# Patient Record
Sex: Female | Born: 1940 | Race: White | Hispanic: No | Smoking: Never smoker
Health system: Southern US, Community
[De-identification: ages and names within clinical notes are randomized; demographics above are authoritative.]

## PROBLEM LIST (undated history)

## (undated) DIAGNOSIS — R269 Unspecified abnormalities of gait and mobility: Secondary | ICD-10-CM

## (undated) DIAGNOSIS — H539 Unspecified visual disturbance: Secondary | ICD-10-CM

## (undated) DIAGNOSIS — M1990 Unspecified osteoarthritis, unspecified site: Secondary | ICD-10-CM

## (undated) DIAGNOSIS — M75100 Unspecified rotator cuff tear or rupture of unspecified shoulder, not specified as traumatic: Secondary | ICD-10-CM

## (undated) DIAGNOSIS — I639 Cerebral infarction, unspecified: Secondary | ICD-10-CM

## (undated) DIAGNOSIS — D649 Anemia, unspecified: Secondary | ICD-10-CM

## (undated) HISTORY — DX: Unspecified visual disturbance: H53.9

## (undated) HISTORY — DX: Cerebral infarction, unspecified: I63.9

## (undated) HISTORY — PX: ORIF CLAVICULAR FRACTURE: SHX5055

---

## 2007-07-02 ENCOUNTER — Encounter: Admission: RE | Admit: 2007-07-02 | Discharge: 2007-07-02 | Payer: Self-pay | Admitting: Obstetrics and Gynecology

## 2008-07-02 ENCOUNTER — Encounter: Admission: RE | Admit: 2008-07-02 | Discharge: 2008-07-02 | Payer: Self-pay | Admitting: Obstetrics and Gynecology

## 2009-07-04 ENCOUNTER — Encounter: Admission: RE | Admit: 2009-07-04 | Discharge: 2009-07-04 | Payer: Self-pay | Admitting: Obstetrics and Gynecology

## 2010-06-02 ENCOUNTER — Other Ambulatory Visit: Payer: Self-pay | Admitting: Obstetrics and Gynecology

## 2010-06-02 DIAGNOSIS — Z1231 Encounter for screening mammogram for malignant neoplasm of breast: Secondary | ICD-10-CM

## 2010-07-06 ENCOUNTER — Ambulatory Visit
Admission: RE | Admit: 2010-07-06 | Discharge: 2010-07-06 | Disposition: A | Discharge status: Home / Self Care | Payer: Medicare Other | Source: Ambulatory Visit | Attending: Obstetrics and Gynecology | Admitting: Obstetrics and Gynecology

## 2010-07-06 DIAGNOSIS — Z1231 Encounter for screening mammogram for malignant neoplasm of breast: Secondary | ICD-10-CM

## 2011-06-12 ENCOUNTER — Other Ambulatory Visit: Payer: Self-pay | Admitting: Obstetrics and Gynecology

## 2011-06-12 DIAGNOSIS — Z1231 Encounter for screening mammogram for malignant neoplasm of breast: Secondary | ICD-10-CM

## 2011-07-10 ENCOUNTER — Ambulatory Visit
Admission: RE | Admit: 2011-07-10 | Discharge: 2011-07-10 | Disposition: A | Discharge status: Home / Self Care | Payer: Medicare Other | Source: Ambulatory Visit | Attending: Obstetrics and Gynecology | Admitting: Obstetrics and Gynecology

## 2011-07-10 DIAGNOSIS — Z1231 Encounter for screening mammogram for malignant neoplasm of breast: Secondary | ICD-10-CM

## 2012-07-30 ENCOUNTER — Other Ambulatory Visit: Payer: Self-pay

## 2012-07-30 DIAGNOSIS — Z1231 Encounter for screening mammogram for malignant neoplasm of breast: Secondary | ICD-10-CM

## 2012-09-03 ENCOUNTER — Ambulatory Visit
Admission: RE | Admit: 2012-09-03 | Discharge: 2012-09-03 | Disposition: A | Discharge status: Home / Self Care | Payer: Medicare Other | Source: Ambulatory Visit

## 2012-09-03 DIAGNOSIS — Z1231 Encounter for screening mammogram for malignant neoplasm of breast: Secondary | ICD-10-CM

## 2013-08-28 ENCOUNTER — Other Ambulatory Visit: Payer: Self-pay

## 2013-08-28 DIAGNOSIS — Z1231 Encounter for screening mammogram for malignant neoplasm of breast: Secondary | ICD-10-CM

## 2013-09-11 ENCOUNTER — Ambulatory Visit: Payer: Medicare Other

## 2013-09-17 ENCOUNTER — Ambulatory Visit
Admission: RE | Admit: 2013-09-17 | Discharge: 2013-09-17 | Disposition: A | Discharge status: Home / Self Care | Payer: Medicare Other | Source: Ambulatory Visit

## 2013-09-17 DIAGNOSIS — Z1231 Encounter for screening mammogram for malignant neoplasm of breast: Secondary | ICD-10-CM

## 2014-08-23 ENCOUNTER — Other Ambulatory Visit: Payer: Self-pay

## 2014-08-23 DIAGNOSIS — Z1231 Encounter for screening mammogram for malignant neoplasm of breast: Secondary | ICD-10-CM

## 2014-09-21 ENCOUNTER — Ambulatory Visit
Admission: RE | Admit: 2014-09-21 | Discharge: 2014-09-21 | Disposition: A | Discharge status: Home / Self Care | Payer: Medicare Other | Source: Ambulatory Visit

## 2014-09-21 DIAGNOSIS — Z1231 Encounter for screening mammogram for malignant neoplasm of breast: Secondary | ICD-10-CM

## 2015-09-27 ENCOUNTER — Other Ambulatory Visit: Payer: Self-pay

## 2015-09-27 ENCOUNTER — Other Ambulatory Visit: Payer: Self-pay | Admitting: Family Medicine

## 2015-09-27 DIAGNOSIS — Z1231 Encounter for screening mammogram for malignant neoplasm of breast: Secondary | ICD-10-CM

## 2015-10-10 ENCOUNTER — Ambulatory Visit
Admission: RE | Admit: 2015-10-10 | Discharge: 2015-10-10 | Disposition: A | Discharge status: Home / Self Care | Payer: Medicare Other | Source: Ambulatory Visit | Attending: Family Medicine | Admitting: Family Medicine

## 2015-10-10 DIAGNOSIS — Z1231 Encounter for screening mammogram for malignant neoplasm of breast: Secondary | ICD-10-CM

## 2016-10-15 ENCOUNTER — Other Ambulatory Visit: Payer: Self-pay | Admitting: Family Medicine

## 2016-10-15 DIAGNOSIS — Z1231 Encounter for screening mammogram for malignant neoplasm of breast: Secondary | ICD-10-CM

## 2016-10-25 ENCOUNTER — Ambulatory Visit: Payer: Medicare Other

## 2016-10-30 ENCOUNTER — Ambulatory Visit
Admission: RE | Admit: 2016-10-30 | Discharge: 2016-10-30 | Disposition: A | Discharge status: Home / Self Care | Payer: Medicare Other | Source: Ambulatory Visit | Attending: Family Medicine | Admitting: Family Medicine

## 2016-10-30 DIAGNOSIS — Z1231 Encounter for screening mammogram for malignant neoplasm of breast: Secondary | ICD-10-CM

## 2016-10-31 ENCOUNTER — Other Ambulatory Visit: Payer: Self-pay | Admitting: Family Medicine

## 2016-10-31 DIAGNOSIS — R928 Other abnormal and inconclusive findings on diagnostic imaging of breast: Secondary | ICD-10-CM

## 2016-11-02 ENCOUNTER — Ambulatory Visit: Payer: Medicare Other

## 2016-11-02 ENCOUNTER — Ambulatory Visit
Admission: RE | Admit: 2016-11-02 | Discharge: 2016-11-02 | Disposition: A | Discharge status: Home / Self Care | Payer: Medicare Other | Source: Ambulatory Visit | Attending: Family Medicine | Admitting: Family Medicine

## 2016-11-02 DIAGNOSIS — R928 Other abnormal and inconclusive findings on diagnostic imaging of breast: Secondary | ICD-10-CM

## 2016-11-05 ENCOUNTER — Other Ambulatory Visit: Payer: Medicare Other

## 2016-11-06 ENCOUNTER — Other Ambulatory Visit: Payer: Medicare Other

## 2017-01-04 ENCOUNTER — Other Ambulatory Visit: Payer: Self-pay | Admitting: Physician Assistant

## 2017-01-04 DIAGNOSIS — M8580 Other specified disorders of bone density and structure, unspecified site: Secondary | ICD-10-CM

## 2017-01-28 ENCOUNTER — Ambulatory Visit
Admission: RE | Admit: 2017-01-28 | Discharge: 2017-01-28 | Disposition: A | Discharge status: Home / Self Care | Payer: Medicare Other | Source: Ambulatory Visit | Attending: Physician Assistant | Admitting: Physician Assistant

## 2017-01-28 DIAGNOSIS — M8580 Other specified disorders of bone density and structure, unspecified site: Secondary | ICD-10-CM

## 2017-10-17 ENCOUNTER — Ambulatory Visit (INDEPENDENT_AMBULATORY_CARE_PROVIDER_SITE_OTHER): Payer: Medicare Other | Admitting: Orthopaedic Surgery

## 2017-10-17 ENCOUNTER — Ambulatory Visit (INDEPENDENT_AMBULATORY_CARE_PROVIDER_SITE_OTHER): Payer: Medicare Other

## 2017-10-17 ENCOUNTER — Encounter (INDEPENDENT_AMBULATORY_CARE_PROVIDER_SITE_OTHER): Payer: Self-pay | Admitting: Orthopaedic Surgery

## 2017-10-17 DIAGNOSIS — M79621 Pain in right upper arm: Secondary | ICD-10-CM | POA: Diagnosis not present

## 2017-10-17 DIAGNOSIS — M898X2 Other specified disorders of bone, upper arm: Secondary | ICD-10-CM

## 2017-10-17 DIAGNOSIS — S42254D Nondisplaced fracture of greater tuberosity of right humerus, subsequent encounter for fracture with routine healing: Secondary | ICD-10-CM | POA: Diagnosis not present

## 2017-10-17 MED ORDER — LIDOCAINE HCL 1 % IJ SOLN
3.0000 mL | INTRAMUSCULAR | Status: AC | PRN
  Administered 2017-10-17: 3 mL

## 2017-10-17 MED ORDER — METHYLPREDNISOLONE ACETATE 40 MG/ML IJ SUSP
40.0000 mg | INTRAMUSCULAR | Status: AC | PRN
  Administered 2017-10-17: 40 mg via INTRA_ARTICULAR

## 2017-10-17 NOTE — Progress Notes (Signed)
Office Visit Note   Patient: Sarah Page           Date of Birth: December 04, 1940           MRN: [id number] Visit Date: 10/17/2017              Requested by: Selinda Flavin, MD [address] [place], [place] [address] PCP: Selinda Flavin, MD   Assessment & Plan: Visit Diagnoses:  1. Pain of right humerus   2. Closed nondisplaced fracture of greater tuberosity of right humerus with routine healing, subsequent encounter     Plan: At this point it has been 4 weeks since her injury and I do feel that it is appropriate for her to be out of the sling at this standpoint.  I want her to work on shoulder rotation but no significant abduction past 90 degrees.  She will work on [employer] well.  At this point I feel that is appropriate that we set her up for outpatient physical therapy to work on increasing her shoulder mobility as well.  I agree with her not needing any type of surgery until at least going through further healing of the greater tuberosity impaction fracture and therapy.  I do feel that she would benefit from a subacromial injection because she is has already gone through the inflammatory stage of healing I think this could help with her pain and should agree with trying something like this as well.  All questions concerns were answered and addressed.  I will see her back in 4 weeks and I would like an AP and axillary view for 2 views of the right shoulder at that visit.  All question concerns were answered and addressed.  Follow-Up Instructions: Return in about 1 month (around 11/14/2017).   Orders:  Orders Placed This Encounter  Procedures  . Large Joint Inj  . XR Humerus Right   No orders of the defined types were placed in this encounter.     Procedures: Large Joint Inj: R subacromial bursa on 10/17/2017 5:08 PM Indications: pain and diagnostic evaluation Details: 22 G 1.5 in needle  Arthrogram: No  Medications: 3 mL lidocaine 1 %; 40 mg methylPREDNISolone acetate  40 MG/ML Outcome: tolerated well, no immediate complications Procedure, treatment alternatives, risks and benefits explained, specific risks discussed. Consent was given by the patient. Immediately prior to procedure a time out was called to verify the correct patient, procedure, equipment, support staff and site/side marked as required. Patient was prepped and draped in the usual sterile fashion.       Clinical Data: No additional findings.   Subjective: Chief Complaint  Patient presents with  . Right Shoulder - Pain  The patient is a very pleasant 77 year old active right-hand dominant female who comes for second opinion as it relates to her right shoulder.  Her son-in-law is a cardiologist who I know and asked if I could see her.  She had an unfortunate mechanical fall when she was outside working in yard about a month ago.  She landed on that right shoulder and apparently sustained an impaction fracture that was nondisplaced to the right shoulder greater tuberosity.  There is likely rotator cuff tear as well.  According to the patient she was told to follow-up around August of this year did not consider surgery on her shoulder because she would need time to heal the greater tuberosity fracture.  I did tell her that that is somewhat appropriate given the fact that  surgery is not warranted on her rotator cuff until there is evidence that the greater tuberosity fracture is completely healed.  She has been in a sling for the last month and has had limited mobility of that shoulder.  She complains of mainly pain in the mid humerus and not so much the shoulder but she is concerned about her limited mobility of that shoulder at this point.  She denies any neck pain denies any numbness and tingling in her hand.  She is very active individual and this is certainly concerning for her.  I do have the x-ray report for my review and she knows that I would like to get a picture of her shoulder today as well.   Her daughter is with her as well.   HPI  Review of Systems She denies any active medical problems.  Objective: Vital Signs: There were no vitals taken for this visit.  Physical Exam She is alert and oriented x3 and in no acute distress Ortho Exam Examination of her right shoulder shows no muscle atrophy.  Her shoulder is well located.  She has significant deficits with shoulder abduction and external rotation with weakness in the shoulder as well. Specialty Comments:  No specialty comments available.  Imaging: Xr Humerus Right  Result Date: 10/17/2017 A single x-ray of the right humerus shows the shoulder is well located.  I do feel that the humeral head is slightly high riding.  He cannot see the greater tuberosity fracture that is seen on MRI.    PMFS History: Patient Active Problem List   Diagnosis Date Noted  . Nondisplaced fracture of greater tuberosity of right humerus with routine healing 10/17/2017   History reviewed. No pertinent past medical history.  Family History  Problem Relation Age of Onset  . Breast cancer Neg Hx     History reviewed. No pertinent surgical history. Social History   Occupational History  . Not on file  Tobacco Use  . Smoking status: Never Smoker  . Smokeless tobacco: Never Used  Substance and Sexual Activity  . Alcohol use: Not on file  . Drug use: Not on file  . Sexual activity: Not on file

## 2017-11-14 ENCOUNTER — Ambulatory Visit (INDEPENDENT_AMBULATORY_CARE_PROVIDER_SITE_OTHER): Payer: Medicare Other | Admitting: Orthopaedic Surgery

## 2017-11-14 ENCOUNTER — Encounter (INDEPENDENT_AMBULATORY_CARE_PROVIDER_SITE_OTHER): Payer: Self-pay | Admitting: Orthopaedic Surgery

## 2017-11-14 ENCOUNTER — Ambulatory Visit (INDEPENDENT_AMBULATORY_CARE_PROVIDER_SITE_OTHER): Payer: Medicare Other

## 2017-11-14 DIAGNOSIS — S42254D Nondisplaced fracture of greater tuberosity of right humerus, subsequent encounter for fracture with routine healing: Secondary | ICD-10-CM

## 2017-11-14 NOTE — Progress Notes (Signed)
The patient is now about 8 weeks status post injury to her right shoulder after mechanical fall.  She sustained a right proximal humerus fracture mainly involving the greater tuberosity of the shoulder.  This is follow-up for her at this point.  With that her out of sling trying to increase the activities of her right shoulder with increasing her mobility of that shoulder.  She is 77 years old.  Her injury was more significant and the fact that she had an anterior shoulder dislocation as well.  Her injury was also more of a rotator cuff tear.  She is had 3 physical therapy sessions and she reports minimal pain.  On examination her shoulder is clinically located.  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  Distally her motor and sensory exam is normal.  2 views of her right shoulder obtained and showed a fracture is consolidating nicely and the shoulder is well located.  At this point therapy can aggressively work on attempts of full motion of her right shoulder and strengthening.  I will see her back in 4 weeks to see how she is doing overall.  I would like a single AP view of her shoulder at that visit of her right shoulder at that visit.

## 2017-12-10 ENCOUNTER — Other Ambulatory Visit: Payer: Self-pay | Admitting: Family Medicine

## 2017-12-10 DIAGNOSIS — Z1231 Encounter for screening mammogram for malignant neoplasm of breast: Secondary | ICD-10-CM

## 2017-12-16 ENCOUNTER — Encounter (INDEPENDENT_AMBULATORY_CARE_PROVIDER_SITE_OTHER): Payer: Self-pay | Admitting: Orthopaedic Surgery

## 2017-12-16 ENCOUNTER — Ambulatory Visit (INDEPENDENT_AMBULATORY_CARE_PROVIDER_SITE_OTHER): Payer: Medicare Other | Admitting: Orthopaedic Surgery

## 2017-12-16 ENCOUNTER — Ambulatory Visit (INDEPENDENT_AMBULATORY_CARE_PROVIDER_SITE_OTHER): Payer: Medicare Other

## 2017-12-16 DIAGNOSIS — S42254D Nondisplaced fracture of greater tuberosity of right humerus, subsequent encounter for fracture with routine healing: Secondary | ICD-10-CM | POA: Diagnosis not present

## 2017-12-16 NOTE — Progress Notes (Signed)
Patient is a very active right-hand-dominant 77 year old who is been recovering from a greater tuberosity fracture that was nondisplaced of her right shoulder.  She also has a deficit of the rotator cuff.  She is been going to physical therapy.  She has no discomfort and minimal pain with her shoulder but her limitations have been in function of the shoulder in general.  This is mainly with overhead activities and activities of daily living.  She would like to be able to easily put on a necklace or blood draw here.  On examination passively her range of motion is full actively there is still significant limitations with right shoulder abduction and external rotation with significant weakness of the rotator cuff.  An AP single view of the right shoulder is obtained and shows the greater tuberosity fracture is healed.  The humeral head is not slightly a superior position in the glenoid showing rotator cuff deficits.  This point we will not try at least one more month of physical therapy.  I cannot guarantee that an arthroscopic intervention could improve her outcome.  However, we may consider arthroscopy after seeing how she looks at her next visit.

## 2018-01-08 ENCOUNTER — Ambulatory Visit
Admission: RE | Admit: 2018-01-08 | Discharge: 2018-01-08 | Disposition: A | Discharge status: Home / Self Care | Payer: Medicare Other | Source: Ambulatory Visit | Attending: Family Medicine | Admitting: Family Medicine

## 2018-01-08 DIAGNOSIS — Z1231 Encounter for screening mammogram for malignant neoplasm of breast: Secondary | ICD-10-CM

## 2018-01-13 ENCOUNTER — Encounter (INDEPENDENT_AMBULATORY_CARE_PROVIDER_SITE_OTHER): Payer: Self-pay | Admitting: Orthopaedic Surgery

## 2018-01-13 ENCOUNTER — Ambulatory Visit (INDEPENDENT_AMBULATORY_CARE_PROVIDER_SITE_OTHER): Payer: Medicare Other | Admitting: Orthopaedic Surgery

## 2018-01-13 DIAGNOSIS — S42254D Nondisplaced fracture of greater tuberosity of right humerus, subsequent encounter for fracture with routine healing: Secondary | ICD-10-CM

## 2018-01-13 NOTE — Progress Notes (Signed)
   Office Visit Note   Patient: Sarah Page           Date of Birth: [birth year]           MRN: [id number] Visit Date: 01/13/2018              Requested by: Selinda Flavin, MD [address] [place], [place] [address] PCP: Selinda Flavin, MD   Assessment & Plan: Visit Diagnoses:  1. Closed nondisplaced fracture of greater tuberosity of right humerus with routine healing, subsequent encounter     Plan: She will continue to work on range of motion strengthening of the right shoulder.  New prescription for physical therapy to continue to work on range of motion strengthening is given.  She does not want any type of surgical intervention at this point time I feel this is appropriate as she is making slow steady progress.  We will see her back in 3 months no radiographs at that time.  Follow-Up Instructions: Return in about 3 months (around 04/14/2018).   Orders:  No orders of the defined types were placed in this encounter.  No orders of the defined types were placed in this encounter.     Procedures: No procedures performed   Clinical Data: No additional findings.   Subjective: Chief Complaint  Patient presents with  . Right Shoulder - Follow-up    HPI Sarah Page returns today follow-up of her right shoulder nondisplaced greater tuberosity fracture.  She states she is making slow progress with physical therapy.  She feels therapy is definitely been helpful.  She has weakness of the right arm and has difficulty lifting anything with the arm..  Again she fractured her right shoulder in early June.  Review of Systems   Objective: Vital Signs: There were no vitals taken for this visit.  Physical Exam  Constitutional: She is oriented to person, place, and time. She appears well-developed and well-nourished. No distress.  Pulmonary/Chest: Effort normal.  Neurological: She is alert and oriented to person, place, and time.  Skin: She is not diaphoretic.    Ortho Exam Forward flexion  actively 100 degrees passively and bring him to approximately 150 to 160 degrees.  She has weakness with external rotation right shoulder against resistance 5 out of 5 strength with internal rotation against resistance.  Negative impingement testing on the right. Specialty Comments:  No specialty comments available.  Imaging: No results found.   PMFS History: Patient Active Problem List   Diagnosis Date Noted  . Nondisplaced fracture of greater tuberosity of right humerus with routine healing 10/17/2017   No past medical history on file.  Family History  Problem Relation Age of Onset  . Breast cancer Neg Hx     No past surgical history on file. Social History   Occupational History  . Not on file  Tobacco Use  . Smoking status: Never Smoker  . Smokeless tobacco: Never Used  Substance and Sexual Activity  . Alcohol use: Not on file  . Drug use: Not on file  . Sexual activity: Not on file

## 2018-03-07 ENCOUNTER — Encounter (HOSPITAL_COMMUNITY): Payer: Self-pay

## 2018-03-07 ENCOUNTER — Emergency Department (HOSPITAL_COMMUNITY): Payer: Medicare Other

## 2018-03-07 ENCOUNTER — Emergency Department (HOSPITAL_COMMUNITY)
Admission: EM | Admit: 2018-03-07 | Discharge: 2018-03-07 | Disposition: A | Discharge status: Home / Self Care | Payer: Medicare Other | Attending: Emergency Medicine | Admitting: Emergency Medicine

## 2018-03-07 ENCOUNTER — Other Ambulatory Visit: Payer: Self-pay

## 2018-03-07 DIAGNOSIS — Z79899 Other long term (current) drug therapy: Secondary | ICD-10-CM | POA: Diagnosis not present

## 2018-03-07 DIAGNOSIS — N390 Urinary tract infection, site not specified: Secondary | ICD-10-CM | POA: Insufficient documentation

## 2018-03-07 DIAGNOSIS — R5383 Other fatigue: Secondary | ICD-10-CM | POA: Diagnosis present

## 2018-03-07 HISTORY — DX: Unspecified osteoarthritis, unspecified site: M19.90

## 2018-03-07 HISTORY — DX: Anemia, unspecified: D64.9

## 2018-03-07 HISTORY — DX: Unspecified rotator cuff tear or rupture of unspecified shoulder, not specified as traumatic: M75.100

## 2018-03-07 LAB — I-STAT TROPONIN, ED: Troponin i, poc: 0.03 ng/mL (ref 0.00–0.08)

## 2018-03-07 LAB — BASIC METABOLIC PANEL
ANION GAP: 8 (ref 5–15)
BUN: 18 mg/dL (ref 8–23)
CALCIUM: 9.2 mg/dL (ref 8.9–10.3)
CO2: 27 mmol/L (ref 22–32)
Chloride: 107 mmol/L (ref 98–111)
Creatinine, Ser: 0.98 mg/dL (ref 0.44–1.00)
GFR, EST NON AFRICAN AMERICAN: 55 mL/min — AB (ref >60)
Glucose, Bld: 132 mg/dL — ABNORMAL HIGH (ref 70–99)
Potassium: 3.8 mmol/L (ref 3.5–5.1)
Sodium: 142 mmol/L (ref 135–145)

## 2018-03-07 LAB — URINALYSIS, ROUTINE W REFLEX MICROSCOPIC
Bilirubin Urine: NEGATIVE
GLUCOSE, UA: NEGATIVE mg/dL
HGB URINE DIPSTICK: NEGATIVE
KETONES UR: NEGATIVE mg/dL
Nitrite: NEGATIVE
PROTEIN: NEGATIVE mg/dL
Specific Gravity, Urine: 1.006 (ref 1.005–1.030)
pH: 7 (ref 5.0–8.0)

## 2018-03-07 LAB — CBC
HCT: 37.4 % (ref 36.0–46.0)
Hemoglobin: 11.2 g/dL — ABNORMAL LOW (ref 12.0–15.0)
MCH: 27.3 pg (ref 26.0–34.0)
MCHC: 29.9 g/dL — ABNORMAL LOW (ref 30.0–36.0)
MCV: 91 fL (ref 80.0–100.0)
NRBC: 0 % (ref 0.0–0.2)
Platelets: 372 10*3/uL (ref 150–400)
RBC: 4.11 MIL/uL (ref 3.87–5.11)
RDW: 15.1 % (ref 11.5–15.5)
WBC: 8.7 10*3/uL (ref 4.0–10.5)

## 2018-03-07 LAB — CBG MONITORING, ED: Glucose-Capillary: 138 mg/dL — ABNORMAL HIGH (ref 70–99)

## 2018-03-07 LAB — TROPONIN I: Troponin I: <0.03 ng/mL (ref <0.03)

## 2018-03-07 MED ORDER — SODIUM CHLORIDE 0.9 % IV BOLUS (SEPSIS)
500.0000 mL | Freq: Once | INTRAVENOUS | Status: AC
  Administered 2018-03-07: 500 mL via INTRAVENOUS

## 2018-03-07 MED ORDER — SODIUM CHLORIDE 0.9 % IV SOLN
1.0000 g | Freq: Once | INTRAVENOUS | Status: AC
  Administered 2018-03-07: 1 g via INTRAVENOUS
  Filled 2018-03-07: qty 10

## 2018-03-07 MED ORDER — SODIUM CHLORIDE 0.9 % IV SOLN
1000.0000 mL | INTRAVENOUS | Status: DC
  Administered 2018-03-07: 1000 mL via INTRAVENOUS

## 2018-03-07 NOTE — ED Provider Notes (Signed)
[facility] EMERGENCY DEPARTMENT Provider Note   CSN: [id number] Arrival date & time: 03/07/18  1643     History   Chief Complaint Chief Complaint  Patient presents with  . Urinary Tract Infection  . Fatigue    HPI Sarah Page is a 77 y.o. female.  HPI Pt presents to the ED for evaluation of fatigue and generalized weakness.  Pt almost  fell when she has been standing up a couple of times today.  She was also in the shower yesterday and had difficulty getting up when she was leaning forward.  Patient feels that both of her legs are weak.  She denies any trouble with extremity pain, vision issues or speech issues.  She denies any trouble with chest pain or shortness of breath.  No abdominal pain.  No blood in her stool.  Patient saw her doctor a couple of days ago and was diagnosed with UTI and was given a prescription for amoxicillin.  She denies any fevers. Past Medical History:  Diagnosis Date  . Anemia   . Arthritis   . Rotator cuff tear    DR [facility]    Patient Active Problem List   Diagnosis Date Noted  . Nondisplaced fracture of greater tuberosity of right humerus with routine healing 10/17/2017    History reviewed. No pertinent surgical history.   OB History   None      Home Medications    Prior to Admission medications   Medication Sig Start Date End Date Taking? Authorizing Provider  amoxicillin (AMOXIL) 500 MG capsule Take 1,000 mg by mouth 2 (two) times daily. 7 day course starting on 03/06/2018 03/06/18  Yes [provider]  Calcium Carbonate (CALCARB 600 PO) Take 1 tablet by mouth every morning.   Yes [provider]  Cholecalciferol (VITAMIN D3 PO) Take 1 capsule by mouth every morning.   Yes [provider]  ferrous sulfate 325 (65 FE) MG tablet Take 325 mg by mouth daily with breakfast.   Yes [provider]  KRILL OIL PO Take 1 capsule by mouth every morning.   Yes [provider]  loratadine  (CLARITIN) 10 MG tablet Take 10 mg by mouth every morning.   Yes [provider]  Multiple Vitamin (MULTIVITAMIN WITH MINERALS) TABS tablet Take 1 tablet by mouth every morning.   Yes [provider]  naproxen sodium (ALEVE) 220 MG tablet Take 440 mg by mouth 2 (two) times daily as needed (FOR PAIN).   Yes [provider]    Family History Family History  Problem Relation Age of Onset  . Breast cancer Neg Hx     Social History Social History   Tobacco Use  . Smoking status: Never Smoker  . Smokeless tobacco: Never Used  Substance Use Topics  . Alcohol use: Yes    Comment: ONE GLASS DAILY  . Drug use: Never     Allergies   Patient has no known allergies.   Review of Systems Review of Systems  All other systems reviewed and are negative.    Physical Exam Updated Vital Signs BP (!) 143/77 (BP Location: Left Arm)   Pulse 75   Temp 98 F (36.7 C)   Resp 18   Ht 1.651 m (5\' 5" )   Wt 70.3 kg   SpO2 96%   BMI 25.79 kg/m   Physical Exam  Constitutional: She is oriented to person, place, and time. She appears well-developed and well-nourished. No distress.  HENT:  Head:  Normocephalic and atraumatic.  Right Ear: External ear normal.  Left Ear: External ear normal.  Mouth/Throat: Oropharynx is clear and moist.  Eyes: Conjunctivae are normal. Right eye exhibits no discharge. Left eye exhibits no discharge. No scleral icterus.  Neck: Neck supple. No tracheal deviation present.  Cardiovascular: Normal rate, regular rhythm and intact distal pulses.  Pulmonary/Chest: Effort normal and breath sounds normal. No stridor. No respiratory distress. She has no wheezes. She has no rales.  Abdominal: Soft. Bowel sounds are normal. She exhibits no distension. There is no tenderness. There is no rebound and no guarding.  Musculoskeletal: She exhibits no edema or tenderness.  Neurological: She is alert and oriented to person, place, and time. She has normal  strength. No cranial nerve deficit (No facial droop, extraocular movements intact, tongue midline ) or sensory deficit. She exhibits normal muscle tone. She displays no seizure activity. Coordination normal.  No pronator drift bilateral upper extrem, able to hold both legs off bed for 5 seconds, sensation intact in all extremities, no visual field cuts, no left or right sided neglect, normal finger-nose exam bilaterally, no nystagmus noted   Skin: Skin is warm and dry. No rash noted.  Psychiatric: She has a normal mood and affect.  Nursing note and vitals reviewed.    ED Treatments / Results  Labs (all labs ordered are listed, but only abnormal results are displayed) Labs Reviewed  BASIC METABOLIC PANEL - Abnormal; Notable for the following components:      Result Value   Glucose, Bld 132 (*)    GFR calc non Af Amer 55 (*)    All other components within normal limits  CBC - Abnormal; Notable for the following components:   Hemoglobin 11.2 (*)    MCHC 29.9 (*)    All other components within normal limits  URINALYSIS, ROUTINE W REFLEX MICROSCOPIC - Abnormal; Notable for the following components:   Color, Urine STRAW (*)    Leukocytes, UA MODERATE (*)    Bacteria, UA RARE (*)    All other components within normal limits  CBG MONITORING, ED - Abnormal; Notable for the following components:   Glucose-Capillary 138 (*)    All other components within normal limits  TROPONIN I  I-STAT TROPONIN, ED    EKG EKG Interpretation  Date/Time:  Friday March 07 2018 17:15:52 EST Ventricular Rate:  87 PR Interval:    QRS Duration: 83 QT Interval:  369 QTC Calculation: 444 R Axis:   50 Text Interpretation:  Sinus rhythm Minimal ST depression, lateral leads Baseline wander in lead(s) V3 No old tracing to compare Confirmed by Linwood Dibbles [id number]) on 03/07/2018 5:22:10 PM   Radiology Dg Chest 2 View  Result Date: 03/07/2018 CLINICAL DATA:  Weakness, fall EXAM: CHEST - 2 VIEW COMPARISON:   None. FINDINGS: Lungs are essentially clear. Mild left basilar opacity, likely atelectasis. No pleural effusion or pneumothorax. The heart is normal in size. Moderate hiatal hernia. Visualized osseous structures are within normal limits. IMPRESSION: No evidence of acute cardiopulmonary disease. Electronically Signed   By: Charline Bills M.D.   On: 03/07/2018 19:38    Procedures Procedures (including critical care time)  Medications Ordered in ED Medications  sodium chloride 0.9 % bolus 500 mL (0 mLs Intravenous Stopped 03/07/18 1818)    Followed by  0.9 %  sodium chloride infusion (1,000 mLs Intravenous New Bag/Given 03/07/18 1747)  cefTRIAXone (ROCEPHIN) 1 g in sodium chloride 0.9 % 100 mL IVPB (1 g Intravenous New Bag/Given 03/07/18  2037)     Initial Impression / Assessment and Plan / ED Course  I have reviewed the triage vital signs and the nursing notes.  Pertinent labs & imaging results that were available during my care of the patient were reviewed by me and considered in my medical decision making (see chart for details).   Patient presented to the emergency room with complaints of generalized weakness.  She was recently diagnosed with urinary tract infection.  In the ED the patient's vital signs were reassuring.  She had a normal neurologic exam without any focal deficits.  Initially she was hypertensive but subsequent blood pressures were more normotensive.  She was not orthostatic.  Lab tests do show urinalysis consistent with urinary tract infection.  No signs of severe dehydration or anemia.  Patient is currently on antibiotics.  She has a urine culture pending that per Dr. said would be available tomorrow.  I covered her with a dose of Rocephin until her sensitivities come back.  Patient was able to walk around the emergency room without difficulty.  She appears stable for discharge.  Final Clinical Impressions(s) / ED Diagnoses   Final diagnoses:  Lower urinary tract  infectious disease    ED Discharge Orders    None       Linwood Dibbles, MD 03/07/18 2055

## 2018-03-07 NOTE — Discharge Instructions (Addendum)
Continue your antibiotics, make sure to stay hydrated, return for fever, worsening symptoms

## 2018-03-07 NOTE — ED Triage Notes (Signed)
Pt went to DR [facility] due to urinary frequency and was given amoxicillin. Pt reports that she was in the shower yesterday and leaned forward and had difficulty getting up. Pt reports that she has fallen twice today and feels very weak.

## 2018-03-07 NOTE — ED Notes (Signed)
Pt wheeled to waiting room. Pt verbalized understanding of discharge instructions.   

## 2018-03-10 ENCOUNTER — Encounter: Payer: Self-pay | Admitting: Neurology

## 2018-03-12 ENCOUNTER — Ambulatory Visit: Payer: Medicare Other | Admitting: Neurology

## 2018-03-12 ENCOUNTER — Encounter: Payer: Self-pay | Admitting: Neurology

## 2018-03-12 ENCOUNTER — Other Ambulatory Visit: Payer: Self-pay

## 2018-03-12 VITALS — BP 150/70 | HR 80 | Resp 16 | Ht 65.0 in | Wt 140.0 lb

## 2018-03-12 DIAGNOSIS — I63031 Cerebral infarction due to thrombosis of right carotid artery: Secondary | ICD-10-CM | POA: Diagnosis not present

## 2018-03-12 DIAGNOSIS — R269 Unspecified abnormalities of gait and mobility: Secondary | ICD-10-CM

## 2018-03-12 DIAGNOSIS — N3000 Acute cystitis without hematuria: Secondary | ICD-10-CM

## 2018-03-12 NOTE — Progress Notes (Signed)
Reason for visit: Stroke  Referring physician: Dr. [name] is a 77 y.o. female  History of present illness:  [name] is a 77 year old right-handed white female with a history of a recent alteration in her functional status.  The patient around 03 March 2018 began to feel poorly, she was very fatigued and had some malaise.  The patient eventually went to the emergency room on 07 March 2018 and was found to have a urinary tract infection.  She was placed on antibiotics, and an eventual urine culture confirmed an E. coli UTI.  The patient however was noted to have some alteration in her walking, she was falling frequently.  For this reason, a MRI of the brain was done showing extensive chronic small vessel ischemic changes but the patient also had a right centrum semiovale stroke event there was subacute.  The patient does not have a prior history of hypertension, dyslipidemia, or diabetes.  The patient does not smoke cigarettes.  She reported some slight difficulty with elevating the left arm, she has had some improvement in her walking but she still is off balance.  She fell yesterday.  She denies any headache, speech changes, difficulty swallowing, and no problems with confusion.  She has continued to have ongoing urinary urgency.  The patient has been placed on Plavix, she was sent to this office for an evaluation.  She is not yet in physical therapy.  Past Medical History:  Diagnosis Date  . Anemia   . Arthritis   . Rotator cuff tear    DR Magnus Ivan  . Stroke (HCC)   . Vision abnormalities     Past Surgical History:  Procedure Laterality Date  . ORIF CLAVICULAR FRACTURE Left     Family History  Problem Relation Age of Onset  . Stroke Mother   . Transient ischemic attack Mother   . Stroke Father   . Heart attack Father   . Mental illness Brother   . Breast cancer Neg Hx     Social history:  reports that she has never smoked. She has never used smokeless  tobacco. She reports that she drinks alcohol. She reports that she does not use drugs.  Medications:  Prior to Admission medications   Medication Sig Start Date End Date Taking? Authorizing Provider  amoxicillin (AMOXIL) 500 MG capsule Take 1,000 mg by mouth 2 (two) times daily. 7 day course starting on 03/06/2018 03/06/18  Yes [provider]  Calcium Carbonate (CALCARB 600 PO) Take 1 tablet by mouth every morning.   Yes [provider]  Cholecalciferol (VITAMIN D3 PO) Take 1 capsule by mouth every morning.   Yes [provider]  ferrous sulfate 325 (65 FE) MG tablet Take 325 mg by mouth daily with breakfast.   Yes [provider]  KRILL OIL PO Take 1 capsule by mouth every morning.   Yes [provider]  loratadine (CLARITIN) 10 MG tablet Take 10 mg by mouth every morning.   Yes [provider]  Multiple Vitamin (MULTIVITAMIN WITH MINERALS) TABS tablet Take 1 tablet by mouth every morning.   Yes [provider]  naproxen sodium (ALEVE) 220 MG tablet Take 440 mg by mouth 2 (two) times daily as needed (FOR PAIN).   Yes [provider]  clopidogrel (PLAVIX) 75 MG tablet  03/10/18   [provider]     No Known Allergies  ROS:  Out of a complete 14 system review of symptoms, the  patient complains only of the following symptoms, and all other reviewed systems are negative.  Fatigue Incontinence of the bladder, urinary problems Weakness Decreased energy, change in appetite Restless legs  Blood pressure (!) 150/70, pulse 80, resp. rate 16, height 5\' 5"  (1.651 m), weight 140 lb (63.5 kg).  Physical Exam  General: The patient is alert and cooperative at the time of the examination.  Slightly flat affect is noted.  Eyes: Pupils are equal, round, and reactive to light. Discs are flat bilaterally.  Neck: The neck is supple, no carotid bruits are noted.  Respiratory: The respiratory examination is  clear.  Cardiovascular: The cardiovascular examination reveals a regular rate and rhythm, no obvious murmurs or rubs are noted.  Skin: Extremities are without significant edema.  Neurologic Exam  Mental status: The patient is alert and oriented x 3 at the time of the examination. The patient has apparent normal recent and remote memory, with an apparently normal attention span and concentration ability.  Cranial nerves: Facial symmetry is present. There is good sensation of the face to pinprick and soft touch bilaterally. The strength of the facial muscles and the muscles to head turning and shoulder shrug are normal bilaterally. Speech is well enunciated, no aphasia or dysarthria is noted. Extraocular movements are full. Visual fields are full. The tongue is midline, and the patient has symmetric elevation of the soft palate. No obvious hearing deficits are noted.  Motor: The motor testing reveals 5 over 5 strength of all 4 extremities. Good symmetric motor tone is noted throughout.  Sensory: Sensory testing is intact to pinprick, soft touch, vibration sensation, and position sense on all 4 extremities. No evidence of extinction is noted.  Coordination: Cerebellar testing reveals good finger-nose-finger and heel-to-shin bilaterally.  Gait and station: Gait is minimally wide-based, the patient slightly drags the left leg with walking.  Tandem gait is slightly unsteady. Romberg is negative. No drift is seen.  Reflexes: Deep tendon reflexes are symmetric and normal bilaterally. Toes are downgoing bilaterally.   Assessment/Plan:  1.  Cerebrovascular disease, recent right centrum semiovale stroke  2.  Gait disturbance  The patient will remain on Plavix, and we will set her up for 2D echocardiogram and a carotid Doppler study.  The patient will undergo some physical therapy for gait.  She will follow-up here in about 3 months.  We may consider in the future a 30 day cardiac monitor study if  the above work-up is unremarkable.  MD 03/12/2018 2:32 PM  [facility] [address] [place], [place]  Phone [phone] Fax [phone]

## 2018-03-13 ENCOUNTER — Telehealth: Payer: Self-pay | Admitting: Neurology

## 2018-03-13 LAB — URINALYSIS, ROUTINE W REFLEX MICROSCOPIC
BILIRUBIN UA: NEGATIVE
Glucose, UA: NEGATIVE
Ketones, UA: NEGATIVE
LEUKOCYTES UA: NEGATIVE
Nitrite, UA: NEGATIVE
PH UA: 6 (ref 5.0–7.5)
PROTEIN UA: NEGATIVE
RBC, UA: NEGATIVE
Specific Gravity, UA: 1.022 (ref 1.005–1.030)
Urobilinogen, Ur: 0.2 mg/dL (ref 0.2–1.0)

## 2018-03-13 MED ORDER — MIRABEGRON ER 25 MG PO TB24: 25.0000 mg | ORAL_TABLET | Freq: Every day | ORAL | 3 refills | Status: DC

## 2018-03-13 NOTE — Telephone Encounter (Signed)
I called the patient.  A repeat urinalysis showed good resolution of the urinary tract infection.  The patient is still having a lot of urinary frequency, this may be related to the stroke itself.  I will add Myrbetriq to her regimen, we will start on the 25 mg dose, if she needs a higher dose, she is to contact our office.

## 2018-03-27 ENCOUNTER — Ambulatory Visit (INDEPENDENT_AMBULATORY_CARE_PROVIDER_SITE_OTHER): Payer: Medicare Other

## 2018-03-27 ENCOUNTER — Other Ambulatory Visit: Payer: Self-pay

## 2018-03-27 ENCOUNTER — Telehealth: Payer: Self-pay | Admitting: Neurology

## 2018-03-27 DIAGNOSIS — I63031 Cerebral infarction due to thrombosis of right carotid artery: Secondary | ICD-10-CM

## 2018-03-27 NOTE — Telephone Encounter (Signed)
I called the patient.  The carotid Doppler study and 2D echocardiogram are unremarkable, the patient will continue the with the Plavix therapy for now, I discussed the results with her.   Carotid doppler 03/27/18:  Summary: Right Carotid: There is no evidence of stenosis in the right ICA.  Left Carotid: There is no evidence of stenosis in the left ICA.  Vertebrals: Bilateral vertebral arteries demonstrate antegrade flow. Subclavians: Normal flow hemodynamics were seen in bilateral subclavian       arteries.   2D echo 03/27/18:  Study Conclusions  - Left ventricle: The cavity size was normal. Wall thickness was at the upper limits of normal. Systolic function was normal. The estimated ejection fraction was in the range of 60% to 65%. Wall motion was normal; there were no regional wall motion abnormalities. Doppler parameters are consistent with abnormal left ventricular relaxation (grade 1 diastolic dysfunction). - Aortic valve: There was mild regurgitation. - Mitral valve: There was trivial regurgitation. - Left atrium: The atrium was mildly dilated. - Right atrium: Central venous pressure (est): 3 mm Hg. - Atrial septum: No defect or patent foramen ovale was identified. - Tricuspid valve: There was mild regurgitation. - Pulmonary arteries: PA peak pressure: 27 mm Hg (S). - Pericardium, extracardiac: There was no pericardial effusion.

## 2018-04-04 ENCOUNTER — Other Ambulatory Visit: Payer: Self-pay

## 2018-04-04 ENCOUNTER — Observation Stay (HOSPITAL_COMMUNITY)
Admission: EM | Admit: 2018-04-04 | Discharge: 2018-04-05 | Disposition: A | Discharge status: Home / Self Care | Payer: Medicare Other | Attending: Internal Medicine | Admitting: Internal Medicine

## 2018-04-04 ENCOUNTER — Emergency Department (HOSPITAL_COMMUNITY): Payer: Medicare Other

## 2018-04-04 ENCOUNTER — Encounter (HOSPITAL_COMMUNITY): Payer: Self-pay | Admitting: Emergency Medicine

## 2018-04-04 DIAGNOSIS — D72829 Elevated white blood cell count, unspecified: Secondary | ICD-10-CM | POA: Diagnosis present

## 2018-04-04 DIAGNOSIS — Z7902 Long term (current) use of antithrombotics/antiplatelets: Secondary | ICD-10-CM | POA: Diagnosis not present

## 2018-04-04 DIAGNOSIS — E860 Dehydration: Secondary | ICD-10-CM | POA: Diagnosis present

## 2018-04-04 DIAGNOSIS — Z9181 History of falling: Secondary | ICD-10-CM | POA: Insufficient documentation

## 2018-04-04 DIAGNOSIS — K5792 Diverticulitis of intestine, part unspecified, without perforation or abscess without bleeding: Secondary | ICD-10-CM | POA: Diagnosis not present

## 2018-04-04 DIAGNOSIS — Z79899 Other long term (current) drug therapy: Secondary | ICD-10-CM | POA: Insufficient documentation

## 2018-04-04 DIAGNOSIS — Z8673 Personal history of transient ischemic attack (TIA), and cerebral infarction without residual deficits: Secondary | ICD-10-CM

## 2018-04-04 DIAGNOSIS — R32 Unspecified urinary incontinence: Secondary | ICD-10-CM | POA: Diagnosis present

## 2018-04-04 DIAGNOSIS — N179 Acute kidney failure, unspecified: Secondary | ICD-10-CM | POA: Diagnosis not present

## 2018-04-04 DIAGNOSIS — R531 Weakness: Secondary | ICD-10-CM | POA: Diagnosis present

## 2018-04-04 DIAGNOSIS — J309 Allergic rhinitis, unspecified: Secondary | ICD-10-CM | POA: Diagnosis not present

## 2018-04-04 HISTORY — DX: Unspecified abnormalities of gait and mobility: R26.9

## 2018-04-04 LAB — URINALYSIS, ROUTINE W REFLEX MICROSCOPIC
Bacteria, UA: NONE SEEN
Bilirubin Urine: NEGATIVE
Glucose, UA: NEGATIVE mg/dL
HGB URINE DIPSTICK: NEGATIVE
Ketones, ur: 5 mg/dL — AB
Nitrite: NEGATIVE
PH: 6 (ref 5.0–8.0)
Protein, ur: 30 mg/dL — AB
SPECIFIC GRAVITY, URINE: 1.015 (ref 1.005–1.030)

## 2018-04-04 LAB — MAGNESIUM: Magnesium: 2.2 mg/dL (ref 1.7–2.4)

## 2018-04-04 LAB — BASIC METABOLIC PANEL
Anion gap: 10 (ref 5–15)
BUN: 17 mg/dL (ref 8–23)
CHLORIDE: 100 mmol/L (ref 98–111)
CO2: 26 mmol/L (ref 22–32)
CREATININE: 1.21 mg/dL — AB (ref 0.44–1.00)
Calcium: 9.1 mg/dL (ref 8.9–10.3)
GFR calc Af Amer: 51 mL/min — ABNORMAL LOW (ref >60)
GFR calc non Af Amer: 44 mL/min — ABNORMAL LOW (ref >60)
Glucose, Bld: 107 mg/dL — ABNORMAL HIGH (ref 70–99)
Potassium: 3.8 mmol/L (ref 3.5–5.1)
SODIUM: 136 mmol/L (ref 135–145)

## 2018-04-04 LAB — CBC WITH DIFFERENTIAL/PLATELET
Abs Immature Granulocytes: 0.06 10*3/uL (ref 0.00–0.07)
Basophils Absolute: 0.1 10*3/uL (ref 0.0–0.1)
Basophils Relative: 0 %
EOS PCT: 0 %
Eosinophils Absolute: 0 10*3/uL (ref 0.0–0.5)
HEMATOCRIT: 34.5 % — AB (ref 36.0–46.0)
HEMOGLOBIN: 10.3 g/dL — AB (ref 12.0–15.0)
Immature Granulocytes: 0 %
LYMPHS ABS: 0.8 10*3/uL (ref 0.7–4.0)
LYMPHS PCT: 6 %
MCH: 26.3 pg (ref 26.0–34.0)
MCHC: 29.9 g/dL — AB (ref 30.0–36.0)
MCV: 88 fL (ref 80.0–100.0)
MONO ABS: 1.1 10*3/uL — AB (ref 0.1–1.0)
MONOS PCT: 8 %
Neutro Abs: 11.3 10*3/uL — ABNORMAL HIGH (ref 1.7–7.7)
Neutrophils Relative %: 86 %
Platelets: 306 10*3/uL (ref 150–400)
RBC: 3.92 MIL/uL (ref 3.87–5.11)
RDW: 14.4 % (ref 11.5–15.5)
WBC: 13.4 10*3/uL — ABNORMAL HIGH (ref 4.0–10.5)
nRBC: 0 % (ref 0.0–0.2)

## 2018-04-04 LAB — TSH: TSH: 2.208 u[IU]/mL (ref 0.350–4.500)

## 2018-04-04 LAB — PHOSPHORUS: Phosphorus: 2.8 mg/dL (ref 2.5–4.6)

## 2018-04-04 MED ORDER — HEPARIN SODIUM (PORCINE) 5000 UNIT/ML IJ SOLN
5000.0000 [IU] | Freq: Three times a day (TID) | INTRAMUSCULAR | Status: DC
  Administered 2018-04-04 – 2018-04-05 (×2): 5000 [IU] via SUBCUTANEOUS
  Filled 2018-04-04 (×3): qty 1

## 2018-04-04 MED ORDER — ADULT MULTIVITAMIN W/MINERALS CH
1.0000 | ORAL_TABLET | Freq: Every morning | ORAL | Status: DC
  Administered 2018-04-05: 1 via ORAL
  Filled 2018-04-04: qty 1

## 2018-04-04 MED ORDER — LORATADINE 10 MG PO TABS
10.0000 mg | ORAL_TABLET | Freq: Every morning | ORAL | Status: DC
  Administered 2018-04-05: 10 mg via ORAL
  Filled 2018-04-04: qty 1

## 2018-04-04 MED ORDER — METRONIDAZOLE IN NACL 5-0.79 MG/ML-% IV SOLN
500.0000 mg | Freq: Three times a day (TID) | INTRAVENOUS | Status: DC
  Administered 2018-04-04 – 2018-04-05 (×2): 500 mg via INTRAVENOUS
  Filled 2018-04-04 (×2): qty 100

## 2018-04-04 MED ORDER — ONDANSETRON HCL 4 MG PO TABS: 4.0000 mg | ORAL_TABLET | Freq: Four times a day (QID) | ORAL | Status: DC | PRN

## 2018-04-04 MED ORDER — FERROUS SULFATE 325 (65 FE) MG PO TABS
325.0000 mg | ORAL_TABLET | Freq: Every day | ORAL | Status: DC
  Administered 2018-04-05: 325 mg via ORAL
  Filled 2018-04-04: qty 1

## 2018-04-04 MED ORDER — CIPROFLOXACIN IN D5W 400 MG/200ML IV SOLN
400.0000 mg | Freq: Once | INTRAVENOUS | Status: AC
  Administered 2018-04-04: 400 mg via INTRAVENOUS
  Filled 2018-04-04: qty 200

## 2018-04-04 MED ORDER — SODIUM CHLORIDE 0.9 % IV SOLN
INTRAVENOUS | Status: DC
  Administered 2018-04-04: 15:00:00 via INTRAVENOUS

## 2018-04-04 MED ORDER — ONDANSETRON HCL 4 MG/2ML IJ SOLN: 4.0000 mg | Freq: Four times a day (QID) | INTRAMUSCULAR | Status: DC | PRN

## 2018-04-04 MED ORDER — ACETAMINOPHEN 650 MG RE SUPP: 650.0000 mg | Freq: Four times a day (QID) | RECTAL | Status: DC | PRN

## 2018-04-04 MED ORDER — METRONIDAZOLE IN NACL 5-0.79 MG/ML-% IV SOLN
500.0000 mg | Freq: Once | INTRAVENOUS | Status: AC
  Administered 2018-04-04: 500 mg via INTRAVENOUS
  Filled 2018-04-04: qty 100

## 2018-04-04 MED ORDER — CLOPIDOGREL BISULFATE 75 MG PO TABS
75.0000 mg | ORAL_TABLET | Freq: Every day | ORAL | Status: DC
  Administered 2018-04-05: 75 mg via ORAL
  Filled 2018-04-04: qty 1

## 2018-04-04 MED ORDER — ACETAMINOPHEN 325 MG PO TABS: 650.0000 mg | ORAL_TABLET | Freq: Four times a day (QID) | ORAL | Status: DC | PRN

## 2018-04-04 MED ORDER — MORPHINE SULFATE (PF) 2 MG/ML IV SOLN: 1.0000 mg | Freq: Four times a day (QID) | INTRAVENOUS | Status: DC | PRN

## 2018-04-04 MED ORDER — MIRABEGRON ER 25 MG PO TB24
25.0000 mg | ORAL_TABLET | Freq: Every day | ORAL | Status: DC
  Administered 2018-04-05: 25 mg via ORAL
  Filled 2018-04-04: qty 1

## 2018-04-04 NOTE — ED Notes (Signed)
Patient reported to have had a stroke 3 weeks ago. Patient has been having urinary incontinence x 1 week. Patient also reports diarrhea and lethargy. Neuro assessment normal at this time.

## 2018-04-04 NOTE — H&P (Signed)
History and Physical    Sarah Page XTK:[id number] DOB: 07/29/1940 DOA: 04/04/2018  Referring MD/NP/PA: Dr. Clarene Duke PCP: Selinda Flavin, MD  Patient coming from: Home  Chief Complaint: Generalized weakness, decreased appetite, diarrhea and incontinence.  HPI: Sarah Page is a 77 y.o. female with a past medical history significant for chronic anemia, recent stroke with mild left residual weakness as a deficit, degenerative arthritis, allergic rhinitis and osteopenia; who presented to the emergency department secondary to generalized weakness, decreased appetite, diarrhea and Incontinence.  Patient reported that approximately 3 weeks ago she experienced acute stroke that has left her with some mild residual weakness on her left side; subsequently she started experiencing some incontinence and was treated for UTI; symptoms failed to improved and was though to be associated with residual incontinence from stroke; patient started on Myrbetriq with some improvement. She continue feeling weak and noticed ongoing diarrhea and decrease appetite. On day of admission she was so weak, that slid out of bed and has trouble getting up. Patient was brought to ER for further evaluation and management.  Patient reported some chills, intermittent nausea and just not feeling good.  Patient denies chest pain, shortness of breath, vomiting, melena, hematochezia, fever, chills, new focal weakness, headache or any other complaints.  In ED work-up demonstrated no infection in her urine, elevated WBCs, acute kidney injury with a creatinine up to 1.26 and a CT scan of her abdomen that demonstrated acute sigmoid diverticulitis.  Patient was started on IV fluids, Cipro and Flagyl was initiated and [facility] has been contacted to place patient in observation for further evaluation and management.  Past Medical/Surgical History: Past Medical History:  Diagnosis Date  . Anemia   . Arthritis   . Gait disturbance   . Rotator cuff  tear    DR Magnus Ivan  . Stroke (HCC)   . Vision abnormalities     Past Surgical History:  Procedure Laterality Date  . ORIF CLAVICULAR FRACTURE Left     Social History:  reports that she has never smoked. She has never used smokeless tobacco. She reports current alcohol use. She reports that she does not use drugs.  Allergies: No Known Allergies  Family History:  Family History  Problem Relation Age of Onset  . Stroke Mother   . Transient ischemic attack Mother   . Stroke Father   . Heart attack Father   . Mental illness Brother   . Breast cancer Neg Hx     Prior to Admission medications   Medication Sig Start Date End Date Taking? Authorizing Provider  Calcium Carbonate (CALCARB 600 PO) Take 1 tablet by mouth every morning.    [provider]  Cholecalciferol (VITAMIN D3 PO) Take 1 capsule by mouth every morning.    [provider]  clopidogrel (PLAVIX) 75 MG tablet  03/10/18   [provider]  ferrous sulfate 325 (65 FE) MG tablet Take 325 mg by mouth daily with breakfast.    [provider]  KRILL OIL PO Take 1 capsule by mouth every morning.    [provider]  loratadine (CLARITIN) 10 MG tablet Take 10 mg by mouth every morning.    [provider]  mirabegron ER (MYRBETRIQ) 25 MG TB24 tablet Take 1 tablet (25 mg total) by mouth daily. 03/13/18   York Spaniel, MD  Multiple Vitamin (MULTIVITAMIN WITH MINERALS) TABS tablet Take 1 tablet by mouth every morning.    [provider]  naproxen sodium (ALEVE) 220 MG tablet Take  440 mg by mouth 2 (two) times daily as needed (FOR PAIN).    [provider]    Review of Systems:  Negative except as otherwise mentioned in HPI.  Physical Exam: Vitals:   04/04/18 1136  BP: (!) 120/49  Pulse: (!) 102  Temp: 98.1 F (36.7 C)  TempSrc: Oral  SpO2: 94%    Constitutional: Afebrile, feeling weak and tired; frail and chronically ill in appearance.   NAD. Eyes: PERRL, lids and conjunctivae normal, no icterus, no nystagmus. ENMT: Mucous membranes are dry on examination. Posterior pharynx clear of any exudate or lesions. Neck: normal, supple, no masses, no thyromegaly Respiratory: clear to auscultation bilaterally, no wheezing, no crackles. Normal respiratory effort. No accessory muscle use.  Cardiovascular: Regular rate and rhythm, no murmurs / rubs / gallops. No extremity edema. 2+ pedal pulses. No carotid bruits.  Abdomen: Soft, no guarding, no tenderness on palpation, no masses palpated. No hepatosplenomegaly. Bowel sounds positive.  Musculoskeletal: no clubbing / cyanosis. No joint deformity upper and lower extremities. Good ROM, no contractures. Normal muscle tone.  Skin: no rashes, lesions, ulcers. No induration.  Patient with decrease skin turgor Neurologic: CN 2-12 grossly intact.  Muscle strength 3 out of 5 on her left upper and lower extremities; 4 out of 5 on the right side.  Patient had residual weakness from recent stroke on her left side; but is also representing poor effort. Psychiatric: Normal judgment and insight. Alert and oriented x 3. Normal mood.    Labs on Admission: I have personally reviewed the following labs and imaging studies  CBC: Recent Labs  Lab 04/04/18 1303  WBC 13.4*  NEUTROABS 11.3*  HGB 10.3*  HCT 34.5*  MCV 88.0  PLT 306   Basic Metabolic Panel: Recent Labs  Lab 04/04/18 1303  NA 136  K 3.8  CL 100  CO2 26  GLUCOSE 107*  BUN 17  CREATININE 1.21*  CALCIUM 9.1   GFR: CrCl cannot be calculated (Unknown ideal weight.).  Urine analysis:    Component Value Date/Time   COLORURINE YELLOW 04/04/2018 1137   APPEARANCEUR HAZY (A) 04/04/2018 1137   APPEARANCEUR Clear 03/12/2018 1449   LABSPEC 1.015 04/04/2018 1137   PHURINE 6.0 04/04/2018 1137   GLUCOSEU NEGATIVE 04/04/2018 1137   HGBUR NEGATIVE 04/04/2018 1137   BILIRUBINUR NEGATIVE 04/04/2018 1137   BILIRUBINUR Negative 03/12/2018  1449   KETONESUR 5 (A) 04/04/2018 1137   PROTEINUR 30 (A) 04/04/2018 1137   NITRITE NEGATIVE 04/04/2018 1137   LEUKOCYTESUR MODERATE (A) 04/04/2018 1137   LEUKOCYTESUR Negative 03/12/2018 1449   Radiological Exams on Admission: Ct Renal Stone Study  Result Date: 04/04/2018 CLINICAL DATA:  Increased urination with incontinence and dysuria 3 days. EXAM: CT ABDOMEN AND PELVIS WITHOUT CONTRAST TECHNIQUE: Multidetector CT imaging of the abdomen and pelvis was performed following the standard protocol without IV contrast. COMPARISON:  None. FINDINGS: Lower chest: Lung bases are normal. Moderate to large hiatal hernia is present. Hepatobiliary: Minimal noncalcified gallstones versus sludge. Ill-defined 3.6 cm hypodense mass over the lateral segment left lobe of the liver. Possible tiny subcentimeter exophytic nodule over the posterior right lobe of the liver. No ductal dilatation. Pancreas: Normal. Spleen: Normal. Adrenals/Urinary Tract: Adrenal glands are normal. Kidneys are normal in size without hydronephrosis or nephrolithiasis. Ureters and bladder are normal. Stomach/Bowel: Moderate-to-large hiatal hernia as the stomach is otherwise unremarkable. Small bowel is normal. Appendix is normal. Diverticulosis of the colon most prominent over the sigmoid colon. There is inflammatory change within the  pericolonic fat adjacent the sigmoid colon in the midline lower abdomen/pelvis likely representing acute diverticulitis. No extraluminal air or diverticular abscess. Moderate fecal retention over the colon. Vascular/Lymphatic: Minimal calcified plaque over the abdominal aorta. No significant adenopathy. Reproductive: Normal. Other: Tiny amount of free fluid over the lower abdomen/pelvis. Musculoskeletal: Moderate degenerative changes of the spine with multilevel disc disease throughout the lumbar spine. Curvature of the lumbar spine convex left. IMPRESSION: Moderate diverticulosis of the colon with evidence of acute  diverticulitis of the sigmoid colon in the midline lower abdomen/pelvis. No evidence of perforation or diverticular abscess. 3.6 cm ill-defined hypodense mass over the left lobe of the liver with subcentimeter possible exophytic nodule over the posterior right lobe of the liver. The larger mass is indeterminate and concerning for primary versus metastatic neoplasm. Recommend further characterization with MRI. Moderate to large hiatal hernia. Aortic Atherosclerosis (ICD10-I70.0). Electronically Signed   By: Elberta Fortis M.D.   On: 04/04/2018 13:18    EKG: none   Assessment/Plan 1-anorexia and diarrhea: In the setting of acute diverticulitis -There is no signs of perforation or abscess -Patient reported some intermittent nausea -Will start her IV Cipro and Flagyl -As needed antiemetics and analgesics -Advance diet and assure that she is able to tolerated before discharge home on oral antibiotic regimen. -Will provide IV fluids and supportive care.  2-acute kidney injury: In the setting of prerenal azotemia from dehydration and poor oral intake. -Will provide IV fluids -No signs of infection in her UA. -Follow renal function trend.  3-Hx of completed stroke -Mild residual weakness on her left side -As physical therapy to evaluate to assure she is safe to discharge back home -Continue Plavix for secondary prevention.  4-Leukocytosis -In the setting of stress demargination and acute diverticulitis -Continue IV fluids -Continue antibiotics as mentioned above. -Repeat CBC to follow WBCs trend.  5-urine incontinence -No signs of infection in her UA -Continue Myrbetriq  6-Allergic rhinitis -Continue Claritin  DVT prophylaxis: Heparin Code Status: Full code Family Communication: No family at bedside. Disposition Plan: To be determined.  Hopefully discharge back home once hydrated and with renal function back to baseline. Consults called: None Admission status: Observation, length of  stay less than 2 midnights; MedSurg bed.   Time Spent: 70 minutes  Vassie Loll MD [facility] Pager [phone]  If 7PM-7AM, please contact night-coverage [email or web address] Password [email or web address]  04/04/2018, 3:25 PM

## 2018-04-04 NOTE — ED Provider Notes (Signed)
[facility] EMERGENCY DEPARTMENT Provider Note   CSN: [id number] Arrival date & time: 04/04/18  1125     History   Chief Complaint Chief Complaint  Patient presents with  . Urinary Frequency    HPI Sarah Page is a 77 y.o. female.  HPI  Pt was seen at 1235. Per pt and her family, c/o gradual onset and persistence of constant urinary frequency for the past 3 to 4 weeks. Pt states Neuro MD thought her symptoms may be related to recent stroke, rx myrbetriq. Pt's family states the medicine "worked for a while" then pt began to have return of symptoms for the past 1 week. Pt states she "just stands up and the urine runs out of me." Pt unclear if she has had dysuria. Denies head injury, no neck pain, no back pain, no flank pain, no abd pain, no N/V, no further episodes of diarrhea, no new focal motor weakness since previous stroke, no tingling/numbness in extremities, no fevers, no rash, no injury.     Past Medical History:  Diagnosis Date  . Anemia   . Arthritis   . Gait disturbance   . Rotator cuff tear    DR Magnus Ivan  . Stroke (HCC)   . Vision abnormalities     Patient Active Problem List   Diagnosis Date Noted  . Nondisplaced fracture of greater tuberosity of right humerus with routine healing 10/17/2017    Past Surgical History:  Procedure Laterality Date  . ORIF CLAVICULAR FRACTURE Left      OB History   No obstetric history on file.      Home Medications    Prior to Admission medications   Medication Sig Start Date End Date Taking? Authorizing Provider  amoxicillin (AMOXIL) 500 MG capsule Take 1,000 mg by mouth 2 (two) times daily. 7 day course starting on 03/06/2018 03/06/18   [provider]  Calcium Carbonate (CALCARB 600 PO) Take 1 tablet by mouth every morning.    [provider]  Cholecalciferol (VITAMIN D3 PO) Take 1 capsule by mouth every morning.    [provider]  clopidogrel (PLAVIX) 75 MG tablet  03/10/18   [provider]  ferrous sulfate 325 (65 FE) MG tablet Take 325 mg by mouth daily with breakfast.    [provider]  KRILL OIL PO Take 1 capsule by mouth every morning.    [provider]  loratadine (CLARITIN) 10 MG tablet Take 10 mg by mouth every morning.    [provider]  mirabegron ER (MYRBETRIQ) 25 MG TB24 tablet Take 1 tablet (25 mg total) by mouth daily. 03/13/18   York Spaniel, MD  Multiple Vitamin (MULTIVITAMIN WITH MINERALS) TABS tablet Take 1 tablet by mouth every morning.    [provider]  naproxen sodium (ALEVE) 220 MG tablet Take 440 mg by mouth 2 (two) times daily as needed (FOR PAIN).    [provider]    Family History Family History  Problem Relation Age of Onset  . Stroke Mother   . Transient ischemic attack Mother   . Stroke Father   . Heart attack Father   . Mental illness Brother   . Breast cancer Neg Hx     Social History Social History   Tobacco Use  . Smoking status: Never Smoker  . Smokeless tobacco: Never Used  Substance Use Topics  . Alcohol use: Yes    Comment: ONE GLASS DAILY  . Drug use: Never  Allergies   Patient has no known allergies.   Review of Systems Review of Systems ROS: Statement: All systems negative except as marked or noted in the HPI; Constitutional: Negative for fever and chills. ; ; Eyes: Negative for eye pain, redness and discharge. ; ; ENMT: Negative for ear pain, hoarseness, nasal congestion, sinus pressure and sore throat. ; ; Cardiovascular: Negative for chest pain, palpitations, diaphoresis, dyspnea and peripheral edema. ; ; Respiratory: Negative for cough, wheezing and stridor. ; ; Gastrointestinal: Negative for nausea, vomiting, diarrhea, abdominal pain, blood in stool, hematemesis, jaundice and rectal bleeding. . ; ; Genitourinary: +urinary frequency. Negative for dysuria, flank pain and hematuria. ; ; Musculoskeletal: Negative for back pain and neck pain. Negative  for swelling and trauma.; ; Skin: Negative for pruritus, rash, abrasions, blisters, bruising and skin lesion.; ; Neuro: Negative for headache, lightheadedness and neck stiffness. Negative for weakness, altered level of consciousness, altered mental status, extremity weakness, paresthesias, involuntary movement, seizure and syncope.       Physical Exam Updated Vital Signs BP (!) 120/49 (BP Location: Right Arm)   Pulse (!) 102   Temp 98.1 F (36.7 C) (Oral)   SpO2 94%    14:30 Orthostatic Vital Signs LA  Orthostatic Lying   BP- Lying: 125/54  Pulse- Lying: 89      Orthostatic Sitting  BP- Sitting: 134/72  Pulse- Sitting: 94      Orthostatic Standing at 0 minutes  BP- Standing at 0 minutes: 128/72  Pulse- Standing at 0 minutes: 99     Physical Exam 1240: Physical examination:  Nursing notes reviewed; Vital signs and O2 SAT reviewed;  Constitutional: Well developed, Well nourished, Well hydrated, In no acute distress; Head:  Normocephalic, atraumatic; Eyes: EOMI, PERRL, No scleral icterus; ENMT: Mouth and pharynx normal, Mucous membranes moist; Neck: Supple, Full range of motion, No lymphadenopathy; Cardiovascular: Regular rate and rhythm, No gallop; Respiratory: Breath sounds clear & equal bilaterally, No wheezes.  Speaking full sentences with ease, Normal respiratory effort/excursion; Chest: Nontender, Movement normal; Abdomen: Soft, Nontender, Nondistended, Normal bowel sounds. Rectal exam performed w/permission of pt and ED RN chaperone present.  Anal tone normal.  Non-tender, soft brown stool in rectal vault, heme neg.  No fissures, no external hemorrhoids, no palp masses.;;; Genitourinary: No CVA tenderness; Spine:  No midline CS, TS, LS tenderness.;; Extremities: Peripheral pulses normal, No tenderness, No edema, No calf edema or asymmetry.; Neuro: AA&Ox3, Major CN grossly intact. No facial droop. Speech clear.  No gross focal motor or sensory deficits in extremities. Strength 5/5  equal bilat UE's and LE's, including great toe dorsiflexion.  DTR 2/4 equal bilat UE's and LE's.  No gross sensory deficits.  Neg straight leg raises bilat..; Skin: Color normal, Warm, Dry.   ED Treatments / Results  Labs (all labs ordered are listed, but only abnormal results are displayed)   EKG None  Radiology   Procedures Procedures (including critical care time)  Medications Ordered in ED Medications - No data to display   Initial Impression / Assessment and Plan / ED Course  I have reviewed the triage vital signs and the nursing notes.  Pertinent labs & imaging results that were available during my care of the patient were reviewed by me and considered in my medical decision making (see chart for details).  MDM Reviewed: previous chart, nursing note and vitals Reviewed previous: labs Interpretation: labs and CT scan     Results for orders placed or performed during the hospital encounter of  04/04/18  Urinalysis, Routine w reflex microscopic  Result Value Ref Range   Color, Urine YELLOW YELLOW   APPearance HAZY (A) CLEAR   Specific Gravity, Urine 1.015 1.005 - 1.030   pH 6.0 5.0 - 8.0   Glucose, UA NEGATIVE NEGATIVE mg/dL   Hgb urine dipstick NEGATIVE NEGATIVE   Bilirubin Urine NEGATIVE NEGATIVE   Ketones, ur 5 (A) NEGATIVE mg/dL   Protein, ur 30 (A) NEGATIVE mg/dL   Nitrite NEGATIVE NEGATIVE   Leukocytes, UA MODERATE (A) NEGATIVE   RBC / HPF 0-5 0 - 5 RBC/hpf   WBC, UA 11-20 0 - 5 WBC/hpf   Bacteria, UA NONE SEEN NONE SEEN   Squamous Epithelial / LPF 0-5 0 - 5   Mucus PRESENT    Hyaline Casts, UA PRESENT    Non Squamous Epithelial 0-5 (A) NONE SEEN  Basic metabolic panel  Result Value Ref Range   Sodium 136 135 - 145 mmol/L   Potassium 3.8 3.5 - 5.1 mmol/L   Chloride 100 98 - 111 mmol/L   CO2 26 22 - 32 mmol/L   Glucose, Bld 107 (H) 70 - 99 mg/dL   BUN 17 8 - 23 mg/dL   Creatinine, Ser 8.67 (H) 0.44 - 1.00 mg/dL   Calcium 9.1 8.9 - 67.2 mg/dL    GFR calc non Af Amer 44 (L) >60 mL/min   GFR calc Af Amer 51 (L) >60 mL/min   Anion gap 10 5 - 15  CBC with Differential  Result Value Ref Range   WBC 13.4 (H) 4.0 - 10.5 K/uL   RBC 3.92 3.87 - 5.11 MIL/uL   Hemoglobin 10.3 (L) 12.0 - 15.0 g/dL   HCT 09.4 (L) 70.9 - 62.8 %   MCV 88.0 80.0 - 100.0 fL   MCH 26.3 26.0 - 34.0 pg   MCHC 29.9 (L) 30.0 - 36.0 g/dL   RDW 36.6 29.4 - 76.5 %   Platelets 306 150 - 400 K/uL   nRBC 0.0 0.0 - 0.2 %   Neutrophils Relative % 86 %   Neutro Abs 11.3 (H) 1.7 - 7.7 K/uL   Lymphocytes Relative 6 %   Lymphs Abs 0.8 0.7 - 4.0 K/uL   Monocytes Relative 8 %   Monocytes Absolute 1.1 (H) 0.1 - 1.0 K/uL   Eosinophils Relative 0 %   Eosinophils Absolute 0.0 0.0 - 0.5 K/uL   Basophils Relative 0 %   Basophils Absolute 0.1 0.0 - 0.1 K/uL   Immature Granulocytes 0 %   Abs Immature Granulocytes 0.06 0.00 - 0.07 K/uL    Ct Renal Stone Study Result Date: 04/04/2018 CLINICAL DATA:  Increased urination with incontinence and dysuria 3 days. EXAM: CT ABDOMEN AND PELVIS WITHOUT CONTRAST TECHNIQUE: Multidetector CT imaging of the abdomen and pelvis was performed following the standard protocol without IV contrast. COMPARISON:  None. FINDINGS: Lower chest: Lung bases are normal. Moderate to large hiatal hernia is present. Hepatobiliary: Minimal noncalcified gallstones versus sludge. Ill-defined 3.6 cm hypodense mass over the lateral segment left lobe of the liver. Possible tiny subcentimeter exophytic nodule over the posterior right lobe of the liver. No ductal dilatation. Pancreas: Normal. Spleen: Normal. Adrenals/Urinary Tract: Adrenal glands are normal. Kidneys are normal in size without hydronephrosis or nephrolithiasis. Ureters and bladder are normal. Stomach/Bowel: Moderate-to-large hiatal hernia as the stomach is otherwise unremarkable. Small bowel is normal. Appendix is normal. Diverticulosis of the colon most prominent over the sigmoid colon. There is inflammatory  change within the pericolonic fat adjacent  the sigmoid colon in the midline lower abdomen/pelvis likely representing acute diverticulitis. No extraluminal air or diverticular abscess. Moderate fecal retention over the colon. Vascular/Lymphatic: Minimal calcified plaque over the abdominal aorta. No significant adenopathy. Reproductive: Normal. Other: Tiny amount of free fluid over the lower abdomen/pelvis. Musculoskeletal: Moderate degenerative changes of the spine with multilevel disc disease throughout the lumbar spine. Curvature of the lumbar spine convex left. IMPRESSION: Moderate diverticulosis of the colon with evidence of acute diverticulitis of the sigmoid colon in the midline lower abdomen/pelvis. No evidence of perforation or diverticular abscess. 3.6 cm ill-defined hypodense mass over the left lobe of the liver with subcentimeter possible exophytic nodule over the posterior right lobe of the liver. The larger mass is indeterminate and concerning for primary versus metastatic neoplasm. Recommend further characterization with MRI. Moderate to large hiatal hernia. Aortic Atherosclerosis (ICD10-I70.0). Electronically Signed   By: Elberta Fortis M.D.   On: 04/04/2018 13:18    1455:  Bladder scan on arrival with urine.  Pt's daughter now reveals that pt had several days of diarrheal stools 1 week ago, has had poor PO intake since. Pt's daughter also states pt has become "so weak she slid to the floor today." Pt states she "is fine" and essentially negates all of this; but then finally admits this to be so. Pt does not have hx diverticulitis, WBC count today is elevated, as well as BUN/Cr and H/H is lower than previous. Pt too weak to pull her pants up on stretcher, needed assist with standing. Pt's family and I both concerned regarding pt's weakness and her living alone. Dx and testing d/w pt and family.  Questions answered.  Verb understanding, agreeable to admit. T/C returned from [facility] Dr. Gwenlyn Perking,  case discussed, including:  HPI, pertinent PM/SHx, VS/PE, dx testing, ED course and treatment:  Agreeable to admit.        Final Clinical Impressions(s) / ED Diagnoses   Final diagnoses:  None    ED Discharge Orders    None       Samuel Jester, DO 04/06/18 1935

## 2018-04-04 NOTE — ED Notes (Signed)
Bladder scan 139 ml urine

## 2018-04-04 NOTE — ED Triage Notes (Signed)
Pt reports increased urination, incontinence and burning with urination x 3 days.

## 2018-04-05 DIAGNOSIS — J309 Allergic rhinitis, unspecified: Secondary | ICD-10-CM | POA: Diagnosis not present

## 2018-04-05 DIAGNOSIS — N179 Acute kidney failure, unspecified: Secondary | ICD-10-CM | POA: Diagnosis not present

## 2018-04-05 DIAGNOSIS — K5792 Diverticulitis of intestine, part unspecified, without perforation or abscess without bleeding: Secondary | ICD-10-CM | POA: Diagnosis not present

## 2018-04-05 DIAGNOSIS — E860 Dehydration: Secondary | ICD-10-CM | POA: Diagnosis not present

## 2018-04-05 LAB — CBC
HCT: 31.1 % — ABNORMAL LOW (ref 36.0–46.0)
Hemoglobin: 9.5 g/dL — ABNORMAL LOW (ref 12.0–15.0)
MCH: 27 pg (ref 26.0–34.0)
MCHC: 30.5 g/dL (ref 30.0–36.0)
MCV: 88.4 fL (ref 80.0–100.0)
Platelets: 292 10*3/uL (ref 150–400)
RBC: 3.52 MIL/uL — ABNORMAL LOW (ref 3.87–5.11)
RDW: 14.2 % (ref 11.5–15.5)
WBC: 13.4 10*3/uL — ABNORMAL HIGH (ref 4.0–10.5)
nRBC: 0 % (ref 0.0–0.2)

## 2018-04-05 LAB — BASIC METABOLIC PANEL
Anion gap: 7 (ref 5–15)
BUN: 13 mg/dL (ref 8–23)
CO2: 23 mmol/L (ref 22–32)
Calcium: 8.2 mg/dL — ABNORMAL LOW (ref 8.9–10.3)
Chloride: 108 mmol/L (ref 98–111)
Creatinine, Ser: 0.86 mg/dL (ref 0.44–1.00)
GFR calc Af Amer: >60 mL/min (ref >60)
GFR calc non Af Amer: >60 mL/min (ref >60)
Glucose, Bld: 129 mg/dL — ABNORMAL HIGH (ref 70–99)
Potassium: 3.4 mmol/L — ABNORMAL LOW (ref 3.5–5.1)
Sodium: 138 mmol/L (ref 135–145)

## 2018-04-05 LAB — URINE CULTURE: Culture: NO GROWTH

## 2018-04-05 MED ORDER — CIPROFLOXACIN HCL 250 MG PO TABS
250.0000 mg | ORAL_TABLET | Freq: Two times a day (BID) | ORAL | Status: DC
  Administered 2018-04-05: 250 mg via ORAL
  Filled 2018-04-05: qty 1

## 2018-04-05 MED ORDER — METRONIDAZOLE 500 MG PO TABS
500.0000 mg | ORAL_TABLET | Freq: Three times a day (TID) | ORAL | Status: DC
  Administered 2018-04-05: 500 mg via ORAL
  Filled 2018-04-05: qty 1

## 2018-04-05 MED ORDER — METRONIDAZOLE 500 MG PO TABS: 500.0000 mg | ORAL_TABLET | Freq: Three times a day (TID) | ORAL | 0 refills | Status: AC

## 2018-04-05 MED ORDER — CIPROFLOXACIN HCL 250 MG PO TABS: 250.0000 mg | ORAL_TABLET | Freq: Two times a day (BID) | ORAL | 0 refills | Status: AC

## 2018-04-05 MED ORDER — ACETAMINOPHEN 500 MG PO TABS: 500.0000 mg | ORAL_TABLET | Freq: Four times a day (QID) | ORAL | 0 refills | Status: DC | PRN

## 2018-04-05 NOTE — Evaluation (Signed)
Physical Therapy Evaluation Patient Details Name: Sarah Page MRN: [id number] DOB: [birth year] Today's Date: 04/05/2018   History of Present Illness  Sarah Page is a 77 y.o. female with a past medical history significant for chronic anemia, recent stroke with mild left residual weakness as a deficit, degenerative arthritis, allergic rhinitis and osteopenia; who presented to the emergency department secondary to generalized weakness, decreased appetite, diarrhea and Incontinence.  Patient reported that approximately 3 weeks ago she experienced acute stroke that has left her with some mild residual weakness on her left side; subsequently she started experiencing some incontinence and was treated for UTI; symptoms failed to improved and was though to be associated with residual incontinence from stroke; patient started on Myrbetriq with some improvement. She continue feeling weak and noticed ongoing diarrhea and decrease appetite. On day of admission she was so weak, that slid out of bed and has trouble getting up. Patient was brought to ER for further evaluation and management.   Clinical Impression  Pt received in bed and was agreeable to PT evaluation. Pt admitted with above diagnosis. Pt from home alone, currently attending OPPT for balance/strength since her recent CVA 3WA, uses RW or rollator for ambulation at baseline, has intermittent help at home for minor ADLs, not driving, but does not require assistance for dressing, bathing, etc. Pt mod I bed mobility and supervision for transfers and gait with RW. She was able to ambulate 70ft this date with RW; slow and steady but required cues to clear her L foot to prevent toe drag (residual from the CVA). Pt states that her dtr is available to help intermittently once she is ready for d/c. Pt feels her mobility today is at her baseline level of function. PT feels that pt can return home with resumption of her OPPT services. Will continue to follow acutely and  update recommendations as needed.      Follow Up Recommendations Outpatient PT;Supervision - Intermittent    Equipment Recommendations  None recommended by PT    Recommendations for Other Services       Precautions / Restrictions Precautions Precautions: Fall Precaution Comments: has had 4-5 falls due to legs giving out; recent CVA ~3WA with some L sided hemiparesis      Mobility  Bed Mobility Overal bed mobility: Modified Independent             General bed mobility comments: increased time  Transfers Overall transfer level: Needs assistance   Transfers: Sit to/from Stand;Stand Pivot Transfers Sit to Stand: Supervision Stand pivot transfers: Supervision          Ambulation/Gait Ambulation/Gait assistance: Supervision Gait Distance (Feet): 40 Feet Assistive device: Rolling walker (2 wheeled) Gait Pattern/deviations: Step-through pattern;Decreased step length - left;Decreased dorsiflexion - left     General Gait Details: cues to increased L foot clearance/L hip flexion to reduce toe drag which is residual from recent stroke; slow, steady, mildly labored gait with RW  Stairs            Wheelchair Mobility    Modified Rankin (Stroke Patients Only)       Balance Overall balance assessment: Needs assistance Sitting-balance support: Feet supported Sitting balance-Leahy Scale: Good     Standing balance support: Bilateral upper extremity supported;No upper extremity supported Standing balance-Leahy Scale: Good Standing balance comment: fair without UE support on RW and good with RW  Pertinent Vitals/Pain Pain Assessment: No/denies pain    Home Living Family/patient expects to be discharged to:: Private residence Living Arrangements: Alone   Type of Home: House Home Access: Stairs to enter Entrance Stairs-Rails: Can reach both Entrance Stairs-Number of Steps: 3 Home Layout: One level Home Equipment:  Shower seat - built in;Toilet riser;Grab bars - tub/shower;Walker - 2 wheels;Walker - 4 wheels;Cane - single point      Prior Function Level of Independence: Independent with assistive device(s)         Comments: pt reports increased time to complete bathing tasks and has been having increased difficulty wiht it, however, denies any need for assistance to complete. Uses a RW in the community and rollator in the house. Has a friend that will come a couple days a week to assist with straightening up her house.     Hand Dominance   Dominant Hand: Right    Extremity/Trunk Assessment        Lower Extremity Assessment Lower Extremity Assessment: LLE deficits/detail;Overall WFL for tasks assessed LLE Deficits / Details: residual weakness from R CVA ~3WA; grossly 4-/5 LLE       Communication   Communication: No difficulties  Cognition Arousal/Alertness: Awake/alert Behavior During Therapy: WFL for tasks assessed/performed Overall Cognitive Status: Within Functional Limits for tasks assessed                                        General Comments      Exercises     Assessment/Plan    PT Assessment Patient needs continued PT services  PT Problem List Decreased strength;Decreased activity tolerance;Decreased balance;Decreased mobility       PT Treatment Interventions Gait training;Stair training;Functional mobility training;Therapeutic activities;Therapeutic exercise;Balance training;Neuromuscular re-education;Patient/family education;Manual techniques    PT Goals (Current goals can be found in the Care Plan section)  Acute Rehab PT Goals Patient Stated Goal: to go home PT Goal Formulation: With patient/family Time For Goal Achievement: 04/12/18 Potential to Achieve Goals: Good    Frequency Min 3X/week   Barriers to discharge        Co-evaluation               AM-PAC PT "6 Clicks" Mobility  Outcome Measure Help needed turning from your  back to your side while in a flat bed without using bedrails?: None Help needed moving from lying on your back to sitting on the side of a flat bed without using bedrails?: None Help needed moving to and from a bed to a chair (including a wheelchair)?: A Little Help needed standing up from a chair using your arms (e.g., wheelchair or bedside chair)?: A Little Help needed to walk in hospital room?: A Little Help needed climbing 3-5 steps with a railing? : A Lot 6 Click Score: 19    End of Session Equipment Utilized During Treatment: Gait belt Activity Tolerance: Patient tolerated treatment well;No increased pain Patient left: in chair;with call bell/phone within reach Nurse Communication: Mobility status PT Visit Diagnosis: Muscle weakness (generalized) (M62.81);Other abnormalities of gait and mobility (R26.89);Unsteadiness on feet (R26.81);History of falling (Z91.81);Hemiplegia and hemiparesis Hemiplegia - Right/Left: Left Hemiplegia - dominant/non-dominant: Non-dominant    Time: 0300-9233 PT Time Calculation (min) (ACUTE ONLY): 26 min   Charges:   PT Evaluation $PT Eval Low Complexity: 1 Low PT Treatments $Therapeutic Activity: 8-22 mins           Jac Canavan  PT, DPT

## 2018-04-05 NOTE — Discharge Summary (Signed)
Physician Discharge Summary  Sarah Page AUQ:[id number] DOB: [birth year] DOA: 04/04/2018  PCP: Selinda Flavin, MD  Admit date: 04/04/2018 Discharge date: 04/05/2018  Admitted From: Home  Disposition:  Home   Recommendations for Outpatient Follow-up and new medication changes:  1. Follow up with with Dr. Dimas Aguas in 7 days.  2. Continue ciprofloxacin and metronidazole combination for 14 days.  3. Please follow up Liver MRI to follow on masses.   Home Health: no  Equipment/Devices: walker    Discharge Condition: stable  CODE STATUS: full  Diet recommendation: no family at the bedside   Brief/Interim Summary: 77 year old female who presented with generalized weakness, decreased appetite, diarrhea and incontinence.  Patient does have significant past medical history for chronic anemia, recent CVA with mild left weakness, degenerative arthritis, allergic rhinitis and osteopenia. Recently as outpatient she was treated for urinary tract infection due to urinary incontinence, her symptoms progressed into diarrhea and decreased appetite, to the point where she had difficulty ambulating and getting out of her bed.  On her initial physical examination blood pressure 120/49, heart rate 102, temperature 98.1, oxygen saturation 94%.  Dry mucous membranes, lungs clear to auscultation bilaterally, heart S1-2 present rhythm, abdomen was soft, nontender, nondistended, no lower extremity edema.  CT of the abdomen showed moderate diverticulosis of the colon with evidence of acute diverticulitis of the sigmoid colon in the midline lower abdomen/pelvis. No evidence of perforation or abscess.  Incidental finding of 3.6 cm ill-defined hypodense mass over the left lobe of the liver with subsequent possible exophytic nodule over the posterior right lobe of the liver.  Her chest x-ray was negative for infiltrates.  She was admitted to the hospital working diagnosis of acute diverticulitis complicated with AKI.   1.   Acute diverticulitis.  Patient was admitted to the medical ward, she received intravenous isotonic fluids, IV antibiotics, as needed IV analgesics and antiemetics.  She responded well to medical therapy, at discharge tolerating p.o. diet adequately, her energy has increased.  She feels comfortable being discharged home.  Will continue antibiotic therapy with ciprofloxacin metronidazole for 14 days, follow-up as an outpatient.  2.  Prerenal acute kidney injury.  Patient received isotonic saline intravenously with improvement of her kidney function, discharge creatinine 0.86, potassium 3.4, serum bicarbonate 23 and sodium 138.  Patient tolerated p.o. diet adequately.  3.  History of CVA with left-sided weakness.  Patient was seen by physical therapy, recommendations to continue outpatient therapy.  Continue clopidogrel, patient not on statin, follow-up as an outpatient.  4.  Iron deficiency anemia.  Continue ferrous sulfate.  Hemoglobin and hematocrit stable, no indication for PRBC transfusion.  Discharge Diagnoses:  Principal Problem:   Acute diverticulitis Active Problems:   Hx of completed stroke   AKI (acute kidney injury) (HCC)   Leukocytosis   Urine incontinence   Allergic rhinitis   Dehydration    Discharge Instructions   Allergies as of 04/05/2018   No Known Allergies     Medication List    TAKE these medications   acetaminophen 500 MG tablet Commonly known as:  TYLENOL Take 1 tablet (500 mg total) by mouth every 6 (six) hours as needed for mild pain (or Fever >/= 101).   ALEVE 220 MG tablet Generic drug:  naproxen sodium Take 440 mg by mouth 2 (two) times daily as needed (FOR PAIN).   CALCARB 600 PO Take 1 tablet by mouth every morning.   ciprofloxacin 250 MG tablet Commonly known as:  CIPRO Take 1 tablet (  250 mg total) by mouth 2 (two) times daily for 14 days.   clopidogrel 75 MG tablet Commonly known as:  PLAVIX   ferrous sulfate 325 (65 FE) MG tablet Take  325 mg by mouth daily with breakfast.   KRILL OIL PO Take 1 capsule by mouth every morning.   loratadine 10 MG tablet Commonly known as:  CLARITIN Take 10 mg by mouth every morning.   metroNIDAZOLE 500 MG tablet Commonly known as:  FLAGYL Take 1 tablet (500 mg total) by mouth every 8 (eight) hours for 14 days.   mirabegron ER 25 MG Tb24 tablet Commonly known as:  MYRBETRIQ Take 1 tablet (25 mg total) by mouth daily.   multivitamin with minerals Tabs tablet Take 1 tablet by mouth every morning.   VITAMIN D3 PO Take 1 capsule by mouth every morning.       No Known Allergies  Consultations:     Procedures/Studies: Dg Chest 2 View  Result Date: 03/07/2018 CLINICAL DATA:  Weakness, fall EXAM: CHEST - 2 VIEW COMPARISON:  None. FINDINGS: Lungs are essentially clear. Mild left basilar opacity, likely atelectasis. No pleural effusion or pneumothorax. The heart is normal in size. Moderate hiatal hernia. Visualized osseous structures are within normal limits. IMPRESSION: No evidence of acute cardiopulmonary disease. Electronically Signed   By: Charline Bills M.D.   On: 03/07/2018 19:38   Ct Renal Stone Study  Result Date: 04/04/2018 CLINICAL DATA:  Increased urination with incontinence and dysuria 3 days. EXAM: CT ABDOMEN AND PELVIS WITHOUT CONTRAST TECHNIQUE: Multidetector CT imaging of the abdomen and pelvis was performed following the standard protocol without IV contrast. COMPARISON:  None. FINDINGS: Lower chest: Lung bases are normal. Moderate to large hiatal hernia is present. Hepatobiliary: Minimal noncalcified gallstones versus sludge. Ill-defined 3.6 cm hypodense mass over the lateral segment left lobe of the liver. Possible tiny subcentimeter exophytic nodule over the posterior right lobe of the liver. No ductal dilatation. Pancreas: Normal. Spleen: Normal. Adrenals/Urinary Tract: Adrenal glands are normal. Kidneys are normal in size without hydronephrosis or  nephrolithiasis. Ureters and bladder are normal. Stomach/Bowel: Moderate-to-large hiatal hernia as the stomach is otherwise unremarkable. Small bowel is normal. Appendix is normal. Diverticulosis of the colon most prominent over the sigmoid colon. There is inflammatory change within the pericolonic fat adjacent the sigmoid colon in the midline lower abdomen/pelvis likely representing acute diverticulitis. No extraluminal air or diverticular abscess. Moderate fecal retention over the colon. Vascular/Lymphatic: Minimal calcified plaque over the abdominal aorta. No significant adenopathy. Reproductive: Normal. Other: Tiny amount of free fluid over the lower abdomen/pelvis. Musculoskeletal: Moderate degenerative changes of the spine with multilevel disc disease throughout the lumbar spine. Curvature of the lumbar spine convex left. IMPRESSION: Moderate diverticulosis of the colon with evidence of acute diverticulitis of the sigmoid colon in the midline lower abdomen/pelvis. No evidence of perforation or diverticular abscess. 3.6 cm ill-defined hypodense mass over the left lobe of the liver with subcentimeter possible exophytic nodule over the posterior right lobe of the liver. The larger mass is indeterminate and concerning for primary versus metastatic neoplasm. Recommend further characterization with MRI. Moderate to large hiatal hernia. Aortic Atherosclerosis (ICD10-I70.0). Electronically Signed   By: Elberta Fortis M.D.   On: 04/04/2018 13:18   Vas US Carotid  Result Date: 03/28/2018 Carotid Arterial Duplex Study Indications:   CVA. Other Factors: In 11/19, patient experience fatigue, unsteady gait and weakness                in her  left arm. MRI showed a recent right centrum semiovale                stroke. Performing Technologist: Jake Seats RDMS, RVT, RDCS  Examination Guidelines: A complete evaluation includes B-mode imaging, spectral Doppler, color Doppler, and power Doppler as needed of all accessible  portions of each vessel. Bilateral testing is considered an integral part of a complete examination. Limited examinations for reoccurring indications may be performed as noted.  Right Carotid Findings: +----------+--------+--------+--------+--------+--------+           PSV cm/sEDV cm/sStenosisDescribeComments +----------+--------+--------+--------+--------+--------+ CCA Prox  121     18                               +----------+--------+--------+--------+--------+--------+ CCA Distal83      18                               +----------+--------+--------+--------+--------+--------+ ICA Prox  59      16                               +----------+--------+--------+--------+--------+--------+ ICA Mid   69      21                               +----------+--------+--------+--------+--------+--------+ ICA Distal87      23                               +----------+--------+--------+--------+--------+--------+ ECA       86      3                                +----------+--------+--------+--------+--------+--------+ +----------+--------+-------+----------------+-------------------+           PSV cm/sEDV cmsDescribe        Arm Pressure (mmHG) +----------+--------+-------+----------------+-------------------+ Subclavian192     0      Multiphasic, [id number]                 +----------+--------+-------+----------------+-------------------+ +---------+--------+--+--------+--+---------+ VertebralPSV cm/s42EDV cm/s10Antegrade +---------+--------+--+--------+--+---------+  Left Carotid Findings: +----------+--------+--------+--------+--------+--------+           PSV cm/sEDV cm/sStenosisDescribeComments +----------+--------+--------+--------+--------+--------+ CCA Prox  103     13                               +----------+--------+--------+--------+--------+--------+ CCA Distal83      17                                +----------+--------+--------+--------+--------+--------+ ICA Prox  50      14                               +----------+--------+--------+--------+--------+--------+ ICA Mid   54      35                               +----------+--------+--------+--------+--------+--------+ ICA Distal79      25                               +----------+--------+--------+--------+--------+--------+  ECA       59      6                                +----------+--------+--------+--------+--------+--------+ +----------+--------+--------+----------------+-------------------+ SubclavianPSV cm/sEDV cm/sDescribe        Arm Pressure (mmHG) +----------+--------+--------+----------------+-------------------+           107     0       Multiphasic, [id number]                 +----------+--------+--------+----------------+-------------------+ +---------+--------+--+--------+-+---------+ VertebralPSV cm/s52EDV cm/s9Antegrade +---------+--------+--+--------+-+---------+  Summary: Right Carotid: There is no evidence of stenosis in the right ICA. Left Carotid: There is no evidence of stenosis in the left ICA. Vertebrals:  Bilateral vertebral arteries demonstrate antegrade flow. Subclavians: Normal flow hemodynamics were seen in bilateral subclavian              arteries. *See table(s) above for measurements and observations.  Electronically signed by Nanetta Batty MD on 03/28/2018 at 10:16:27 AM.    Final        Subjective: Patient is feeling better, no nausea or vomiting, no abdominal pain. Tolerating po well. No dyspnea, or chest pain.   Discharge Exam: Vitals:   04/04/18 2046 04/05/18 0600  BP: (!) 153/70 126/67  Pulse: (!) 105 93  Resp: 18 16  Temp: 98.6 F (37 C) 98.3 F (36.8 C)  SpO2: 93% 92%   Vitals:   04/04/18 1637 04/04/18 2046 04/04/18 2250 04/05/18 0600  BP: 137/73 (!) 153/70  126/67  Pulse: (!) 101 (!) 105  93  Resp: 20 18  16   Temp: 98.4 F (36.9 C) 98.6 F (37 C)   98.3 F (36.8 C)  TempSrc: Oral Oral  Oral  SpO2: 100% 93%  92%  Weight:   66.3 kg   Height:   5\' 5"  (1.651 m)     General: Not in pain or dyspnea Neurology: Awake and alert, non focal  E ENT: mild pallor, no icterus, oral mucosa moist Cardiovascular: No JVD. S1-S2 present, rhythmic, no gallops, rubs, or murmurs. No lower extremity edema. Pulmonary: positive breath sounds bilaterally, adequate air movement, no wheezing, rhonchi or rales. Gastrointestinal. Abdomen with no organomegaly, non tender, no rebound or guarding Skin. No rashes Musculoskeletal: no joint deformities   The results of significant diagnostics from this hospitalization (including imaging, microbiology, ancillary and laboratory) are listed below for reference.     Microbiology: No results found for this or any previous visit (from the past 240 hour(s)).   Labs: BNP (last 3 results) No results for input(s): BNP in the last 8760 hours. Basic Metabolic Panel: Recent Labs  Lab 04/04/18 1303 04/05/18 0616  NA 136 138  K 3.8 3.4*  CL 100 108  CO2 26 23  GLUCOSE 107* 129*  BUN 17 13  CREATININE 1.21* 0.86  CALCIUM 9.1 8.2*  MG 2.2  --   PHOS 2.8  --    Liver Function Tests: No results for input(s): AST, ALT, ALKPHOS, BILITOT, PROT, ALBUMIN in the last 168 hours. No results for input(s): LIPASE, AMYLASE in the last 168 hours. No results for input(s): AMMONIA in the last 168 hours. CBC: Recent Labs  Lab 04/04/18 1303 04/05/18 0616  WBC 13.4* 13.4*  NEUTROABS 11.3*  --   HGB 10.3* 9.5*  HCT 34.5* 31.1*  MCV 88.0 88.4  PLT 306 292   Cardiac Enzymes: No results for input(s): CKTOTAL, CKMB,  CKMBINDEX, TROPONINI in the last 168 hours. BNP: Invalid input(s): POCBNP CBG: No results for input(s): GLUCAP in the last 168 hours. D-Dimer No results for input(s): DDIMER in the last 72 hours. Hgb A1c No results for input(s): HGBA1C in the last 72 hours. Lipid Profile No results for input(s): CHOL, HDL,  LDLCALC, TRIG, CHOLHDL, LDLDIRECT in the last 72 hours. Thyroid function studies Recent Labs    04/04/18 1303  TSH 2.208   Anemia work up No results for input(s): VITAMINB12, FOLATE, FERRITIN, TIBC, IRON, RETICCTPCT in the last 72 hours. Urinalysis    Component Value Date/Time   COLORURINE YELLOW 04/04/2018 1137   APPEARANCEUR HAZY (A) 04/04/2018 1137   APPEARANCEUR Clear 03/12/2018 1449   LABSPEC 1.015 04/04/2018 1137   PHURINE 6.0 04/04/2018 1137   GLUCOSEU NEGATIVE 04/04/2018 1137   HGBUR NEGATIVE 04/04/2018 1137   BILIRUBINUR NEGATIVE 04/04/2018 1137   BILIRUBINUR Negative 03/12/2018 1449   KETONESUR 5 (A) 04/04/2018 1137   PROTEINUR 30 (A) 04/04/2018 1137   NITRITE NEGATIVE 04/04/2018 1137   LEUKOCYTESUR MODERATE (A) 04/04/2018 1137   LEUKOCYTESUR Negative 03/12/2018 1449   Sepsis Labs Invalid input(s): PROCALCITONIN,  WBC,  LACTICIDVEN Microbiology No results found for this or any previous visit (from the past 240 hour(s)).   Time coordinating discharge: 45 minutes  SIGNED:   Coralie Keens, MD  [facility] 04/05/2018, 10:28 AM Pager [phone]  If 7PM-7AM, please contact night-coverage [email or web address] Password [email or web address]

## 2018-04-05 NOTE — Plan of Care (Signed)
  Problem: Acute Rehab PT Goals(only PT should resolve) Goal: Patient Will Transfer Sit To/From Stand Flowsheets (Taken 04/05/2018 0914) Patient will transfer sit to/from stand: with modified independence Goal: Pt Will Transfer Bed To Chair/Chair To Bed Flowsheets (Taken 04/05/2018 0914) Pt will Transfer Bed to Chair/Chair to Bed: with modified independence Goal: Pt Will Ambulate Flowsheets (Taken 04/05/2018 0914) Pt will Ambulate: 75 feet; with modified independence; with rolling walker    Jac Canavan PT, DPT

## 2018-04-12 ENCOUNTER — Telehealth: Payer: Self-pay | Admitting: Neurology

## 2018-04-12 NOTE — Telephone Encounter (Signed)
I failed to reach patient, she called 3am complaining of urinary incontinence, I have advised her to increase Myrbetriq to 25mg  2 tabs qhs.  Please call check on patient her symptoms

## 2018-04-14 NOTE — Telephone Encounter (Signed)
I contacted the patient. She stated her urinary incontinence is worst at bed time. She denied any UTI like symptoms. I advised per Dr. Terrace Arabia we would change the Myrbetriq to 25 mg 2 tabs at bed time. She verbalized understanding.   Patient is currently residing at [facility] living in [place], [place].  I contacted the facility and spoke with Sue Lush and advised on medication change. She voiced understanding and requested a fax with the order for Myrbetriq.  Fax submitted to # [phone] Confirmation received. Patient advised if this change does not help to please call back and advise. MB RN

## 2018-04-28 ENCOUNTER — Ambulatory Visit (INDEPENDENT_AMBULATORY_CARE_PROVIDER_SITE_OTHER): Payer: Medicare Other | Admitting: Orthopaedic Surgery

## 2018-04-30 ENCOUNTER — Telehealth: Payer: Self-pay | Admitting: Neurology

## 2018-04-30 MED ORDER — MIRABEGRON ER 25 MG PO TB24: 25.0000 mg | ORAL_TABLET | Freq: Every day | ORAL | 3 refills | Status: DC

## 2018-04-30 NOTE — Telephone Encounter (Signed)
Pt states she is having the urination problem again and would like to get a refill on the mirabegron ER (MYRBETRIQ) 25 MG TB24 tablet  [facility]

## 2018-04-30 NOTE — Addendum Note (Signed)
Addended by: Ann Maki T on: 04/30/2018 05:05 PM   Modules accepted: Orders

## 2018-04-30 NOTE — Telephone Encounter (Signed)
Refill submitted per pt's requested.

## 2018-05-05 ENCOUNTER — Other Ambulatory Visit: Payer: Self-pay | Admitting: Neurology

## 2018-05-05 MED ORDER — MIRABEGRON ER 50 MG PO TB24: 50.0000 mg | ORAL_TABLET | Freq: Every day | ORAL | 3 refills | Status: DC

## 2019-01-15 ENCOUNTER — Other Ambulatory Visit: Payer: Self-pay | Admitting: Family Medicine

## 2019-01-15 DIAGNOSIS — Z1231 Encounter for screening mammogram for malignant neoplasm of breast: Secondary | ICD-10-CM

## 2019-01-29 ENCOUNTER — Encounter (INDEPENDENT_AMBULATORY_CARE_PROVIDER_SITE_OTHER): Payer: Self-pay | Admitting: Nurse Practitioner

## 2019-02-10 ENCOUNTER — Encounter: Payer: Self-pay | Admitting: Orthopaedic Surgery

## 2019-02-10 ENCOUNTER — Other Ambulatory Visit: Payer: Self-pay

## 2019-02-10 ENCOUNTER — Ambulatory Visit (INDEPENDENT_AMBULATORY_CARE_PROVIDER_SITE_OTHER): Payer: Medicare Other | Admitting: Orthopaedic Surgery

## 2019-02-10 DIAGNOSIS — M25511 Pain in right shoulder: Secondary | ICD-10-CM

## 2019-02-10 DIAGNOSIS — G8929 Other chronic pain: Secondary | ICD-10-CM | POA: Diagnosis not present

## 2019-02-10 MED ORDER — METHYLPREDNISOLONE ACETATE 40 MG/ML IJ SUSP
40.0000 mg | INTRAMUSCULAR | Status: AC | PRN
  Administered 2019-02-10: 40 mg via INTRA_ARTICULAR

## 2019-02-10 MED ORDER — LIDOCAINE HCL 1 % IJ SOLN
3.0000 mL | INTRAMUSCULAR | Status: AC | PRN
  Administered 2019-02-10: 3 mL

## 2019-02-10 NOTE — Progress Notes (Signed)
Office Visit Note   Patient: Sarah Page           Date of Birth: 06-12-1940           MRN: [id number] Visit Date: 02/10/2019              Requested by: Selinda Flavin, MD [address] [place],  [place] [address] PCP: Selinda Flavin, MD   Assessment & Plan: Visit Diagnoses:  1. Chronic right shoulder pain     Plan: I do think it was worth trying a steroid injection subacromial space today.  It was a very tight space but she tolerated injection well.  All question concerns were answered and addressed.  She is someone I would not hesitate to repeat injection and in less than 3 months if needed and she will call us if she needs 1.  Follow-Up Instructions: No follow-ups on file.   Orders:  Orders Placed This Encounter  Procedures  . Large Joint Inj   No orders of the defined types were placed in this encounter.     Procedures: Large Joint Inj: R subacromial bursa on 02/10/2019 10:27 AM Indications: pain and diagnostic evaluation Details: 22 G 1.5 in needle  Arthrogram: No  Medications: 3 mL lidocaine 1 %; 40 mg methylPREDNISolone acetate 40 MG/ML Outcome: tolerated well, no immediate complications Procedure, treatment alternatives, risks and benefits explained, specific risks discussed. Consent was given by the patient. Immediately prior to procedure a time out was called to verify the correct patient, procedure, equipment, support staff and site/side marked as required. Patient was prepped and draped in the usual sterile fashion.       Clinical Data: No additional findings.   Subjective: Chief Complaint  Patient presents with  . Right Shoulder - Pain  The patient comes in today requesting a shoulder injection right shoulder.  She does have remote history of a greater tuberosity fracture of the proximal humerus that she did healed.  He said her mobility significantly better but she still has some weakness in that shoulder.  She is 78 years old.  She is very active at the [facility]  and outdoors doing yard work.  She is now diabetic.  She reports some shoulder weakness but overall she feels like her mobility is good but she is having pain with overhead motion.  HPI  Review of Systems She currently denies any headache, chest pain, shortness of breath, fever, chills, nausea, vomiting  Objective: Vital Signs: There were no vitals taken for this visit.  Physical Exam She is alert and orient x3 and in no acute distress Ortho Exam Examination of her shoulder does show some weakness the rotator cuff.  She has limited abduction in terms of not getting to full abduction position but her mobility is significantly better overall and she can perform her activities of daily living. Specialty Comments:  No specialty comments available.  Imaging: No results found.   PMFS History: Patient Active Problem List   Diagnosis Date Noted  . Acute diverticulitis 04/04/2018  . Hx of completed stroke 04/04/2018  . AKI (acute kidney injury) (HCC) 04/04/2018  . Leukocytosis 04/04/2018  . Urine incontinence 04/04/2018  . Allergic rhinitis 04/04/2018  . Dehydration 04/04/2018  . Nondisplaced fracture of greater tuberosity of right humerus with routine healing 10/17/2017   Past Medical History:  Diagnosis Date  . Anemia   . Arthritis   . Gait disturbance   . Rotator cuff tear    DR Magnus Ivan  . Stroke (  [facility])   . Vision abnormalities     Family History  Problem Relation Age of Onset  . Stroke Mother   . Transient ischemic attack Mother   . Stroke Father   . Heart attack Father   . Mental illness Brother   . Breast cancer Neg Hx     Past Surgical History:  Procedure Laterality Date  . ORIF CLAVICULAR FRACTURE Left    Social History   Occupational History  . Not on file  Tobacco Use  . Smoking status: Never Smoker  . Smokeless tobacco: Never Used  Substance and Sexual Activity  . Alcohol use: Yes    Comment: ONE GLASS DAILY  . Drug use: Never  . Sexual activity:  Not on file

## 2019-03-03 ENCOUNTER — Other Ambulatory Visit: Payer: Self-pay

## 2019-03-03 ENCOUNTER — Ambulatory Visit
Admission: RE | Admit: 2019-03-03 | Discharge: 2019-03-03 | Disposition: A | Discharge status: Home / Self Care | Payer: Medicare Other | Source: Ambulatory Visit | Attending: Family Medicine | Admitting: Family Medicine

## 2019-03-03 DIAGNOSIS — Z1231 Encounter for screening mammogram for malignant neoplasm of breast: Secondary | ICD-10-CM

## 2019-04-02 ENCOUNTER — Ambulatory Visit (INDEPENDENT_AMBULATORY_CARE_PROVIDER_SITE_OTHER): Payer: Medicare Other | Admitting: Nurse Practitioner

## 2019-04-02 ENCOUNTER — Other Ambulatory Visit: Payer: Self-pay

## 2019-04-02 ENCOUNTER — Encounter (INDEPENDENT_AMBULATORY_CARE_PROVIDER_SITE_OTHER): Payer: Self-pay | Admitting: Nurse Practitioner

## 2019-04-02 VITALS — BP 178/97 | HR 78 | Temp 97.3°F | Ht 65.0 in | Wt 149.5 lb

## 2019-04-02 DIAGNOSIS — R197 Diarrhea, unspecified: Secondary | ICD-10-CM | POA: Diagnosis not present

## 2019-04-02 DIAGNOSIS — D649 Anemia, unspecified: Secondary | ICD-10-CM

## 2019-04-02 LAB — COMPLETE METABOLIC PANEL WITH GFR
AG Ratio: 1.4 (calc) (ref 1.0–2.5)
ALT: 13 U/L (ref 6–29)
AST: 22 U/L (ref 10–35)
Albumin: 4.1 g/dL (ref 3.6–5.1)
Alkaline phosphatase (APISO): 70 U/L (ref 37–153)
BUN/Creatinine Ratio: 19 (calc) (ref 6–22)
BUN: 24 mg/dL (ref 7–25)
CO2: 29 mmol/L (ref 20–32)
Calcium: 9.7 mg/dL (ref 8.6–10.4)
Chloride: 103 mmol/L (ref 98–110)
Creat: 1.26 mg/dL — ABNORMAL HIGH (ref 0.60–0.93)
GFR, Est African American: 48 mL/min/{1.73_m2} — ABNORMAL LOW (ref >=60)
GFR, Est Non African American: 41 mL/min/{1.73_m2} — ABNORMAL LOW (ref >=60)
Globulin: 2.9 g/dL (calc) (ref 1.9–3.7)
Glucose, Bld: 91 mg/dL (ref 65–139)
Potassium: 5 mmol/L (ref 3.5–5.3)
Sodium: 140 mmol/L (ref 135–146)
Total Bilirubin: 0.5 mg/dL (ref 0.2–1.2)
Total Protein: 7 g/dL (ref 6.1–8.1)

## 2019-04-02 LAB — CBC WITH DIFFERENTIAL/PLATELET
Absolute Monocytes: 561 cells/uL (ref 200–950)
Basophils Absolute: 88 cells/uL (ref 0–200)
Basophils Relative: 1.6 %
Eosinophils Absolute: 462 cells/uL (ref 15–500)
Eosinophils Relative: 8.4 %
HCT: 35.6 % (ref 35.0–45.0)
Hemoglobin: 11.3 g/dL — ABNORMAL LOW (ref 11.7–15.5)
Lymphs Abs: 1040 cells/uL (ref 850–3900)
MCH: 26.2 pg — ABNORMAL LOW (ref 27.0–33.0)
MCHC: 31.7 g/dL — ABNORMAL LOW (ref 32.0–36.0)
MCV: 82.4 fL (ref 80.0–100.0)
MPV: 11.7 fL (ref 7.5–12.5)
Monocytes Relative: 10.2 %
Neutro Abs: 3350 cells/uL (ref 1500–7800)
Neutrophils Relative %: 60.9 %
Platelets: 321 10*3/uL (ref 140–400)
RBC: 4.32 10*6/uL (ref 3.80–5.10)
RDW: 15.8 % — ABNORMAL HIGH (ref 11.0–15.0)
Total Lymphocyte: 18.9 %
WBC: 5.5 10*3/uL (ref 3.8–10.8)

## 2019-04-02 NOTE — Progress Notes (Addendum)
Subjective:    Patient ID: Sarah Page, female    DOB: 04-Oct-1940, 78 y.o.   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  She presents today for further evaluation regarding nonbloody diarrhea with mild lower abdominal discomfort for the past few months.  However, she started taking Florajen probiotic approximately 2 weeks ago and her diarrhea and lower abdominal pain have completely resolved.  She is lactose intolerant.  Dairy products such as ice cream will trigger loose stools.  She underwent a colonoscopy by Dr. Marcha Soldersathey in 2019, she reported the results were normal.  I will request a copy of her colonoscopy report for further review.  She denies having any dysphagia, heartburn or upper abdominal pain.  No melena.  She reports seeing a rheumatologist recently due to having bilateral leg weakness and arthritis specifically to her hands.  She stated she was diagnosed with rheumatoid arthritis and she was prescribed a Prednisone taper primarily for her leg weakness which he intends to start today.  In review of her medication list, she takes Naproxen 220mg  2 tabs once daily for right shoulder pain.  I informed the patient not to take the Naproxen while she is taking the Prednisone.  Taking Naproxen simultaneously with Prednisone in the setting of being on Plavix increases her risk for upper GI bleeding.  She has frequent urination which she stated she has addressed this issue with her PCP.  She is widowed.  She is a non-smoker.  She drinks 1 glass of wine at dinnertime.  No family history of gastric or colorectal cancer.  Her mother died at the age is 3381 secondary to a stroke.  Her father died at the age of 76 from an MI, he was a smoker.  She denies any other complaints today.  She reports history of chronic anemia which she stated has resolved.  In review of her  records, she was [facility] 04[facility]04/2018 with increased urination, abdominal pain and diarrhea.  WBC 13.4.  Hemoglobin 10.3.  Hematocrit 34.5.  MCV 88.  Platelet 306.  An abdominal/pelvic CT without contrast identified acute diverticulitis to the sigmoid colon.  There was no evidence of a perforation or diverticular abscess.  Acute diverticulitis.  She was prescribed Cipro and metronidazole for 14 days.  Please note, after the patient was discharged from the office I further reviewed her abdominal/pelvic CT scan done at [facility] 04/05/2019 which showed the diverticulitis as mentioned above, however, a 3.6 cm ill-defined hypodense mass of the left lobe of the liver with subcentimeter possible exophytic nodule over the posterior right lobe of the liver was identified. I will ask Dr. Marline Backboneeman to review the abdominal/pelvic CT films and to verify if the patient should undergo an abdominal MRI for further evaluation regarding the larger lesion.  Past Medical History:  Diagnosis Date   Anemia    Arthritis    Gait disturbance    Rotator cuff tear    DR Magnus IvanBLACKMAN   Stroke [facility](HCC)    Vision abnormalities    Past Surgical History:  Procedure Laterality Date   ORIF CLAVICULAR FRACTURE Left    Current Outpatient Medications on File Prior to Visit  Medication Sig Dispense Refill   clopidogrel (PLAVIX) 75 MG tablet Take 75 mg by mouth daily.      Cyanocobalamin (VITAMIN B 12 PO) Take  1,000 mg by mouth daily.     Lactobacillus (FLORAJEN WOMEN PO) Take by mouth daily.     loratadine (CLARITIN) 10 MG tablet Take 10 mg by mouth every morning.     Melatonin 10 MG TABS Take by mouth daily.     mirabegron ER (MYRBETRIQ) 50 MG TB24 tablet Take 1 tablet (50 mg total) by mouth daily. 90 tablet 3   naproxen sodium (ALEVE) 220 MG tablet Take 440 mg by mouth 2 (two) times daily as needed (FOR PAIN).     sertraline (ZOLOFT) 25 MG tablet Take 25 mg by mouth daily.     No current  facility-administered medications on file prior to visit.   No Known Allergies Family History  Problem Relation Age of Onset   Stroke Mother    Transient ischemic attack Mother    Stroke Father    Heart attack Father    Mental illness Brother    Breast cancer Daughter 41       [name]   Social History   Socioeconomic History   Marital status: Widowed    Spouse name: Not on file   Number of children: Not on file   Years of education: Not on file   Highest education level: Not on file  Occupational History   Not on file  Tobacco Use   Smoking status: Never Smoker   Smokeless tobacco: Never Used  Substance and Sexual Activity   Alcohol use: Yes    Comment: ONE GLASS DAILY   Drug use: Never   Sexual activity: Not on file  Other Topics Concern   Not on file  Social History Narrative   Not on file   Social Determinants of Health   Financial Resource Strain:    Difficulty of Paying Living Expenses: Not on file  Food Insecurity:    Worried About [place] in the Last Year: Not on file   Ran Out of Food in the Last Year: Not on file  Transportation Needs:    Lack of Transportation (Medical): Not on file   Lack of Transportation (Non-Medical): Not on file  Physical Activity:    Days of Exercise per Week: Not on file   Minutes of Exercise per Session: Not on file  Stress:    Feeling of Stress : Not on file  Social Connections:    Frequency of Communication with Friends and Family: Not on file   Frequency of Social Gatherings with Friends and Family: Not on file   Attends Religious Services: Not on file   Active Member of Clubs or Organizations: Not on file   Attends Archivist Meetings: Not on file   Marital Status: Not on file  Intimate Partner Violence:    Fear of Current or Ex-Partner: Not on file   Emotionally Abused: Not on file   Physically Abused: Not on file   Sexually Abused: Not on file    Review of Systems  Gen: Denies fever, sweats  or chills. No weight loss.  CV: Denies chest pain, palpitations or edema. Resp: Denies cough, shortness of breath of hemoptysis.  GI: See HPI. GU : Frequent urination, denies dysuria or hematuria. MS: Right shoulder pain secondary to rotator cuff injury, arthritis to her hands. Derm: Denies rash, itchiness, skin lesions or unhealing ulcers. Psych: Denies depression, anxiety or memory loss. Heme: Denies bruising, bleeding. Neuro:  Denies headaches, dizziness or paresthesias. Endo:  Denies any problems with DM, thyroid or adrenal function.  Objective:   Physical Exam  BP (!) 178/97 (BP Location: Right Arm, Patient Position: Sitting, Cuff Size: Large)   Pulse 78   Temp (!) 97.3 F (36.3 C) (Oral)   Ht 5\' 5"  (1.651 m)   Wt 149 lb 8 oz (67.8 kg)   BMI 24.88 kg/m  General: 78 year old female well-developed alert in no acute distress Eyes: Sclera nonicteric, conjunctiva pink Neck: Supple, no thyromegaly or lymphadenopathy Heart: Regular rate and rhythm, no murmurs Lungs: Breath sounds clear throughout Abdomen: Soft, nontender, no masses or organomegaly Extremities: No edema Neuro: Alert and orient x4, no focal deficits    Assessment & Plan:   47.  78 year old female with diarrhea which has resolved.  Diverticulitis 04/04/2018. -Continue Florajen probiotic once daily for the next 2 to 3 months -Patient will call our office if her diarrhea recurs -We will request a copy of her colonoscopy completed by Dr. 04/06/2018 in 2019 for further review  2.  History of a CVA on Plavix  3.  Rheumatoid arthritis, lower extremity weakness by rheumatology -Advised the patient not to take Naproxen or any NSAID while she is taking Prednisone   4.  History of anemia.  No obvious signs of active GI bleeding. -Check CBC, CMP and CRP today -If her CBC indicates anemia she will require further GI evaluation ASAP -Further follow-up to be determined after the above lab results reviewed  5.   Abdominal/pelvic CT 04/04/2018 identified a 3.6 cm ill-defined hypodense mass over the left lobe of the liver with subcentimeter possible exophytic nodule over the posterior right lobe of the liver.  This finding was not reviewed with the patient at the time of her office visit.  I will asked Dr. 04/06/2018 to review the CT films and to verify if the patient should proceed with an abdominal MRI for further evaluation at this time.  I will call the patient with Dr. Karilyn Cota input and I will review her lab results with her at that time as well.   GI attending note.  CT reviewed. It shows at least a moderate size sliding hiatal hernia and 3.6 cm mass involving left hepatic lobe and a small exophytic nodule along posterior aspect of the right lobe. Large lesion possibly is hemangioma.  No prior history of liver disease but need to rule out other etiologies. We will schedule patient for MRI of liver with and without contrast.

## 2019-04-02 NOTE — Patient Instructions (Signed)
1.  Call our office if your diarrhea recurs   2.  Continue Florajen probiotic once daily for at least the next 2 to 3 months  3.  Do not take Naproxen/Aleve or any nonsteroidal anti-inflammatory medication while you are taking Prednisone.  4.  I will request a copy of your colonoscopy done by Dr. Ladona Horns in 2019 for further review  5.  Complete the provided lab order today, if your lab results indicate anemia you will need further GI evaluation

## 2019-04-06 NOTE — Progress Notes (Signed)
I called the patient regarding her CTAP 04/04/2018 which showed a 3.6 cm left liver lesion. She stated she underwent 2 abdominal MRI's at [facility] as ordered by her pcp. She stated the abd MRI's did not show any worrisome lesions as discussed by her PCP. I will request a copy of her abd MRIs from [facility] and will scan into her chart.

## 2019-04-28 ENCOUNTER — Other Ambulatory Visit: Payer: Self-pay | Admitting: Neurology

## 2019-06-09 ENCOUNTER — Telehealth: Payer: Self-pay | Admitting: Orthopedic Surgery

## 2019-06-16 ENCOUNTER — Other Ambulatory Visit: Payer: Self-pay

## 2019-06-16 ENCOUNTER — Ambulatory Visit: Payer: Medicare PPO | Admitting: Orthopaedic Surgery

## 2019-06-16 DIAGNOSIS — G8929 Other chronic pain: Secondary | ICD-10-CM

## 2019-06-16 DIAGNOSIS — M25511 Pain in right shoulder: Secondary | ICD-10-CM

## 2019-06-16 MED ORDER — LIDOCAINE HCL 1 % IJ SOLN
3.0000 mL | INTRAMUSCULAR | Status: AC | PRN
  Administered 2019-06-16: 10:00:00 3 mL

## 2019-06-16 MED ORDER — METHYLPREDNISOLONE ACETATE 40 MG/ML IJ SUSP
40.0000 mg | INTRAMUSCULAR | Status: AC | PRN
  Administered 2019-06-16: 10:00:00 40 mg via INTRA_ARTICULAR

## 2019-06-16 NOTE — Progress Notes (Signed)
Office Visit Note   Patient: Sarah Page           Date of Birth: Sep 17, 1940           MRN: [id number] Visit Date: 06/16/2019              Requested by: Selinda Flavin, MD [address] [place],  [place] [address] PCP: Selinda Flavin, MD   Assessment & Plan: Visit Diagnoses:  1. Chronic right shoulder pain     Plan: I did agree with trying a steroid injection in her right shoulder and she tolerated this well.  She has had these before and is fully aware of the risk and benefits of steroid injections.  All questions concerns were answered and addressed.  Follow-up can be as needed.  Follow-Up Instructions: Return if symptoms worsen or fail to improve.   Orders:  Orders Placed This Encounter  Procedures  . Large Joint Inj   No orders of the defined types were placed in this encounter.     Procedures: Large Joint Inj: R subacromial bursa on 06/16/2019 10:27 AM Indications: pain and diagnostic evaluation Details: 22 G 1.5 in needle  Arthrogram: No  Medications: 3 mL lidocaine 1 %; 40 mg methylPREDNISolone acetate 40 MG/ML Outcome: tolerated well, no immediate complications Procedure, treatment alternatives, risks and benefits explained, specific risks discussed. Consent was given by the patient. Immediately prior to procedure a time out was called to verify the correct patient, procedure, equipment, support staff and site/side marked as required. Patient was prepped and draped in the usual sterile fashion.       Clinical Data: No additional findings.   Subjective: Chief Complaint  Patient presents with  . Right Shoulder - Pain  The patient is a very pleasant 79 year old active female who is right-hand dominant.  She did sustain a right shoulder greater tuberosity fracture in 2019.  She eventually healed the fracture.  She is worked on range of motion of her shoulder and that is improved.  She does get pain and inflammation that shoulder and comes in today requesting  steroid injection.  She has been able to perform activities daily living much better.  She tries to be as active as possible.  She has had no other acute change in her medical status.  HPI  Review of Systems She currently denies any headache, chest pain, shortness of breath, fever, chills, nausea, vomiting  Objective: Vital Signs: There were no vitals taken for this visit.  Physical Exam She is alert and oriented x3 and in no acute distress Ortho Exam Examination of her right shoulder does show much improved abduction as well as forward flexion.  It is almost full.  It is weak. Specialty Comments:  No specialty comments available.  Imaging: No results found.   PMFS History: Patient Active Problem List   Diagnosis Date Noted  . Diarrhea 04/02/2019  . Acute diverticulitis 04/04/2018  . Hx of completed stroke 04/04/2018  . AKI (acute kidney injury) (HCC) 04/04/2018  . Leukocytosis 04/04/2018  . Urine incontinence 04/04/2018  . Allergic rhinitis 04/04/2018  . Dehydration 04/04/2018  . Nondisplaced fracture of greater tuberosity of right humerus with routine healing 10/17/2017   Past Medical History:  Diagnosis Date  . Anemia   . Arthritis   . Gait disturbance   . Rotator cuff tear    DR Magnus Ivan  . Stroke (HCC)   . Vision abnormalities     Family History  Problem Relation Age of Onset  .  Stroke Mother   . Transient ischemic attack Mother   . Stroke Father   . Heart attack Father   . Mental illness Brother   . Breast cancer Daughter 50       [name]    Past Surgical History:  Procedure Laterality Date  . ORIF CLAVICULAR FRACTURE Left    Social History   Occupational History  . Not on file  Tobacco Use  . Smoking status: Never Smoker  . Smokeless tobacco: Never Used  Substance and Sexual Activity  . Alcohol use: Yes    Comment: ONE GLASS DAILY  . Drug use: Never  . Sexual activity: Not on file

## 2019-07-21 NOTE — Telephone Encounter (Signed)
disregard

## 2019-10-16 IMAGING — CT CT RENAL STONE PROTOCOL
2 of 4 series · 16 of 46 positions shown, 18 images · non-contrast
Comparison: None.

CLINICAL DATA: Increased urination with incontinence and dysuria 3
days.

EXAM:
CT ABDOMEN AND PELVIS WITHOUT CONTRAST
TECHNIQUE: Multidetector CT imaging of the abdomen and pelvis was performed
following the standard protocol without IV contrast.

[Series 2: axial st · axial · 0.71mm/px · z∈[-451,-71]mm · 13 of 84 slices shown, 15 images]
[im 4/84  soft-tissue]
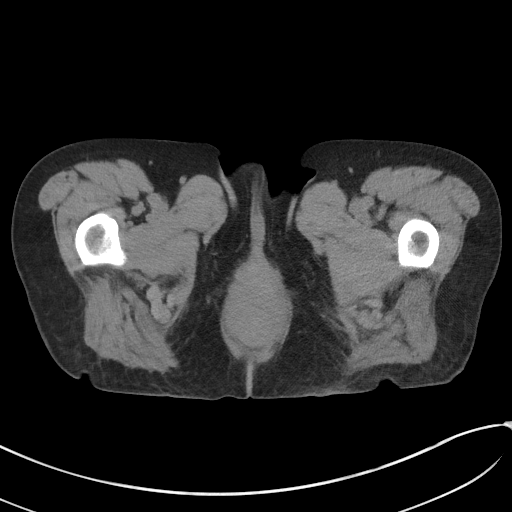
[im 4/84  bone]
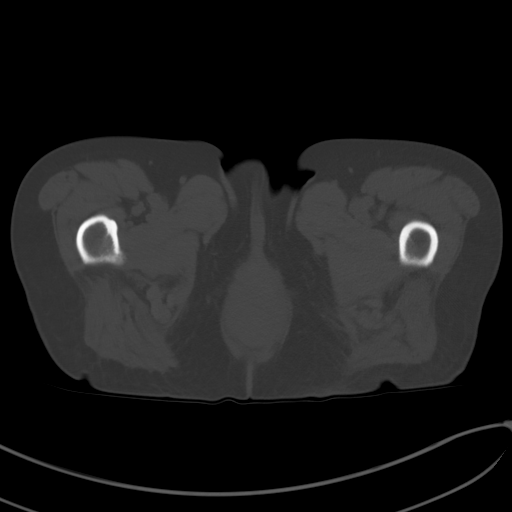
[im 11/84  soft-tissue]
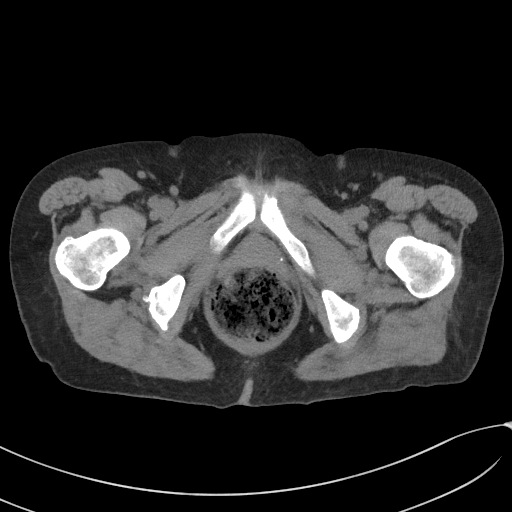
[im 18/84  soft-tissue]
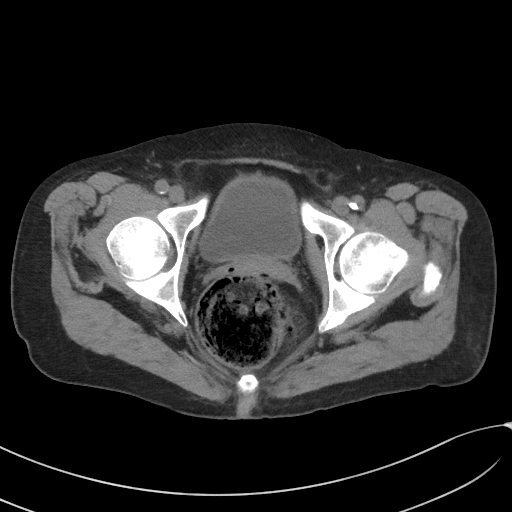
[im 25/84  soft-tissue]
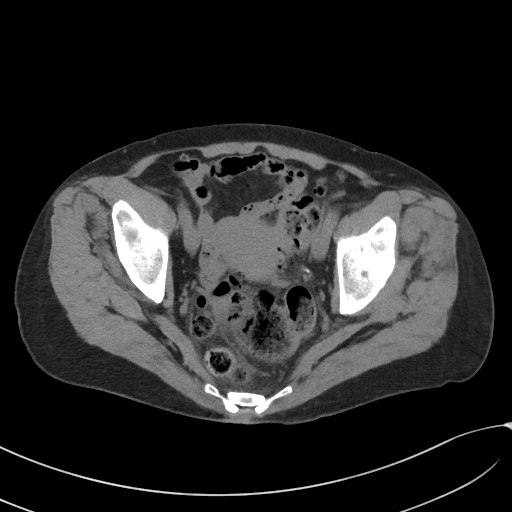
[im 28/84  soft-tissue]
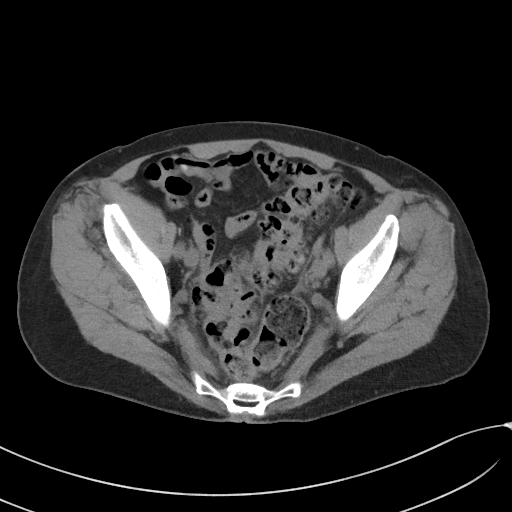
[im 35/84  soft-tissue]
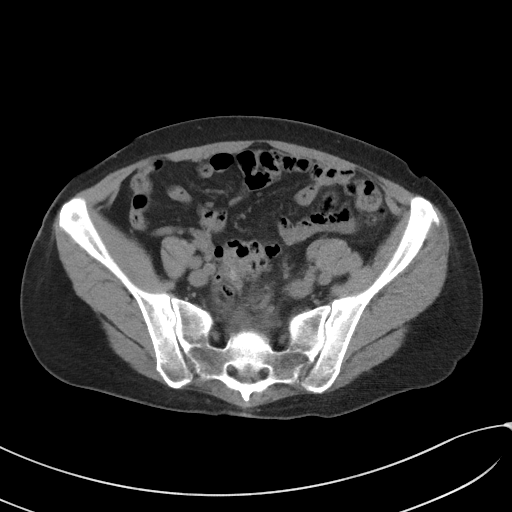
[im 42/84  soft-tissue]
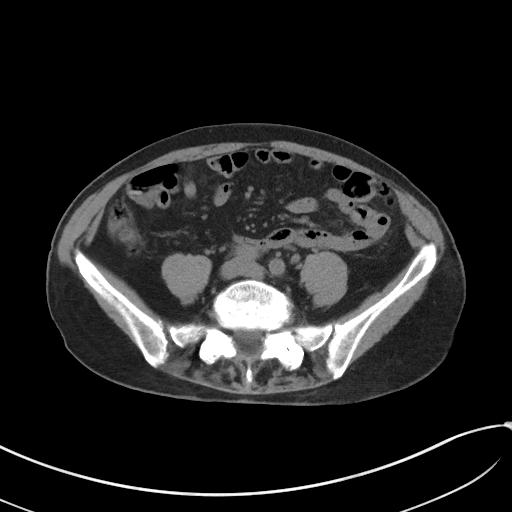
[im 49/84  soft-tissue]
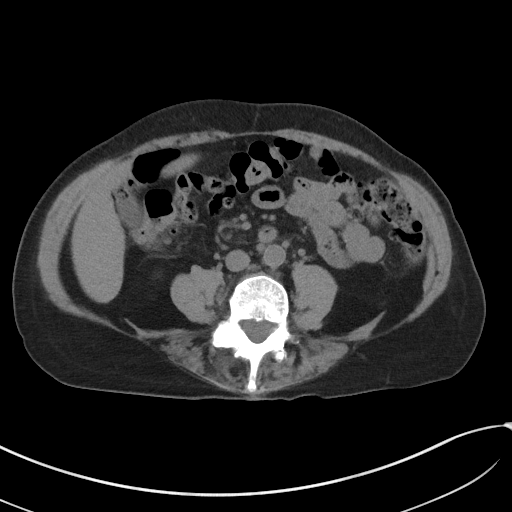
[im 56/84  soft-tissue]
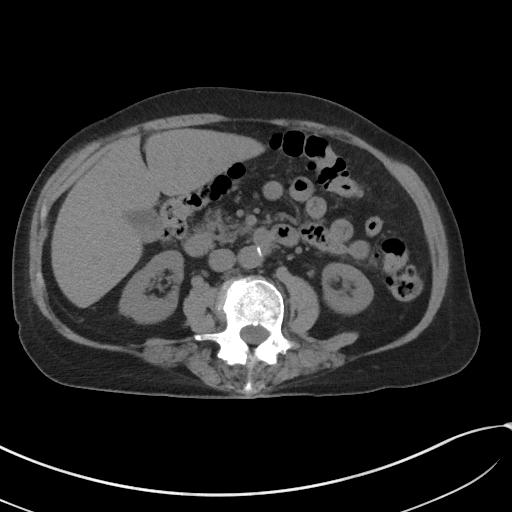
[im 56/84  bone]
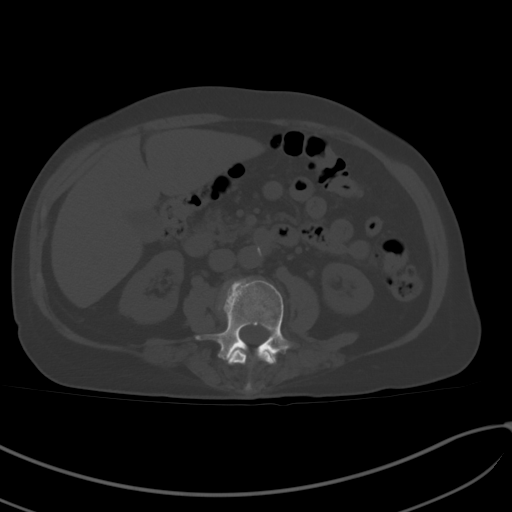
[im 59/84  soft-tissue]
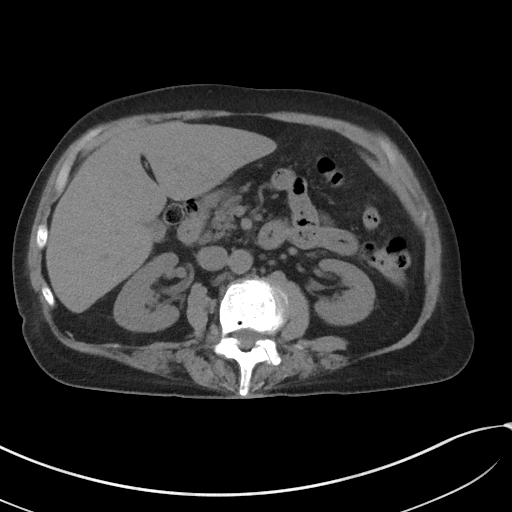
[im 66/84  soft-tissue]
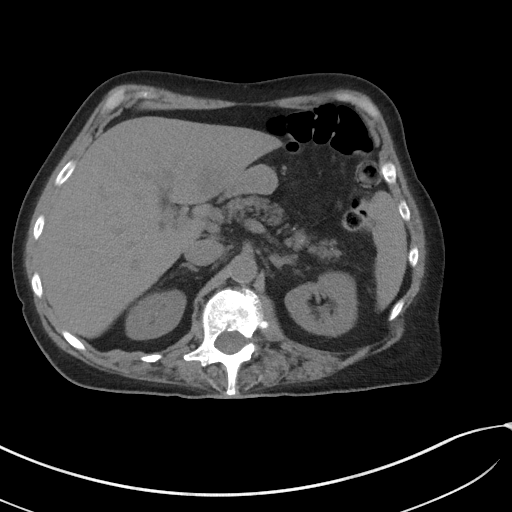
[im 73/84  soft-tissue]
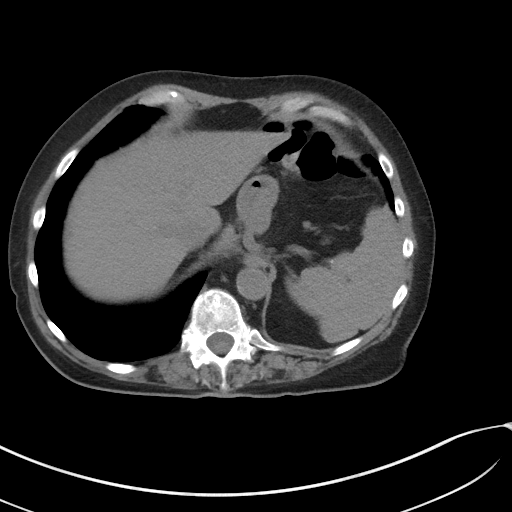
[im 80/84  soft-tissue]
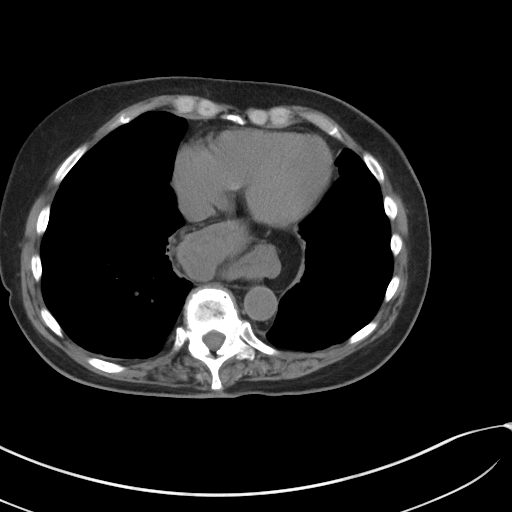

[Series 5: coronal st · coronal · 0.70mm/px · 3 of 79 slices shown]
[im 27/79  soft-tissue]
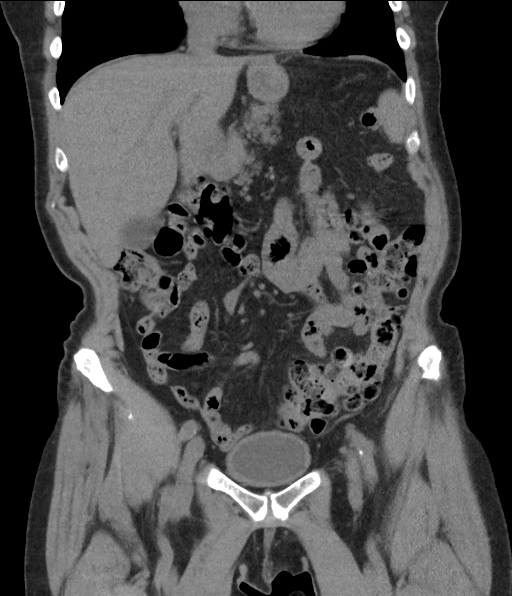
[im 35/79  soft-tissue]
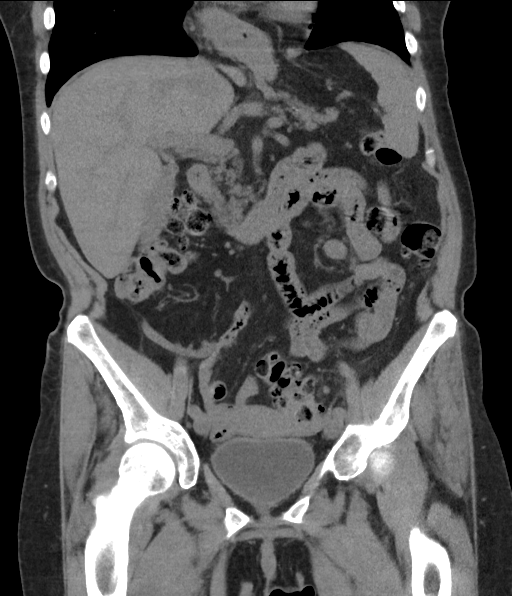
[im 44/79  soft-tissue]
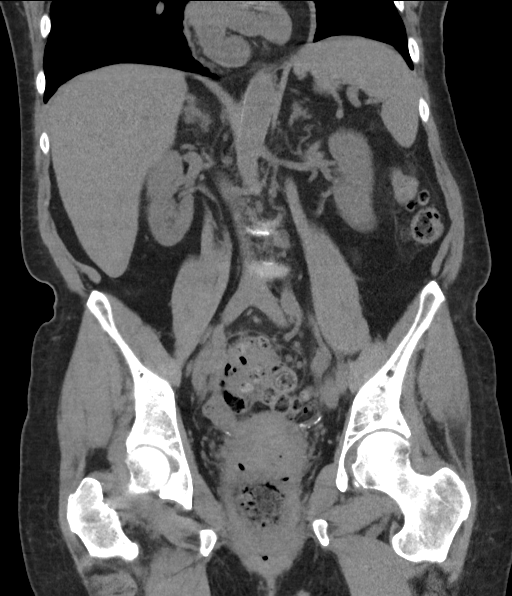

[16 of 46 positions shown; findings below may reference images not displayed]

FINDINGS: Lower chest: Lung bases are normal. Moderate to large hiatal hernia
is present.

Hepatobiliary: Minimal noncalcified gallstones versus sludge.
Ill-defined 3.6 cm hypodense mass over the lateral segment left lobe
of the liver. Possible tiny subcentimeter exophytic nodule over the
posterior right lobe of the liver. No ductal dilatation.

Pancreas: Normal.

Spleen: Normal.

Adrenals/Urinary Tract: Adrenal glands are normal. Kidneys are
normal in size without hydronephrosis or nephrolithiasis. Ureters
and bladder are normal.

Stomach/Bowel: Moderate-to-large hiatal hernia as the stomach is
otherwise unremarkable. Small bowel is normal. Appendix is normal.
Diverticulosis of the colon most prominent over the sigmoid colon.
There is inflammatory change within the pericolonic fat adjacent the
sigmoid colon in the midline lower abdomen/pelvis likely
representing acute diverticulitis. No extraluminal air or
diverticular abscess. Moderate fecal retention over the colon..

Vascular/Lymphatic: Minimal calcified plaque over the abdominal
aorta. No significant adenopathy.

Reproductive: Normal.

Other: Tiny amount of free fluid over the lower abdomen/pelvis.

Musculoskeletal: Moderate degenerative changes of the spine with
multilevel disc disease throughout the lumbar spine. Curvature of
the lumbar spine convex left.
IMPRESSION: Moderate diverticulosis of the colon with evidence of acute
diverticulitis of the sigmoid colon in the midline lower
abdomen/pelvis. No evidence of perforation or diverticular abscess.

3.6 cm ill-defined hypodense mass over the left lobe of the liver
with subcentimeter possible exophytic nodule over the posterior
right lobe of the liver. The larger mass is indeterminate and
concerning for primary versus metastatic neoplasm. Recommend further
characterization with MRI.

Moderate to large hiatal hernia.

Aortic Atherosclerosis (ABMXP-K33.3).

## 2019-12-27 ENCOUNTER — Other Ambulatory Visit: Payer: Self-pay

## 2019-12-27 ENCOUNTER — Ambulatory Visit
Admission: EM | Admit: 2019-12-27 | Discharge: 2019-12-27 | Disposition: A | Discharge status: Home / Self Care | Payer: Medicare PPO

## 2020-02-15 ENCOUNTER — Other Ambulatory Visit: Payer: Self-pay | Admitting: Family Medicine

## 2020-02-15 DIAGNOSIS — Z1231 Encounter for screening mammogram for malignant neoplasm of breast: Secondary | ICD-10-CM

## 2020-03-28 ENCOUNTER — Ambulatory Visit: Payer: Medicare PPO

## 2020-05-04 ENCOUNTER — Ambulatory Visit: Payer: Medicare PPO

## 2020-05-11 ENCOUNTER — Ambulatory Visit: Payer: Medicare PPO

## 2020-06-05 ENCOUNTER — Other Ambulatory Visit: Payer: Self-pay

## 2020-06-05 ENCOUNTER — Emergency Department (HOSPITAL_COMMUNITY)
Admission: EM | Admit: 2020-06-05 | Discharge: 2020-06-05 | Disposition: A | Discharge status: Home / Self Care | Payer: Medicare PPO | Attending: Emergency Medicine | Admitting: Emergency Medicine

## 2020-06-05 ENCOUNTER — Emergency Department (HOSPITAL_COMMUNITY): Payer: Medicare PPO

## 2020-06-05 ENCOUNTER — Encounter (HOSPITAL_COMMUNITY): Payer: Self-pay | Admitting: Emergency Medicine

## 2020-06-05 DIAGNOSIS — R270 Ataxia, unspecified: Secondary | ICD-10-CM | POA: Insufficient documentation

## 2020-06-05 DIAGNOSIS — R42 Dizziness and giddiness: Secondary | ICD-10-CM | POA: Diagnosis present

## 2020-06-05 DIAGNOSIS — Z7902 Long term (current) use of antithrombotics/antiplatelets: Secondary | ICD-10-CM | POA: Insufficient documentation

## 2020-06-05 DIAGNOSIS — Z8673 Personal history of transient ischemic attack (TIA), and cerebral infarction without residual deficits: Secondary | ICD-10-CM | POA: Insufficient documentation

## 2020-06-05 LAB — CBC WITH DIFFERENTIAL/PLATELET
Abs Immature Granulocytes: 0.01 10*3/uL (ref 0.00–0.07)
Basophils Absolute: 0.1 10*3/uL (ref 0.0–0.1)
Basophils Relative: 1 %
Eosinophils Absolute: 0.1 10*3/uL (ref 0.0–0.5)
Eosinophils Relative: 1 %
HCT: 38.5 % (ref 36.0–46.0)
Hemoglobin: 12.9 g/dL (ref 12.0–15.0)
Immature Granulocytes: 0 %
Lymphocytes Relative: 13 %
Lymphs Abs: 0.6 10*3/uL — ABNORMAL LOW (ref 0.7–4.0)
MCH: 32.3 pg (ref 26.0–34.0)
MCHC: 33.5 g/dL (ref 30.0–36.0)
MCV: 96.5 fL (ref 80.0–100.0)
Monocytes Absolute: 0.4 10*3/uL (ref 0.1–1.0)
Monocytes Relative: 9 %
Neutro Abs: 3.3 10*3/uL (ref 1.7–7.7)
Neutrophils Relative %: 76 %
Platelets: 251 10*3/uL (ref 150–400)
RBC: 3.99 MIL/uL (ref 3.87–5.11)
RDW: 13.6 % (ref 11.5–15.5)
WBC: 4.4 10*3/uL (ref 4.0–10.5)
nRBC: 0 % (ref 0.0–0.2)

## 2020-06-05 LAB — BASIC METABOLIC PANEL
Anion gap: 7 (ref 5–15)
BUN: 14 mg/dL (ref 8–23)
CO2: 26 mmol/L (ref 22–32)
Calcium: 9.1 mg/dL (ref 8.9–10.3)
Chloride: 99 mmol/L (ref 98–111)
Creatinine, Ser: 0.84 mg/dL (ref 0.44–1.00)
GFR, Estimated: >60 mL/min (ref >60)
Glucose, Bld: 126 mg/dL — ABNORMAL HIGH (ref 70–99)
Potassium: 4.3 mmol/L (ref 3.5–5.1)
Sodium: 132 mmol/L — ABNORMAL LOW (ref 135–145)

## 2020-06-05 MED ORDER — IOHEXOL 350 MG/ML SOLN
75.0000 mL | Freq: Once | INTRAVENOUS | Status: AC | PRN
  Administered 2020-06-05: 75 mL via INTRAVENOUS

## 2020-06-05 MED ORDER — LORAZEPAM 1 MG PO TABS: 1.0000 mg | ORAL_TABLET | Freq: Once | ORAL | 0 refills | Status: DC | PRN

## 2020-06-05 NOTE — Discharge Instructions (Addendum)
Your work-up today was entirely reassuring.  However, would like for you to obtain an MRI here tomorrow.  I prescribed you a single dose Ativan for you to take immediately prior to your MRI to help with your anxiety and difficulty tolerating the imaging.  Please follow-up with your primary care provider for ongoing evaluation and management.  Please have a low threshold to return to the ED or seek immediate medical attention should you experience any new or worsening symptoms.

## 2020-06-05 NOTE — ED Notes (Signed)
Entered room and introduced self to patient. Pt appears to be resting in bed, respirations are even and unlabored with equal chest rise and fall. Bed is locked in the lowest position, side rails x2, call bell within reach. Pt educated on call light use and hourly rounding, verbalized understanding and in agreement at this time. All questions and concerns voiced addressed. Refreshments offered and provided per patient request.  

## 2020-06-05 NOTE — ED Notes (Addendum)
PA at bedside for ambulation trail.   7:08 PM ED provider at bedside at this time.

## 2020-06-05 NOTE — ED Provider Notes (Signed)
[facility] EMERGENCY DEPARTMENT Provider Note   CSN: [id number] Arrival date & time: 06/05/20  1617     History Chief Complaint  Patient presents with  . Dizziness    Sarah Page is a 80 y.o. 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  I reviewed patient's medical record and on her most recent physical therapy evaluation 05/31/2020 noted that her static and dynamic balance has continued to improve further observation.  Recommend continued PT intervention for strength, endurance, and improved balance.  On my examination, patient reports that she felt perfectly fine last evening when she went to bed.  When she woke up at 6 AM this morning, which is unusually early for her, she attempted to ambulate to the bathroom and was experiencing profound disequilibrium.  Patient reports that she had to put her hands out against the wall to hold herself up and keep her from falling to the floor.  She denied any associated palpitations, shortness of breath, chest pain, headache, room spinning dizziness, nausea or diaphoresis, blurred vision, or other symptoms.  She crawled back into bed and laid around for about an hour at which point she got up again and continued to experience similar disequilibrium.  However, she eventually admitted to the kitchen and was able to make coffee, sit down, and read the newspaper without too much difficulty.  She has since been able to ambulate at or near her baseline.  She states that she does not typically enjoy coming to the hospital, but spoke with her primary care provider who advised her to come to the ED for evaluation given her significant deviation from her baseline.   HPI     Past Medical History:  Diagnosis Date  . Anemia   . Arthritis    . Gait disturbance   . Rotator cuff tear    DR Magnus Ivan  . Stroke (HCC)   . Vision abnormalities     Patient Active Problem List   Diagnosis Date Noted  . Diarrhea 04/02/2019  . Acute diverticulitis 04/04/2018  . Hx of completed stroke 04/04/2018  . AKI (acute kidney injury) (HCC) 04/04/2018  . Leukocytosis 04/04/2018  . Urine incontinence 04/04/2018  . Allergic rhinitis 04/04/2018  . Dehydration 04/04/2018  . Nondisplaced fracture of greater tuberosity of right humerus with routine healing 10/17/2017    Past Surgical History:  Procedure Laterality Date  . ORIF CLAVICULAR FRACTURE Left      OB History   No obstetric history on file.     Family History  Problem Relation Age of Onset  . Stroke Mother   . Transient ischemic attack Mother   . Stroke Father   . Heart attack Father   . Mental illness Brother   . Breast cancer Daughter 101       Sarah Page    Social History   Tobacco Use  . Smoking status: Never Smoker  . Smokeless tobacco: Never Used  Vaping Use  . Vaping Use: Never used  Substance Use Topics  . Alcohol use: Yes    Comment: ONE GLASS DAILY  . Drug use: Never    Home Medications Prior to Admission medications   Medication Sig Start Date End Date Taking? Authorizing Provider  LORazepam (ATIVAN) 1 MG tablet Take 1 tablet (1 mg total) by mouth once as needed  for up to 1 dose for anxiety (To take before your MRI). 06/05/20  Yes Lorelee New, PA-C  clopidogrel (PLAVIX) 75 MG tablet Take 75 mg by mouth daily.  03/10/18   [provider]  Cyanocobalamin (VITAMIN B 12 PO) Take 1,000 mg by mouth daily.    [provider]  Lactobacillus [facility] WOMEN PO) Take by mouth daily.    [provider]  loratadine (CLARITIN) 10 MG tablet Take 10 mg by mouth every morning.    [provider]  Melatonin 10 MG TABS Take by mouth daily.    [provider]  mirabegron ER (MYRBETRIQ) 50 MG TB24 tablet Take 1 tablet (50 mg  total) by mouth daily. 05/05/18   York Spaniel, MD  naproxen sodium (ALEVE) 220 MG tablet Take 440 mg by mouth 2 (two) times daily as needed (FOR PAIN).    [provider]  sertraline (ZOLOFT) 25 MG tablet Take 25 mg by mouth daily.    [provider]    Allergies    Patient has no known allergies.  Review of Systems   Review of Systems  All other systems reviewed and are negative.   Physical Exam Updated Vital Signs BP (!) 173/67   Pulse 66   Temp (!) 97.5 F (36.4 C) (Oral)   Resp 18   Ht 5\' 2"  (1.575 m)   Wt 59 kg   SpO2 98%   BMI 23.78 kg/m   Physical Exam Vitals and nursing note reviewed. Exam conducted with a chaperone present.  Constitutional:      Appearance: Normal appearance.  HENT:     Head: Normocephalic and atraumatic.     Mouth/Throat:     Comments: Uvula rises midline. Eyes:     General: No scleral icterus.    Extraocular Movements: Extraocular movements intact.     Conjunctiva/sclera: Conjunctivae normal.     Pupils: Pupils are equal, round, and reactive to light.     Comments: PERRL and EOM intact.  No nystagmus.  Cardiovascular:     Rate and Rhythm: Normal rate and regular rhythm.     Pulses: Normal pulses.     Heart sounds: Normal heart sounds.  Pulmonary:     Effort: Pulmonary effort is normal. No respiratory distress.     Breath sounds: Normal breath sounds. No wheezing or rales.  Abdominal:     General: Abdomen is flat. There is no distension.     Palpations: Abdomen is soft.     Tenderness: There is no abdominal tenderness.  Musculoskeletal:        General: Normal range of motion.     Comments: Able to move all extremities with full range of motion and strength intact against resistance.  5 of 5 strength bilaterally.  Peripheral pulses and sensation intact  Skin:    General: Skin is dry.     Capillary Refill: Capillary refill takes less than 2 seconds.  Neurological:     General: No focal deficit present.      Mental Status: She is alert and oriented to person, place, and time.     GCS: GCS eye subscore is 4. GCS verbal subscore is 5. GCS motor subscore is 6.     Cranial Nerves: No cranial nerve deficit.     Sensory: No sensory deficit.     Motor: No weakness.     Coordination: Coordination normal.     Gait: Gait normal.     Comments: CN II through  XII grossly intact.  Strength UE and BE 5/5 symmetric.  Sensation intact throughout.  No facial droop.  No dysarthria.  Uvula rises midline.  PERRL and EOM intact.  No nystagmus.  Answering questions appropriately.  Alert and oriented x3.  Negative Romberg and cerebellar exams.  Psychiatric:        Mood and Affect: Mood normal.        Behavior: Behavior normal.        Thought Content: Thought content normal.     ED Results / Procedures / Treatments   Labs (all labs ordered are listed, but only abnormal results are displayed) Labs Reviewed  CBC WITH DIFFERENTIAL/PLATELET - Abnormal; Notable for the following components:      Result Value   Lymphs Abs 0.6 (*)    All other components within normal limits  BASIC METABOLIC PANEL - Abnormal; Notable for the following components:   Sodium 132 (*)    Glucose, Bld 126 (*)    All other components within normal limits    EKG EKG Interpretation  Date/Time:  Sunday June 05 2020 17:27:59 EST Ventricular Rate:  73 PR Interval:    QRS Duration: 91 QT Interval:  404 QTC Calculation: 446 R Axis:   79 Text Interpretation: Sinus rhythm Nonspecific T abnormalities, lateral leads No significant change since last tracing Confirmed by Benjiman Core [phone]) on 06/05/2020 6:24:44 PM   Radiology CT Angio Head W or Wo Contrast  Result Date: 06/05/2020 CLINICAL DATA:  Dizziness.  Symptoms beginning today. EXAM: CT ANGIOGRAPHY HEAD AND NECK TECHNIQUE: Multidetector CT imaging of the head and neck was performed using the standard protocol during bolus administration of intravenous contrast. Multiplanar CT  image reconstructions and MIPs were obtained to evaluate the vascular anatomy. Carotid stenosis measurements (when applicable) are obtained utilizing NASCET criteria, using the distal internal carotid diameter as the denominator. CONTRAST:  75mL OMNIPAQUE IOHEXOL 350 MG/ML SOLN COMPARISON:  None. FINDINGS: CT HEAD FINDINGS Brain: Brain atrophy. Chronic small-vessel ischemic changes of the hemispheric white matter. Old lacunar infarction in the right basal ganglia and internal capsule. No sign of acute infarction, mass lesion, hemorrhage, hydrocephalus or extra-axial collection. Vascular: There is atherosclerotic calcification of the major vessels at the base of the brain. Skull: Negative Sinuses: Inflammatory changes of the left maxillary sinus. Orbits: Negative Review of the MIP images confirms the above findings CTA NECK FINDINGS Aortic arch: Minimal aortic atherosclerosis. Branching pattern is normal without origin stenosis. Right carotid system: Common carotid artery widely patent to the bifurcation. Carotid bifurcation widely patent without soft or calcified plaque. Cervical ICA tortuous but widely patent. Left carotid system: Common carotid artery widely patent to the bifurcation. Carotid bifurcation is normal without soft or calcified plaque. Cervical ICA is normal. Vertebral arteries: Both vertebral artery origins are widely patent. The right vertebral artery is dominant. Both vertebral arteries are normal through the cervical region to the foramen magnum. Skeleton: Ordinary cervical spondylosis. Other neck: No mass or adenopathy. Upper chest: Mild scarring at the lung apices. Review of the MIP images confirms the above findings CTA HEAD FINDINGS Anterior circulation: Both internal carotid arteries widely patent through the skull base and siphon regions. Minimal siphon atherosclerotic calcification but no stenosis. The anterior and middle cerebral vessels are patent without stenosis, aneurysm or vascular  malformation. No large or medium vessel occlusion. Posterior circulation: Both vertebral arteries widely patent to the basilar. Both posteroinferior cerebellar arteries show flow. No basilar stenosis. Superior cerebellar and posterior cerebral arteries are patent. The  patient does show some atherosclerotic narrowing in the PCA branches. Venous sinuses: Patent and normal. Anatomic variants: None significant. Review of the MIP images confirms the above findings IMPRESSION: 1. No acute large or medium vessel occlusion. 2. Minimal aortic atherosclerosis. 3. No carotid bifurcation disease. No acute posterior circulation pathology seen to explain the dizziness. 4. Moderate atherosclerotic narrowing of the PCA branches but without evidence of severe stenosis or occlusion. 5. Head CT shows atrophy and chronic small-vessel ischemic changes. Old lacunar infarction in the right basal ganglia and internal capsule. Aortic Atherosclerosis (ICD10-I70.0). Electronically Signed   By: Paulina Fusi M.D.   On: 06/05/2020 19:00   CT Angio Neck W and/or Wo Contrast  Result Date: 06/05/2020 CLINICAL DATA:  Dizziness.  Symptoms beginning today. EXAM: CT ANGIOGRAPHY HEAD AND NECK TECHNIQUE: Multidetector CT imaging of the head and neck was performed using the standard protocol during bolus administration of intravenous contrast. Multiplanar CT image reconstructions and MIPs were obtained to evaluate the vascular anatomy. Carotid stenosis measurements (when applicable) are obtained utilizing NASCET criteria, using the distal internal carotid diameter as the denominator. CONTRAST:  75mL OMNIPAQUE IOHEXOL 350 MG/ML SOLN COMPARISON:  None. FINDINGS: CT HEAD FINDINGS Brain: Brain atrophy. Chronic small-vessel ischemic changes of the hemispheric white matter. Old lacunar infarction in the right basal ganglia and internal capsule. No sign of acute infarction, mass lesion, hemorrhage, hydrocephalus or extra-axial collection. Vascular: There is  atherosclerotic calcification of the major vessels at the base of the brain. Skull: Negative Sinuses: Inflammatory changes of the left maxillary sinus. Orbits: Negative Review of the MIP images confirms the above findings CTA NECK FINDINGS Aortic arch: Minimal aortic atherosclerosis. Branching pattern is normal without origin stenosis. Right carotid system: Common carotid artery widely patent to the bifurcation. Carotid bifurcation widely patent without soft or calcified plaque. Cervical ICA tortuous but widely patent. Left carotid system: Common carotid artery widely patent to the bifurcation. Carotid bifurcation is normal without soft or calcified plaque. Cervical ICA is normal. Vertebral arteries: Both vertebral artery origins are widely patent. The right vertebral artery is dominant. Both vertebral arteries are normal through the cervical region to the foramen magnum. Skeleton: Ordinary cervical spondylosis. Other neck: No mass or adenopathy. Upper chest: Mild scarring at the lung apices. Review of the MIP images confirms the above findings CTA HEAD FINDINGS Anterior circulation: Both internal carotid arteries widely patent through the skull base and siphon regions. Minimal siphon atherosclerotic calcification but no stenosis. The anterior and middle cerebral vessels are patent without stenosis, aneurysm or vascular malformation. No large or medium vessel occlusion. Posterior circulation: Both vertebral arteries widely patent to the basilar. Both posteroinferior cerebellar arteries show flow. No basilar stenosis. Superior cerebellar and posterior cerebral arteries are patent. The patient does show some atherosclerotic narrowing in the PCA branches. Venous sinuses: Patent and normal. Anatomic variants: None significant. Review of the MIP images confirms the above findings IMPRESSION: 1. No acute large or medium vessel occlusion. 2. Minimal aortic atherosclerosis. 3. No carotid bifurcation disease. No acute  posterior circulation pathology seen to explain the dizziness. 4. Moderate atherosclerotic narrowing of the PCA branches but without evidence of severe stenosis or occlusion. 5. Head CT shows atrophy and chronic small-vessel ischemic changes. Old lacunar infarction in the right basal ganglia and internal capsule. Aortic Atherosclerosis (ICD10-I70.0). Electronically Signed   By: Paulina Fusi M.D.   On: 06/05/2020 19:00    Procedures Procedures   Medications Ordered in ED Medications  iohexol (OMNIPAQUE) 350 MG/ML injection 75  mL (75 mLs Intravenous Contrast Given 06/05/20 1803)    ED Course  I have reviewed the triage vital signs and the nursing notes.  Pertinent labs & imaging results that were available during my care of the patient were reviewed by me and considered in my medical decision making (see chart for details).    MDM Rules/Calculators/A&P                          Jordan Caraveo was evaluated in Emergency Department on 06/05/2020 for the symptoms described in the history of present illness. She was evaluated in the context of the global COVID-19 pandemic, which necessitated consideration that the patient might be at risk for infection with the SARS-CoV-2 virus that causes COVID-19. Institutional protocols and algorithms that pertain to the evaluation of patients at risk for COVID-19 are in a state of rapid change based on information released by regulatory bodies including the CDC and federal and state organizations. These policies and algorithms were followed during the patient's care in the ED.  I personally reviewed patient's medical chart and all notes from triage and staff during today's encounter. I have also ordered and reviewed all labs and imaging that I felt to be medically necessary in the evaluation of this patient's complaints and with consideration of their physical exam. If needed, translation services were available and utilized.   Patient's history and physical  exam is concerning for TIA versus infarct.  Neurologic exam is entirely benign on my examination.  However, still need to ambulate patient and evaluate for listing / ataxia.  ABCD2 score of 4 with 4.1% 2-day stroke risk.  Lower suspicion for BPPV or other peripheral vertigo.  No hearing loss.  Low suspicion for cardiac etiology given lack of any chest pain, shortness of breath, or palpitations.  She states that it had subsided, but her daughter at bedside states that she is concerned that her gait is still unsteady.  Will obtain CTA head and neck and then ambulate here in the ED.  Unfortunately without MRI services here in the ED this evening, but can plan for MRI tomorrow.  Patient is adamant that she is back at her baseline and states that "there is no way I am staying overnight".   Laboratory work-up is unremarkable.  No anemia or leukocytosis concerning for infection.  BMP without significant derangement.  Patient denies any urinary symptoms.  No suprapubic abdominal tenderness.  Discontinued UA is patient is feeling improved and reassured by remainder of work-up.    CTA head and neck are personally reviewed and without any acute large or medium vessel occlusion.  No acute posterior circulation pathology that might explain patient's dizziness. I ambulated patient personally in the hallway and she was able to walk 20 feet without any listing or ataxia.  She was able to fall no specific line without any deviation.  She did not have to use her hands for support.  Given her ABCD 2 score, she would benefit from admission for MRI and other vascular/embolic imaging.  However, patient is adamant that she does not want to be admitted and would like to go home.  Encouraged close outpatient follow-up.  Carotid ultrasound from 2019 was without any stenosis.  Discussed with Dr. Rubin Payor who personally evaluated patient and agrees with assessment and plan.     Final Clinical Impression(s) / ED Diagnoses Final  diagnoses:  Ataxia    Rx / DC Orders ED Discharge Orders  Ordered    MR BRAIN WO CONTRAST        06/05/20 1926    LORazepam (ATIVAN) 1 MG tablet  Once PRN        06/05/20 1926           Elvera Maria 06/05/20 Dara Lords, MD 06/05/20 2244

## 2020-06-05 NOTE — ED Triage Notes (Signed)
Pt states she felt dizzy upon waking at 0600 this morning.  She last felt fine at 2300 when she went to bed last night.  She states she feels like she just needs some extra help by holding onto the walls.  Pt denies weakness, changes in vision, change in sensation or strength from side to side.

## 2020-06-06 ENCOUNTER — Emergency Department (HOSPITAL_COMMUNITY)
Admission: RE | Admit: 2020-06-06 | Discharge: 2020-06-06 | Disposition: A | Discharge status: Home / Self Care | Payer: Medicare PPO | Source: Ambulatory Visit | Attending: Physician Assistant | Admitting: Physician Assistant

## 2020-06-22 ENCOUNTER — Ambulatory Visit: Payer: Medicare PPO

## 2020-08-10 ENCOUNTER — Ambulatory Visit: Payer: Medicare PPO

## 2020-09-27 ENCOUNTER — Ambulatory Visit
Admission: RE | Admit: 2020-09-27 | Discharge: 2020-09-27 | Disposition: A | Discharge status: Home / Self Care | Payer: Medicare PPO | Source: Ambulatory Visit | Attending: Family Medicine | Admitting: Family Medicine

## 2020-09-27 ENCOUNTER — Other Ambulatory Visit: Payer: Self-pay | Admitting: Family Medicine

## 2020-09-27 ENCOUNTER — Other Ambulatory Visit: Payer: Self-pay

## 2020-09-27 DIAGNOSIS — Z1231 Encounter for screening mammogram for malignant neoplasm of breast: Secondary | ICD-10-CM

## 2020-10-21 DIAGNOSIS — T887XXA Unspecified adverse effect of drug or medicament, initial encounter: Secondary | ICD-10-CM | POA: Diagnosis not present

## 2020-10-21 DIAGNOSIS — R5383 Other fatigue: Secondary | ICD-10-CM | POA: Diagnosis not present

## 2020-10-28 DIAGNOSIS — R5383 Other fatigue: Secondary | ICD-10-CM | POA: Diagnosis not present

## 2020-10-28 DIAGNOSIS — M2550 Pain in unspecified joint: Secondary | ICD-10-CM | POA: Diagnosis not present

## 2020-10-28 DIAGNOSIS — M06041 Rheumatoid arthritis without rheumatoid factor, right hand: Secondary | ICD-10-CM | POA: Diagnosis not present

## 2020-10-28 DIAGNOSIS — Z6821 Body mass index (BMI) 21.0-21.9, adult: Secondary | ICD-10-CM | POA: Diagnosis not present

## 2020-10-28 DIAGNOSIS — R748 Abnormal levels of other serum enzymes: Secondary | ICD-10-CM | POA: Diagnosis not present

## 2020-10-28 DIAGNOSIS — M791 Myalgia, unspecified site: Secondary | ICD-10-CM | POA: Diagnosis not present

## 2020-10-28 DIAGNOSIS — M810 Age-related osteoporosis without current pathological fracture: Secondary | ICD-10-CM | POA: Diagnosis not present

## 2020-11-20 DIAGNOSIS — E039 Hypothyroidism, unspecified: Secondary | ICD-10-CM | POA: Diagnosis not present

## 2020-11-20 DIAGNOSIS — I10 Essential (primary) hypertension: Secondary | ICD-10-CM | POA: Diagnosis not present

## 2020-11-20 DIAGNOSIS — E782 Mixed hyperlipidemia: Secondary | ICD-10-CM | POA: Diagnosis not present

## 2020-11-24 DIAGNOSIS — T887XXA Unspecified adverse effect of drug or medicament, initial encounter: Secondary | ICD-10-CM | POA: Diagnosis not present

## 2020-11-24 DIAGNOSIS — I10 Essential (primary) hypertension: Secondary | ICD-10-CM | POA: Diagnosis not present

## 2020-11-24 DIAGNOSIS — R32 Unspecified urinary incontinence: Secondary | ICD-10-CM | POA: Diagnosis not present

## 2020-12-05 DIAGNOSIS — M06041 Rheumatoid arthritis without rheumatoid factor, right hand: Secondary | ICD-10-CM | POA: Diagnosis not present

## 2020-12-20 DIAGNOSIS — H01114 Allergic dermatitis of left upper eyelid: Secondary | ICD-10-CM | POA: Diagnosis not present

## 2021-01-18 DIAGNOSIS — M6281 Muscle weakness (generalized): Secondary | ICD-10-CM | POA: Diagnosis not present

## 2021-01-18 DIAGNOSIS — R293 Abnormal posture: Secondary | ICD-10-CM | POA: Diagnosis not present

## 2021-01-18 DIAGNOSIS — I69954 Hemiplegia and hemiparesis following unspecified cerebrovascular disease affecting left non-dominant side: Secondary | ICD-10-CM | POA: Diagnosis not present

## 2021-01-18 DIAGNOSIS — Z9181 History of falling: Secondary | ICD-10-CM | POA: Diagnosis not present

## 2021-01-18 DIAGNOSIS — M47816 Spondylosis without myelopathy or radiculopathy, lumbar region: Secondary | ICD-10-CM | POA: Diagnosis not present

## 2021-01-18 DIAGNOSIS — M069 Rheumatoid arthritis, unspecified: Secondary | ICD-10-CM | POA: Diagnosis not present

## 2021-01-18 DIAGNOSIS — R296 Repeated falls: Secondary | ICD-10-CM | POA: Diagnosis not present

## 2021-01-18 DIAGNOSIS — M545 Low back pain, unspecified: Secondary | ICD-10-CM | POA: Diagnosis not present

## 2021-01-18 DIAGNOSIS — R2689 Other abnormalities of gait and mobility: Secondary | ICD-10-CM | POA: Diagnosis not present

## 2021-01-23 DIAGNOSIS — M6281 Muscle weakness (generalized): Secondary | ICD-10-CM | POA: Diagnosis not present

## 2021-01-23 DIAGNOSIS — Z9181 History of falling: Secondary | ICD-10-CM | POA: Diagnosis not present

## 2021-01-23 DIAGNOSIS — M47816 Spondylosis without myelopathy or radiculopathy, lumbar region: Secondary | ICD-10-CM | POA: Diagnosis not present

## 2021-01-23 DIAGNOSIS — R293 Abnormal posture: Secondary | ICD-10-CM | POA: Diagnosis not present

## 2021-01-23 DIAGNOSIS — R296 Repeated falls: Secondary | ICD-10-CM | POA: Diagnosis not present

## 2021-01-23 DIAGNOSIS — I69954 Hemiplegia and hemiparesis following unspecified cerebrovascular disease affecting left non-dominant side: Secondary | ICD-10-CM | POA: Diagnosis not present

## 2021-01-23 DIAGNOSIS — M069 Rheumatoid arthritis, unspecified: Secondary | ICD-10-CM | POA: Diagnosis not present

## 2021-01-23 DIAGNOSIS — R2689 Other abnormalities of gait and mobility: Secondary | ICD-10-CM | POA: Diagnosis not present

## 2021-01-23 DIAGNOSIS — M545 Low back pain, unspecified: Secondary | ICD-10-CM | POA: Diagnosis not present

## 2021-01-26 DIAGNOSIS — I69954 Hemiplegia and hemiparesis following unspecified cerebrovascular disease affecting left non-dominant side: Secondary | ICD-10-CM | POA: Diagnosis not present

## 2021-01-26 DIAGNOSIS — M6281 Muscle weakness (generalized): Secondary | ICD-10-CM | POA: Diagnosis not present

## 2021-01-26 DIAGNOSIS — M47816 Spondylosis without myelopathy or radiculopathy, lumbar region: Secondary | ICD-10-CM | POA: Diagnosis not present

## 2021-01-26 DIAGNOSIS — Z9181 History of falling: Secondary | ICD-10-CM | POA: Diagnosis not present

## 2021-01-26 DIAGNOSIS — R296 Repeated falls: Secondary | ICD-10-CM | POA: Diagnosis not present

## 2021-01-26 DIAGNOSIS — R2689 Other abnormalities of gait and mobility: Secondary | ICD-10-CM | POA: Diagnosis not present

## 2021-01-26 DIAGNOSIS — R293 Abnormal posture: Secondary | ICD-10-CM | POA: Diagnosis not present

## 2021-01-26 DIAGNOSIS — M069 Rheumatoid arthritis, unspecified: Secondary | ICD-10-CM | POA: Diagnosis not present

## 2021-01-26 DIAGNOSIS — M545 Low back pain, unspecified: Secondary | ICD-10-CM | POA: Diagnosis not present

## 2021-01-30 DIAGNOSIS — I69954 Hemiplegia and hemiparesis following unspecified cerebrovascular disease affecting left non-dominant side: Secondary | ICD-10-CM | POA: Diagnosis not present

## 2021-01-30 DIAGNOSIS — M47816 Spondylosis without myelopathy or radiculopathy, lumbar region: Secondary | ICD-10-CM | POA: Diagnosis not present

## 2021-01-30 DIAGNOSIS — M545 Low back pain, unspecified: Secondary | ICD-10-CM | POA: Diagnosis not present

## 2021-01-30 DIAGNOSIS — M069 Rheumatoid arthritis, unspecified: Secondary | ICD-10-CM | POA: Diagnosis not present

## 2021-01-30 DIAGNOSIS — Z9181 History of falling: Secondary | ICD-10-CM | POA: Diagnosis not present

## 2021-01-30 DIAGNOSIS — R2689 Other abnormalities of gait and mobility: Secondary | ICD-10-CM | POA: Diagnosis not present

## 2021-01-30 DIAGNOSIS — R296 Repeated falls: Secondary | ICD-10-CM | POA: Diagnosis not present

## 2021-01-30 DIAGNOSIS — R293 Abnormal posture: Secondary | ICD-10-CM | POA: Diagnosis not present

## 2021-01-30 DIAGNOSIS — M6281 Muscle weakness (generalized): Secondary | ICD-10-CM | POA: Diagnosis not present

## 2021-02-01 DIAGNOSIS — R350 Frequency of micturition: Secondary | ICD-10-CM | POA: Diagnosis not present

## 2021-02-01 DIAGNOSIS — N3946 Mixed incontinence: Secondary | ICD-10-CM | POA: Diagnosis not present

## 2021-02-02 DIAGNOSIS — M6281 Muscle weakness (generalized): Secondary | ICD-10-CM | POA: Diagnosis not present

## 2021-02-02 DIAGNOSIS — R296 Repeated falls: Secondary | ICD-10-CM | POA: Diagnosis not present

## 2021-02-02 DIAGNOSIS — M545 Low back pain, unspecified: Secondary | ICD-10-CM | POA: Diagnosis not present

## 2021-02-02 DIAGNOSIS — M069 Rheumatoid arthritis, unspecified: Secondary | ICD-10-CM | POA: Diagnosis not present

## 2021-02-02 DIAGNOSIS — R293 Abnormal posture: Secondary | ICD-10-CM | POA: Diagnosis not present

## 2021-02-02 DIAGNOSIS — M47816 Spondylosis without myelopathy or radiculopathy, lumbar region: Secondary | ICD-10-CM | POA: Diagnosis not present

## 2021-02-02 DIAGNOSIS — I69954 Hemiplegia and hemiparesis following unspecified cerebrovascular disease affecting left non-dominant side: Secondary | ICD-10-CM | POA: Diagnosis not present

## 2021-02-02 DIAGNOSIS — R2689 Other abnormalities of gait and mobility: Secondary | ICD-10-CM | POA: Diagnosis not present

## 2021-02-02 DIAGNOSIS — Z9181 History of falling: Secondary | ICD-10-CM | POA: Diagnosis not present

## 2021-02-07 DIAGNOSIS — M069 Rheumatoid arthritis, unspecified: Secondary | ICD-10-CM | POA: Diagnosis not present

## 2021-02-07 DIAGNOSIS — I69954 Hemiplegia and hemiparesis following unspecified cerebrovascular disease affecting left non-dominant side: Secondary | ICD-10-CM | POA: Diagnosis not present

## 2021-02-07 DIAGNOSIS — M47816 Spondylosis without myelopathy or radiculopathy, lumbar region: Secondary | ICD-10-CM | POA: Diagnosis not present

## 2021-02-07 DIAGNOSIS — M6281 Muscle weakness (generalized): Secondary | ICD-10-CM | POA: Diagnosis not present

## 2021-02-07 DIAGNOSIS — R293 Abnormal posture: Secondary | ICD-10-CM | POA: Diagnosis not present

## 2021-02-07 DIAGNOSIS — R296 Repeated falls: Secondary | ICD-10-CM | POA: Diagnosis not present

## 2021-02-07 DIAGNOSIS — R2689 Other abnormalities of gait and mobility: Secondary | ICD-10-CM | POA: Diagnosis not present

## 2021-02-07 DIAGNOSIS — M545 Low back pain, unspecified: Secondary | ICD-10-CM | POA: Diagnosis not present

## 2021-02-07 DIAGNOSIS — Z9181 History of falling: Secondary | ICD-10-CM | POA: Diagnosis not present

## 2021-02-09 DIAGNOSIS — Z9181 History of falling: Secondary | ICD-10-CM | POA: Diagnosis not present

## 2021-02-09 DIAGNOSIS — M545 Low back pain, unspecified: Secondary | ICD-10-CM | POA: Diagnosis not present

## 2021-02-09 DIAGNOSIS — R2689 Other abnormalities of gait and mobility: Secondary | ICD-10-CM | POA: Diagnosis not present

## 2021-02-09 DIAGNOSIS — I69954 Hemiplegia and hemiparesis following unspecified cerebrovascular disease affecting left non-dominant side: Secondary | ICD-10-CM | POA: Diagnosis not present

## 2021-02-09 DIAGNOSIS — R296 Repeated falls: Secondary | ICD-10-CM | POA: Diagnosis not present

## 2021-02-09 DIAGNOSIS — M6281 Muscle weakness (generalized): Secondary | ICD-10-CM | POA: Diagnosis not present

## 2021-02-09 DIAGNOSIS — M47816 Spondylosis without myelopathy or radiculopathy, lumbar region: Secondary | ICD-10-CM | POA: Diagnosis not present

## 2021-02-09 DIAGNOSIS — M069 Rheumatoid arthritis, unspecified: Secondary | ICD-10-CM | POA: Diagnosis not present

## 2021-02-09 DIAGNOSIS — R293 Abnormal posture: Secondary | ICD-10-CM | POA: Diagnosis not present

## 2021-02-13 DIAGNOSIS — M8580 Other specified disorders of bone density and structure, unspecified site: Secondary | ICD-10-CM | POA: Diagnosis not present

## 2021-02-13 DIAGNOSIS — M2550 Pain in unspecified joint: Secondary | ICD-10-CM | POA: Diagnosis not present

## 2021-02-13 DIAGNOSIS — M159 Polyosteoarthritis, unspecified: Secondary | ICD-10-CM | POA: Diagnosis not present

## 2021-02-13 DIAGNOSIS — Z6821 Body mass index (BMI) 21.0-21.9, adult: Secondary | ICD-10-CM | POA: Diagnosis not present

## 2021-02-13 DIAGNOSIS — R768 Other specified abnormal immunological findings in serum: Secondary | ICD-10-CM | POA: Diagnosis not present

## 2021-02-13 DIAGNOSIS — M0609 Rheumatoid arthritis without rheumatoid factor, multiple sites: Secondary | ICD-10-CM | POA: Diagnosis not present

## 2021-02-13 DIAGNOSIS — M545 Low back pain, unspecified: Secondary | ICD-10-CM | POA: Diagnosis not present

## 2021-02-16 DIAGNOSIS — M47816 Spondylosis without myelopathy or radiculopathy, lumbar region: Secondary | ICD-10-CM | POA: Diagnosis not present

## 2021-02-16 DIAGNOSIS — I69954 Hemiplegia and hemiparesis following unspecified cerebrovascular disease affecting left non-dominant side: Secondary | ICD-10-CM | POA: Diagnosis not present

## 2021-02-16 DIAGNOSIS — M545 Low back pain, unspecified: Secondary | ICD-10-CM | POA: Diagnosis not present

## 2021-02-16 DIAGNOSIS — Z9181 History of falling: Secondary | ICD-10-CM | POA: Diagnosis not present

## 2021-02-16 DIAGNOSIS — R2689 Other abnormalities of gait and mobility: Secondary | ICD-10-CM | POA: Diagnosis not present

## 2021-02-16 DIAGNOSIS — M069 Rheumatoid arthritis, unspecified: Secondary | ICD-10-CM | POA: Diagnosis not present

## 2021-02-16 DIAGNOSIS — M6281 Muscle weakness (generalized): Secondary | ICD-10-CM | POA: Diagnosis not present

## 2021-02-16 DIAGNOSIS — R293 Abnormal posture: Secondary | ICD-10-CM | POA: Diagnosis not present

## 2021-02-16 DIAGNOSIS — R296 Repeated falls: Secondary | ICD-10-CM | POA: Diagnosis not present

## 2021-02-21 DIAGNOSIS — Z9181 History of falling: Secondary | ICD-10-CM | POA: Diagnosis not present

## 2021-02-21 DIAGNOSIS — M545 Low back pain, unspecified: Secondary | ICD-10-CM | POA: Diagnosis not present

## 2021-02-21 DIAGNOSIS — M6281 Muscle weakness (generalized): Secondary | ICD-10-CM | POA: Diagnosis not present

## 2021-02-21 DIAGNOSIS — R293 Abnormal posture: Secondary | ICD-10-CM | POA: Diagnosis not present

## 2021-02-21 DIAGNOSIS — R296 Repeated falls: Secondary | ICD-10-CM | POA: Diagnosis not present

## 2021-02-21 DIAGNOSIS — R2689 Other abnormalities of gait and mobility: Secondary | ICD-10-CM | POA: Diagnosis not present

## 2021-02-24 DIAGNOSIS — M545 Low back pain, unspecified: Secondary | ICD-10-CM | POA: Diagnosis not present

## 2021-02-24 DIAGNOSIS — M6281 Muscle weakness (generalized): Secondary | ICD-10-CM | POA: Diagnosis not present

## 2021-02-24 DIAGNOSIS — R293 Abnormal posture: Secondary | ICD-10-CM | POA: Diagnosis not present

## 2021-02-24 DIAGNOSIS — R296 Repeated falls: Secondary | ICD-10-CM | POA: Diagnosis not present

## 2021-02-24 DIAGNOSIS — Z9181 History of falling: Secondary | ICD-10-CM | POA: Diagnosis not present

## 2021-02-24 DIAGNOSIS — R2689 Other abnormalities of gait and mobility: Secondary | ICD-10-CM | POA: Diagnosis not present

## 2021-02-27 DIAGNOSIS — M6281 Muscle weakness (generalized): Secondary | ICD-10-CM | POA: Diagnosis not present

## 2021-02-27 DIAGNOSIS — R2689 Other abnormalities of gait and mobility: Secondary | ICD-10-CM | POA: Diagnosis not present

## 2021-02-27 DIAGNOSIS — R296 Repeated falls: Secondary | ICD-10-CM | POA: Diagnosis not present

## 2021-02-27 DIAGNOSIS — M545 Low back pain, unspecified: Secondary | ICD-10-CM | POA: Diagnosis not present

## 2021-02-27 DIAGNOSIS — R293 Abnormal posture: Secondary | ICD-10-CM | POA: Diagnosis not present

## 2021-02-27 DIAGNOSIS — Z9181 History of falling: Secondary | ICD-10-CM | POA: Diagnosis not present

## 2021-03-02 DIAGNOSIS — R2689 Other abnormalities of gait and mobility: Secondary | ICD-10-CM | POA: Diagnosis not present

## 2021-03-02 DIAGNOSIS — Z9181 History of falling: Secondary | ICD-10-CM | POA: Diagnosis not present

## 2021-03-02 DIAGNOSIS — R293 Abnormal posture: Secondary | ICD-10-CM | POA: Diagnosis not present

## 2021-03-02 DIAGNOSIS — M545 Low back pain, unspecified: Secondary | ICD-10-CM | POA: Diagnosis not present

## 2021-03-02 DIAGNOSIS — M6281 Muscle weakness (generalized): Secondary | ICD-10-CM | POA: Diagnosis not present

## 2021-03-02 DIAGNOSIS — R296 Repeated falls: Secondary | ICD-10-CM | POA: Diagnosis not present

## 2021-03-06 DIAGNOSIS — R2689 Other abnormalities of gait and mobility: Secondary | ICD-10-CM | POA: Diagnosis not present

## 2021-03-06 DIAGNOSIS — R296 Repeated falls: Secondary | ICD-10-CM | POA: Diagnosis not present

## 2021-03-06 DIAGNOSIS — M545 Low back pain, unspecified: Secondary | ICD-10-CM | POA: Diagnosis not present

## 2021-03-06 DIAGNOSIS — R293 Abnormal posture: Secondary | ICD-10-CM | POA: Diagnosis not present

## 2021-03-06 DIAGNOSIS — Z9181 History of falling: Secondary | ICD-10-CM | POA: Diagnosis not present

## 2021-03-06 DIAGNOSIS — M6281 Muscle weakness (generalized): Secondary | ICD-10-CM | POA: Diagnosis not present

## 2021-03-08 DIAGNOSIS — R293 Abnormal posture: Secondary | ICD-10-CM | POA: Diagnosis not present

## 2021-03-08 DIAGNOSIS — M545 Low back pain, unspecified: Secondary | ICD-10-CM | POA: Diagnosis not present

## 2021-03-08 DIAGNOSIS — R2689 Other abnormalities of gait and mobility: Secondary | ICD-10-CM | POA: Diagnosis not present

## 2021-03-08 DIAGNOSIS — R296 Repeated falls: Secondary | ICD-10-CM | POA: Diagnosis not present

## 2021-03-08 DIAGNOSIS — Z9181 History of falling: Secondary | ICD-10-CM | POA: Diagnosis not present

## 2021-03-08 DIAGNOSIS — M6281 Muscle weakness (generalized): Secondary | ICD-10-CM | POA: Diagnosis not present

## 2021-03-20 DIAGNOSIS — R262 Difficulty in walking, not elsewhere classified: Secondary | ICD-10-CM | POA: Diagnosis not present

## 2021-03-20 DIAGNOSIS — M545 Low back pain, unspecified: Secondary | ICD-10-CM | POA: Diagnosis not present

## 2021-03-20 DIAGNOSIS — Z9181 History of falling: Secondary | ICD-10-CM | POA: Diagnosis not present

## 2021-03-20 DIAGNOSIS — R531 Weakness: Secondary | ICD-10-CM | POA: Diagnosis not present

## 2021-03-31 DIAGNOSIS — R350 Frequency of micturition: Secondary | ICD-10-CM | POA: Diagnosis not present

## 2021-03-31 DIAGNOSIS — N3946 Mixed incontinence: Secondary | ICD-10-CM | POA: Diagnosis not present

## 2021-05-16 DIAGNOSIS — M0609 Rheumatoid arthritis without rheumatoid factor, multiple sites: Secondary | ICD-10-CM | POA: Diagnosis not present

## 2021-06-08 ENCOUNTER — Ambulatory Visit (INDEPENDENT_AMBULATORY_CARE_PROVIDER_SITE_OTHER): Payer: Medicare PPO | Admitting: Gastroenterology

## 2021-06-08 ENCOUNTER — Encounter (INDEPENDENT_AMBULATORY_CARE_PROVIDER_SITE_OTHER): Payer: Self-pay | Admitting: Gastroenterology

## 2021-06-08 ENCOUNTER — Other Ambulatory Visit: Payer: Self-pay

## 2021-06-08 VITALS — BP 146/74 | HR 116 | Temp 98.2°F | Ht 62.0 in | Wt 123.0 lb

## 2021-06-08 DIAGNOSIS — R195 Other fecal abnormalities: Secondary | ICD-10-CM | POA: Diagnosis not present

## 2021-06-08 NOTE — Patient Instructions (Signed)
It was very nice to meet you!  Please keep a food journal for the next 1-2 weeks to see if there are any correlations in things you are eating and your symptoms.  You can continue to use imodium as needed for your symptoms, please do not exceed 4 tablets per day  I am providing a low FODMAP diet for you to look at as this can sometimes be helpful in avoiding triggers that can worse diarrhea and bowel irritability.   You can also try lactobacillus rhamnosus probiotic to see if this provides results.   You can call with an update on symptoms are doing in the next few weeks. If you start to have worsening diarrhea or more frequent episodes, please let me know. We can consider stool studies at that time.  Follow up 3 months

## 2021-06-08 NOTE — Progress Notes (Signed)
Referring Provider: Garnette Gunner, MD Primary Care Physician:  Garnette Gunner, MD Primary GI Physician: Karilyn Cota  Chief Complaint  Patient presents with   Diarrhea    Diarrhea for a few months. Taking imodium one whole tablet as needed. Sometimes has a hard time making it to the bathroom.    HPI:   Sarah Page is a 81 y.o. female with past medical history of anemia, arthritis, stroke, visual impairment and diverticulitis.  Patient presenting today for Diarrhea.   Patient last seen in office December 2020 for diarrhea and lower abdominal pain. At that time she had started Florajen probiotic which seemed to have resolved her symptoms.   Today, patient states that she has had loose stools for the past few months. She reports that after she had a stroke 3 years ago, she had some severe issues with not being able to hold her urine. She saw urology for this and was started on some medications which have almost resolved these issues. She states that she has had diarrhea for the past 2-3 months. She reports that she has issues with fecal incontinence if she does not get to the bathroom quick enough. She reports that she typically has 1 BM per day that is loose, usually in the morning. She cannot remember the last time she had a solid stool. She denies any actual abdominal pain but does feel her stomach rolling and states that it sounds  a little more noisy than normal. She is going to the [facility] 3 days a week to help with her RA. Recently had labs that showed a lower WBC. She states that she is taking the Florajen probiotic and takes maybe 1 imodium per day if that, this provides good relief of her symptoms. She cannot seem to pinpoint anything that seems to trigger her symptoms. She does report that she eats a smaller amount but has a lower appetite as she has gotten older. She does endorse that she had a fall back on her birthday while she was out walking down the street and has now had to  stop taking walks which has really taken a toll on her as she feels she cannot do the same activities she once did and remain safe. She also reports the recent death of a neighbor which was hard on her.  She denies any blood in her stools or black stools. No nausea or vomiting. She has had no changes in her medications or recent antibiotics. She does report hx of lactose intolerance which she takes lactaid for with good results.   NSAID use:no nsaid use Social hx:no tobacco use, uses 2 oz of wine a few times a week   Last Colonoscopy:08/04/15 diverticula throughout colon and internal hemorrhoids   Past Medical History:  Diagnosis Date   Anemia    Arthritis    Gait disturbance    Rotator cuff tear    DR Magnus Ivan   Stroke [facility])    Vision abnormalities     Past Surgical History:  Procedure Laterality Date   ORIF CLAVICULAR FRACTURE Left     Current Outpatient Medications  Medication Sig Dispense Refill   amLODipine-olmesartan (AZOR) 5-20 MG tablet Take by mouth.     clopidogrel (PLAVIX) 75 MG tablet Take 75 mg by mouth daily.      Cyanocobalamin (VITAMIN B 12 PO) Take 1,000 mg by mouth daily.     ezetimibe (ZETIA) 10 MG tablet 1 tablet     hydroxychloroquine (PLAQUENIL) 200  MG tablet Take by mouth.     Lactobacillus (FLORAJEN WOMEN PO) Take by mouth daily.     leflunomide (ARAVA) 20 MG tablet Take by mouth.     Melatonin 10 MG TABS Take by mouth daily.     sertraline (ZOLOFT) 25 MG tablet Take 25 mg by mouth daily.     solifenacin (VESICARE) 5 MG tablet 1 tablet     Vibegron (GEMTESA) 75 MG TABS 1 tablet     No current facility-administered medications for this visit.    Allergies as of 06/08/2021   (No Known Allergies)    Family History  Problem Relation Age of Onset   Stroke Mother    Transient ischemic attack Mother    Stroke Father    Heart attack Father    Mental illness Brother    Breast cancer Daughter [phone]       [name]    Social History   Socioeconomic  History   Marital status: Widowed    Spouse name: Not on file   Number of children: Not on file   Years of education: Not on file   Highest education level: Not on file  Occupational History   Not on file  Tobacco Use   Smoking status: Never   Smokeless tobacco: Never  Vaping Use   Vaping Use: Never used  Substance and Sexual Activity   Alcohol use: Yes    Comment: occasional wine   Drug use: Never   Sexual activity: Not on file  Other Topics Concern   Not on file  Social History Narrative   Not on file   Social Determinants of Health   Financial Resource Strain: Not on file  Food Insecurity: Not on file  Transportation Needs: Not on file  Physical Activity: Not on file  Stress: Not on file  Social Connections: Not on file   Review of systems General: negative for malaise, night sweats, fever, chills, weight loss Neck: Negative for lumps, goiter, pain and significant neck swelling Resp: Negative for cough, wheezing, dyspnea at rest CV: Negative for chest pain, leg swelling, palpitations, orthopnea GI: denies melena, hematochezia, nausea, vomiting, constipation, dysphagia, odyonophagia, early satiety or unintentional weight loss. +loose stools MSK: Negative for joint pain or swelling, back pain, and muscle pain. Derm: Negative for itching or rash Psych: Denies depression, anxiety, memory loss, confusion. No homicidal or suicidal ideation.  Heme: Negative for prolonged bleeding, bruising easily, and swollen nodes. Endocrine: Negative for cold or heat intolerance, polyuria, polydipsia and goiter. Neuro: negative for tremor, gait imbalance, syncope and seizures. The remainder of the review of systems is noncontributory.  Physical Exam: BP (!) 146/74 (BP Location: Right Arm, Patient Position: Sitting, Cuff Size: Normal)    Pulse (!) 116    Temp 98.2 F (36.8 C) (Oral)    Ht 5\' 2"  (1.575 m)    Wt 123 lb (55.8 kg)    BMI 22.50 kg/m  General:   Alert and oriented. No  distress noted. Pleasant and cooperative.  Head:  Normocephalic and atraumatic. Eyes:  Conjuctiva clear without scleral icterus. Mouth:  Oral mucosa pink and moist. Good dentition. No lesions. Heart: Normal rate and rhythm, s1 and s2 heart sounds present.  Lungs: Clear lung sounds in all lobes. Respirations equal and unlabored. Abdomen:  +BS, soft, non-tender and non-distended. No rebound or guarding. No HSM or masses noted. Derm: No palmar erythema or jaundice Msk:  Symmetrical without gross deformities. Normal posture. Extremities:  Without edema. Neurologic:  Alert  and  oriented x4 Psych:  Alert and cooperative. Normal mood and affect.  Invalid input(s): 6 MONTHS   ASSESSMENT: Sarah Page is a 81 y.o. female presenting today for loose stools.  Patient reports some fecal urgency and looser stools for the past few months. She denies any noticeable anxiety or stressors, though tells me she has had to change her activities due to a fall and feeling less safe going out in her yard doing yard work, this has taken a toll on her mental health feeling that she cannot do the same activities she once did and enjoyed. She also reports the death of a friend recently. I suspect a lot of her symptoms could be more psychogenically related/IBS. She is without alarm symptoms and loose stools seem to occur only once per day, usually in the mornings. She takes imodium as needed with good relief. I discussed the implications of stress on the GI tract and changes that this can cause, I recommended she keep a food journal to see if there are any correlations with food intake and symptoms, and also provided low FODMAP diet for her to try. She will call me with an update on symptoms in a few weeks. Will hold off on stool studies at this time as stools are mostly just loose and frequency is low, likely not indicating an infectious etiology.    PLAN:   keep a food journal for the next 1-2 weeks to see if there  are any correlations in things you are eating and your symptoms. 2. Can continue imodium use as needed, do not exceed 4 tabs per day 3. Low FODMAP diet 4. Can try probiotic, lactobacillus rhamnosus 5. Patient to call with update on symptoms in the next few weeks 6. Consider stool studies if diarrhea becomes more frequent    Follow Up: 3 months  Charleton Deyoung L. Jeanmarie Hubert, MSN, APRN, AGNP-C Adult-Gerontology Nurse Practitioner [facility]

## 2021-06-13 ENCOUNTER — Telehealth (INDEPENDENT_AMBULATORY_CARE_PROVIDER_SITE_OTHER): Payer: Self-pay | Admitting: *Deleted

## 2021-06-13 ENCOUNTER — Other Ambulatory Visit (INDEPENDENT_AMBULATORY_CARE_PROVIDER_SITE_OTHER): Payer: Self-pay | Admitting: Gastroenterology

## 2021-06-13 ENCOUNTER — Other Ambulatory Visit (INDEPENDENT_AMBULATORY_CARE_PROVIDER_SITE_OTHER): Payer: Self-pay | Admitting: *Deleted

## 2021-06-13 DIAGNOSIS — R195 Other fecal abnormalities: Secondary | ICD-10-CM

## 2021-06-13 DIAGNOSIS — R197 Diarrhea, unspecified: Secondary | ICD-10-CM

## 2021-06-13 NOTE — Telephone Encounter (Signed)
Orders placed up front for pt to pick up. Tried to call pt no answer

## 2021-06-13 NOTE — Telephone Encounter (Signed)
Pt was talking to a friend who had similar symptoms to hers. Weight loss and diarrhea every day. She said he told to check for celiac disease. She would like to do this lab test Thursday at [place] if possible while she is having other labs done.

## 2021-06-14 NOTE — Telephone Encounter (Signed)
Pt notified and will pick up lab order at office today

## 2021-06-15 DIAGNOSIS — K5792 Diverticulitis of intestine, part unspecified, without perforation or abscess without bleeding: Secondary | ICD-10-CM | POA: Diagnosis not present

## 2021-06-15 DIAGNOSIS — R197 Diarrhea, unspecified: Secondary | ICD-10-CM | POA: Diagnosis not present

## 2021-06-15 DIAGNOSIS — K589 Irritable bowel syndrome without diarrhea: Secondary | ICD-10-CM | POA: Diagnosis not present

## 2021-06-15 DIAGNOSIS — D72819 Decreased white blood cell count, unspecified: Secondary | ICD-10-CM | POA: Diagnosis not present

## 2021-06-20 ENCOUNTER — Telehealth (INDEPENDENT_AMBULATORY_CARE_PROVIDER_SITE_OTHER): Payer: Self-pay

## 2021-06-20 DIAGNOSIS — H2513 Age-related nuclear cataract, bilateral: Secondary | ICD-10-CM | POA: Diagnosis not present

## 2021-06-20 DIAGNOSIS — H5203 Hypermetropia, bilateral: Secondary | ICD-10-CM | POA: Diagnosis not present

## 2021-06-20 DIAGNOSIS — Z79899 Other long term (current) drug therapy: Secondary | ICD-10-CM | POA: Diagnosis not present

## 2021-06-20 NOTE — Telephone Encounter (Signed)
I called and left a message on the patients vm to call the office back.  Patient called and left a vm to see if we received her recent lab work and wanted to know what the celiac panel had showed. I spoke with Scherrie Gerlach Np here at [facility] she says she did receive the labs and they were negative for celiac.

## 2021-06-21 NOTE — Telephone Encounter (Signed)
Patient aware of all and states since starting the probiotic culturell she is doing much better and not having morning diarrhea. ?

## 2021-07-06 DIAGNOSIS — Z79899 Other long term (current) drug therapy: Secondary | ICD-10-CM | POA: Diagnosis not present

## 2021-08-01 DIAGNOSIS — N3946 Mixed incontinence: Secondary | ICD-10-CM | POA: Diagnosis not present

## 2021-08-09 DIAGNOSIS — H2511 Age-related nuclear cataract, right eye: Secondary | ICD-10-CM | POA: Diagnosis not present

## 2021-08-09 DIAGNOSIS — H25811 Combined forms of age-related cataract, right eye: Secondary | ICD-10-CM | POA: Diagnosis not present

## 2021-08-12 DIAGNOSIS — J069 Acute upper respiratory infection, unspecified: Secondary | ICD-10-CM | POA: Diagnosis not present

## 2021-08-12 DIAGNOSIS — Z6821 Body mass index (BMI) 21.0-21.9, adult: Secondary | ICD-10-CM | POA: Diagnosis not present

## 2021-08-12 DIAGNOSIS — I10 Essential (primary) hypertension: Secondary | ICD-10-CM | POA: Diagnosis not present

## 2021-08-12 DIAGNOSIS — Z20828 Contact with and (suspected) exposure to other viral communicable diseases: Secondary | ICD-10-CM | POA: Diagnosis not present

## 2021-08-15 DIAGNOSIS — R197 Diarrhea, unspecified: Secondary | ICD-10-CM | POA: Diagnosis not present

## 2021-08-15 DIAGNOSIS — R531 Weakness: Secondary | ICD-10-CM | POA: Diagnosis not present

## 2021-08-15 DIAGNOSIS — M1991 Primary osteoarthritis, unspecified site: Secondary | ICD-10-CM | POA: Diagnosis not present

## 2021-08-15 DIAGNOSIS — M0609 Rheumatoid arthritis without rheumatoid factor, multiple sites: Secondary | ICD-10-CM | POA: Diagnosis not present

## 2021-08-15 DIAGNOSIS — Z681 Body mass index (BMI) 19 or less, adult: Secondary | ICD-10-CM | POA: Diagnosis not present

## 2021-08-15 DIAGNOSIS — Z79899 Other long term (current) drug therapy: Secondary | ICD-10-CM | POA: Diagnosis not present

## 2021-09-05 ENCOUNTER — Encounter (INDEPENDENT_AMBULATORY_CARE_PROVIDER_SITE_OTHER): Payer: Self-pay | Admitting: Gastroenterology

## 2021-09-05 ENCOUNTER — Ambulatory Visit (INDEPENDENT_AMBULATORY_CARE_PROVIDER_SITE_OTHER): Payer: Medicare PPO | Admitting: Gastroenterology

## 2021-09-05 VITALS — BP 156/89 | HR 93 | Temp 98.2°F | Ht 64.0 in | Wt 119.5 lb

## 2021-09-05 DIAGNOSIS — R195 Other fecal abnormalities: Secondary | ICD-10-CM

## 2021-09-05 DIAGNOSIS — R634 Abnormal weight loss: Secondary | ICD-10-CM | POA: Insufficient documentation

## 2021-09-05 NOTE — Patient Instructions (Addendum)
We will check your thyroid function today to make sure this is not the cause of your looser stools.  ?In regards to your weight loss, I would like to keep a close eye on this, you can try doing boost or ensure protein shakes, 2-3 per day between meals to help maintain weight/nutrition ?Make sure that you are getting plenty of fruits, veggies and whole grains in your diet ?You can start doing benefiber for your looser stools, you can do 4g per day ?If you have labs done with your PCP, please have them send those over so we can add them to your chart ? ?Follow up 3 months ?

## 2021-09-05 NOTE — Progress Notes (Signed)
Referring Provider: Garnette Gunner, MD Primary Care Physician:  Garnette Gunner, MD Primary GI Physician: Rehman  Chief Complaint  Patient presents with   Follow-up    Patient here today for a follow up on diarrhea. She states she has tried culterelle and it stopped working.    HPI:   Sarah Page is a 81 y.o. female with past medical history of anemia, arthritis, stroke, visual impairment and diverticulitis (2019).  Patient presenting today for follow up of diarrhea.   Last seen on 06/08/21 for diarrhea, at that time reported loose stools x a few months, also endorsed some fecal incontinence, having 1 BM per day. Denied abdominal pain. Was taking Florajen probiotic at that time and 1 imodium per day. Advised her to keep food journal x1-2 weeks, continue imodium as needed, implement low FODMAP diet and try a different probiotic. Recommend considering stool studies if diarrhea becomes more frequent. Celiac panel ordered was negative.  CBC done in February 2023 with normal WBC, hgb.   Today, she states that she took the culturelle probiotic and it worked for about 5 days, after that she began having looser stools again. Will typically have 1 loose BM in the morning upon getting up, may occasionally have a second BM after dinner in the evenings but this does not occur often. Denies abdominal pain, blood in stools or black stools. She did not keep a food journal as discussed previously. She states that she does not seem to notice a correlation in what she eats and her stools. Is not having any solid stools now. She states that her appetite is okay. She has lost about 4 pounds since her visit in February. Reports that she eats a lot of vegetables mostly. Was previously going to the [facility] and working out on the stationary bike and state that she just has not felt good enough to do this recently. Feels that her RA causes her to have muscle pain and fatigue. Has been trying to work more in her  yard recently.    Last Colonoscopy:08/04/15 diverticula throughout colon and internal hemorrhoids  Recommendations:  Repeat colonoscopy in 2027 if health permits  Past Medical History:  Diagnosis Date   Anemia    Arthritis    Gait disturbance    Rotator cuff tear    DR Magnus Ivan   Stroke [facility])    Vision abnormalities     Past Surgical History:  Procedure Laterality Date   ORIF CLAVICULAR FRACTURE Left     Current Outpatient Medications  Medication Sig Dispense Refill   acetaminophen (TYLENOL) 650 MG CR tablet Take 650 mg by mouth every 8 (eight) hours as needed for pain.     amLODipine-olmesartan (AZOR) 5-20 MG tablet Take by mouth.     clopidogrel (PLAVIX) 75 MG tablet Take 75 mg by mouth daily.      Cyanocobalamin (VITAMIN B 12 PO) Take 1,000 mg by mouth daily.     ezetimibe (ZETIA) 10 MG tablet 1 tablet     hydroxychloroquine (PLAQUENIL) 200 MG tablet Take 200 mg by mouth daily.     leflunomide (ARAVA) 20 MG tablet Take 10 mg by mouth daily.     Melatonin 10 MG TABS Take 10 mg by mouth at bedtime.     sertraline (ZOLOFT) 25 MG tablet Take 25 mg by mouth daily.     solifenacin (VESICARE) 5 MG tablet Take 5 mg by mouth daily.     Vibegron (GEMTESA) 75 MG TABS daily  at 6 (six) AM.     No current facility-administered medications for this visit.    Allergies as of 09/05/2021   (No Known Allergies)    Family History  Problem Relation Age of Onset   Stroke Mother    Transient ischemic attack Mother    Stroke Father    Heart attack Father    Mental illness Brother    Breast cancer Daughter [phone]       [name]    Social History   Socioeconomic History   Marital status: Widowed    Spouse name: Not on file   Number of children: Not on file   Years of education: Not on file   Highest education level: Not on file  Occupational History   Not on file  Tobacco Use   Smoking status: Never   Smokeless tobacco: Never  Vaping Use   Vaping Use: Never used  Substance and  Sexual Activity   Alcohol use: Yes    Comment: occasional wine   Drug use: Never   Sexual activity: Not on file  Other Topics Concern   Not on file  Social History Narrative   Not on file   Social Determinants of Health   Financial Resource Strain: Not on file  Food Insecurity: Not on file  Transportation Needs: Not on file  Physical Activity: Not on file  Stress: Not on file  Social Connections: Not on file   Review of systems General: negative for malaise, night sweats, fever, chills, +weight loss Neck: Negative for lumps, goiter, pain and significant neck swelling Resp: Negative for cough, wheezing, dyspnea at rest CV: Negative for chest pain, leg swelling, palpitations, orthopnea GI: denies melena, hematochezia, nausea, vomiting,constipation, dysphagia, odyonophagia, early satiety or unintentional weight loss. +loose stools MSK: Negative for joint pain back pain. +joint swelling +muscle pain Derm: Negative for itching or rash Psych: Denies depression, anxiety, memory loss, confusion. No homicidal or suicidal ideation.  Heme: Negative for prolonged bleeding, bruising easily, and swollen nodes. Endocrine: Negative for cold or heat intolerance, polyuria, polydipsia and goiter. Neuro: negative for tremor, gait imbalance, syncope and seizures. The remainder of the review of systems is noncontributory.  Physical Exam: BP (!) 156/89 (BP Location: Left Arm, Patient Position: Sitting, Cuff Size: Small)   Pulse 93   Temp 98.2 F (36.8 C) (Oral)   Ht  (1.626 m)   Wt 119 lb 8 oz (54.2 kg)   BMI 20.51 kg/m  General:   Alert and oriented. No distress noted. Pleasant and cooperative.  Head:  Normocephalic and atraumatic. Eyes:  Conjuctiva clear without scleral icterus. Mouth:  Oral mucosa pink and moist. Good dentition. No lesions. Heart: Normal rate and rhythm, s1 and s2 heart sounds present.  Lungs: Clear lung sounds in all lobes. Respirations equal and unlabored. Abdomen:   +BS, soft, non-tender and non-distended. No rebound or guarding. No HSM or masses noted. Derm: No palmar erythema or jaundice Msk:  Symmetrical without gross deformities. Normal posture. Extremities:  Without edema. Neurologic:  Alert and  oriented x4 Psych:  Alert and cooperative. Normal mood and affect.  Invalid input(s): 6 MONTHS   ASSESSMENT: Sarah Page is a 81 y.o. female presenting today for follow up of loose stools   Continues to have 1, sometimes 2 loose stools per day. No associated symptoms. Denies rectal bleeding, melena, abdominal pain, nausea or vomiting. Notably,  weight has slowly declined over the past few years, with approx 11 lb weight loss since feb  2022, though she states that appetite has decreased and she mostly eats high veggie diet. she feels that her RA keeps her from doing a lot of things and has caused her to have less energy. Notably, she is on PeruGemtesa and Arava, both of which can cause diarrhea with Ranae Plumberrava also known for anorexia. Recommended she start ensure/boost shakes 2-3 per day to help maintain weight. Will check TSH to rule out hyperthyroidism as contributor to loose stools and weight loss. Can start daily benefiber to help thicken stools. Will continue to follow weight, though I suspect gradual decline is related mostly in part to her RA/medications. She has no red flag symptoms at this time, however, may need to consider further evaluation if weight loss continues.   PLAN:  Check TSH 2.  Benefiber 4g daily 3. High fiber diet 4. Ensure/boost shake 2-3 x/day 5. Have PCP send over upcoming labs once completed  All questions were answered, patient verbalized understanding and is in agreement with plan as outlined above.   Follow Up: 3 months  Kamaiya Antilla L. Jeanmarie Hubertarlan, MSN, APRN, AGNP-C Adult-Gerontology Nurse Practitioner [facility]

## 2021-09-07 DIAGNOSIS — M810 Age-related osteoporosis without current pathological fracture: Secondary | ICD-10-CM | POA: Diagnosis not present

## 2021-09-08 DIAGNOSIS — E782 Mixed hyperlipidemia: Secondary | ICD-10-CM | POA: Diagnosis not present

## 2021-09-08 DIAGNOSIS — E559 Vitamin D deficiency, unspecified: Secondary | ICD-10-CM | POA: Diagnosis not present

## 2021-09-08 DIAGNOSIS — M069 Rheumatoid arthritis, unspecified: Secondary | ICD-10-CM | POA: Diagnosis not present

## 2021-09-08 DIAGNOSIS — K750 Abscess of liver: Secondary | ICD-10-CM | POA: Diagnosis not present

## 2021-09-08 DIAGNOSIS — E039 Hypothyroidism, unspecified: Secondary | ICD-10-CM | POA: Diagnosis not present

## 2021-09-08 DIAGNOSIS — I10 Essential (primary) hypertension: Secondary | ICD-10-CM | POA: Diagnosis not present

## 2021-09-08 DIAGNOSIS — R748 Abnormal levels of other serum enzymes: Secondary | ICD-10-CM | POA: Diagnosis not present

## 2021-09-08 DIAGNOSIS — R739 Hyperglycemia, unspecified: Secondary | ICD-10-CM | POA: Diagnosis not present

## 2021-09-08 DIAGNOSIS — E7849 Other hyperlipidemia: Secondary | ICD-10-CM | POA: Diagnosis not present

## 2021-09-12 DIAGNOSIS — E7849 Other hyperlipidemia: Secondary | ICD-10-CM | POA: Diagnosis not present

## 2021-09-12 DIAGNOSIS — M159 Polyosteoarthritis, unspecified: Secondary | ICD-10-CM | POA: Diagnosis not present

## 2021-09-12 DIAGNOSIS — R634 Abnormal weight loss: Secondary | ICD-10-CM | POA: Diagnosis not present

## 2021-09-12 DIAGNOSIS — F324 Major depressive disorder, single episode, in partial remission: Secondary | ICD-10-CM | POA: Diagnosis not present

## 2021-09-12 DIAGNOSIS — N3946 Mixed incontinence: Secondary | ICD-10-CM | POA: Diagnosis not present

## 2021-09-12 DIAGNOSIS — R7303 Prediabetes: Secondary | ICD-10-CM | POA: Diagnosis not present

## 2021-09-12 DIAGNOSIS — M069 Rheumatoid arthritis, unspecified: Secondary | ICD-10-CM | POA: Diagnosis not present

## 2021-09-12 DIAGNOSIS — I10 Essential (primary) hypertension: Secondary | ICD-10-CM | POA: Diagnosis not present

## 2021-09-19 ENCOUNTER — Ambulatory Visit (INDEPENDENT_AMBULATORY_CARE_PROVIDER_SITE_OTHER): Payer: Medicare PPO | Admitting: Gastroenterology

## 2021-09-19 ENCOUNTER — Encounter (INDEPENDENT_AMBULATORY_CARE_PROVIDER_SITE_OTHER): Payer: Self-pay | Admitting: Gastroenterology

## 2021-09-19 VITALS — BP 157/94 | HR 104 | Temp 98.0°F | Ht 64.0 in | Wt 119.9 lb

## 2021-09-19 DIAGNOSIS — R195 Other fecal abnormalities: Secondary | ICD-10-CM | POA: Diagnosis not present

## 2021-09-19 NOTE — Progress Notes (Signed)
Referring Provider: No ref. provider found Primary Care Physician:  No primary care provider on file. Primary GI Physician:   Chief Complaint  Patient presents with   Diarrhea    Patient here today due to diarrhea for the last six months. She says her family member had told her about the FODMAP diet and wanted to hear your opinion on this.    HPI:   Sarah Page is a 81 y.o. female with past medical history of anemia, arthritis, stroke, visual impairment and diverticulitis (2019).  Patient presenting today for discussion of low FODMAP diet  Patient last seen on 5/16, at that time, we discussed checking TSH, starting benefiber, doing ensure/boost 2-3x/day. low fodmap diet and food journal were discused with patient in February, however, she did not implement these. At last visit she reported one looser stool per day, usually in the morning. Denied fecal incontinence, rectal bleeding or melena.   Patient arrives with her daughter today, she states that she spoke with her sister in law who is a Teacher, early years/pre and that she recommended the low FODMAP diet. She states that she does not recall discussing this at her visit in February and wanted to see if I thought this would be something she should implement. She also did not start benefiber as discussed at last visit, however, has been keeping food journal. She tells me she has not seen much correlation in foods she is eating and symptoms, though she did eat some chili beans recently which gave her looser stools and abdominal discomfort. Reports she is having 1-3 BMs per day now. She reports that she is getting up and having her coffee in the morning and having a looser or watery stool. She states that she did have TSH drawn but with her PCP, not through the requisition that was ordered here, however, was told that it was normal. Denies rectal bleeding or melena. Is doing ensure/boost 2 shakes per day.   Last Colonoscopy:08/04/15 diverticula  throughout colon and internal hemorrhoids Last Endoscopy:  Recommendations:   Past Medical History:  Diagnosis Date   Anemia    Arthritis    Gait disturbance    Rotator cuff tear    DR Magnus Ivan   Stroke [facility])    Vision abnormalities     Past Surgical History:  Procedure Laterality Date   ORIF CLAVICULAR FRACTURE Left     Current Outpatient Medications  Medication Sig Dispense Refill   acetaminophen (TYLENOL) 650 MG CR tablet Take 650 mg by mouth every 8 (eight) hours as needed for pain.     amLODipine-olmesartan (AZOR) 5-20 MG tablet Take by mouth.     clopidogrel (PLAVIX) 75 MG tablet Take 75 mg by mouth daily.      Cyanocobalamin (VITAMIN B 12 PO) Take 1,000 mg by mouth daily.     ezetimibe (ZETIA) 10 MG tablet 1 tablet     hydroxychloroquine (PLAQUENIL) 200 MG tablet Take 200 mg by mouth daily.     leflunomide (ARAVA) 20 MG tablet Take 10 mg by mouth daily.     Melatonin 10 MG TABS Take 10 mg by mouth at bedtime.     sertraline (ZOLOFT) 25 MG tablet Take 25 mg by mouth daily.     solifenacin (VESICARE) 5 MG tablet Take 5 mg by mouth daily.     Vibegron (GEMTESA) 75 MG TABS daily at 6 (six) AM.     No current facility-administered medications for this visit.    Allergies as of  09/19/2021   (No Known Allergies)    Family History  Problem Relation Age of Onset   Stroke Mother    Transient ischemic attack Mother    Stroke Father    Heart attack Father    Mental illness Brother    Breast cancer Daughter 61       [name]    Social History   Socioeconomic History   Marital status: Widowed    Spouse name: Not on file   Number of children: Not on file   Years of education: Not on file   Highest education level: Not on file  Occupational History   Not on file  Tobacco Use   Smoking status: Never   Smokeless tobacco: Never  Vaping Use   Vaping Use: Never used  Substance and Sexual Activity   Alcohol use: Yes    Comment: occasional wine   Drug use: Never    Sexual activity: Not on file  Other Topics Concern   Not on file  Social History Narrative   Not on file   Social Determinants of Health   Financial Resource Strain: Not on file  Food Insecurity: Not on file  Transportation Needs: Not on file  Physical Activity: Not on file  Stress: Not on file  Social Connections: Not on file   Review of systems General: negative for malaise, night sweats, fever, chills, weight loss Neck: Negative for lumps, goiter, pain and significant neck swelling Resp: Negative for cough, wheezing, dyspnea at rest CV: Negative for chest pain, leg swelling, palpitations, orthopnea GI: denies melena, hematochezia, nausea, vomiting, constipation, dysphagia, odyonophagia, early satiety or unintentional weight loss. +loose stools MSK: Negative for joint pain or swelling, back pain, and muscle pain. Derm: Negative for itching or rash Psych: Denies depression, anxiety, memory loss, confusion. No homicidal or suicidal ideation.  Heme: Negative for prolonged bleeding, bruising easily, and swollen nodes. Endocrine: Negative for cold or heat intolerance, polyuria, polydipsia and goiter. Neuro: negative for tremor, gait imbalance, syncope and seizures. The remainder of the review of systems is noncontributory.  Physical Exam: BP (!) 157/94 (BP Location: Left Arm, Patient Position: Sitting, Cuff Size: Small)   Pulse (!) 104   Temp 98 F (36.7 C) (Oral)   Ht  (1.626 m)   Wt 119 lb 14.4 oz (54.4 kg)   BMI 20.58 kg/m  General:   Alert and oriented. No distress noted. Pleasant and cooperative.  Head:  Normocephalic and atraumatic. Eyes:  Conjuctiva clear without scleral icterus. Mouth:  Oral mucosa pink and moist. Good dentition. No lesions. Heart: Normal rate and rhythm, s1 and s2 heart sounds present.  Lungs: Clear lung sounds in all lobes. Respirations equal and unlabored. Abdomen:  +BS, soft, non-tender and non-distended. No rebound or guarding. No HSM or  masses noted. Derm: No palmar erythema or jaundice Msk:  Symmetrical without gross deformities. Normal posture. Extremities:  Without edema. Neurologic:  Alert and  oriented x4 Psych:  Alert and cooperative. Normal mood and affect.  Invalid input(s): 6 MONTHS   ASSESSMENT: Sarah Page is a 81 y.o. female presenting today to discuss low FODMAP diet.   Patient with 1-3 BMs per day, looser to watery. TSH ordered at last visit on 5/16 was completed at PCP, will request these labs be sent over, though they were normal per patient. She inquired about low FODMAP diet, however, this was discussed at her visit in February and again on 5/16 as well as food journal, however, patient states  she does not recall this.  Patient has not started daily benefiber as recommended at last visit. I recommended she continue with food journal, implement low FODMAP diet and start daily benefiber. Implementation of low FODMAP diet and importance of food journal was discussed in depth with the patient and her daughter who verbalized understanding. I advised her she can call if she has any changes in symptoms or questions.     PLAN:  Low FODMAP diet  2. Obtain TSH results from PCP 3. Benefiber 4g daily  4. Continue boost/ensure BID 5. Continue food journal  All questions were answered, patient verbalized understanding and is in agreement with plan as outlined above.    Follow Up: Has follow up in august   Jannatul Wojdyla L. Jeanmarie Hubert, MSN, APRN, AGNP-C Adult-Gerontology Nurse Practitioner [facility]

## 2021-09-19 NOTE — Patient Instructions (Addendum)
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 Please continue to keep a food journal for the next few weeks with foods you are eating and correlating GI symptoms   I have provided the low FODMAP diet for you to reference, as we discussed, this can be helpful in helping to avoid common triggers of diarrhea/abdominal discomfort, I recommend cutting out a few of these at a time and keeping track of symptoms to see how this correlates.  Please start daily benefiber with probiotic as we discussed at last visit.  I will see you at follow up in August, please call me if you have any questions or concerns in the meantime

## 2021-09-26 ENCOUNTER — Telehealth (INDEPENDENT_AMBULATORY_CARE_PROVIDER_SITE_OTHER): Payer: Self-pay

## 2021-09-26 NOTE — Telephone Encounter (Signed)
I called and left a message for the patient on her vm, I asked that she please return call.   Patient called and left a message on voice mail that she had been trying the FOD MAP diet for the last 5-6 days and has had a few problems with loose stools, but not every day like usual wanted to know if there was something better than imodium she could use.

## 2021-09-29 NOTE — Telephone Encounter (Signed)
I called and left a message asked that if she still needs assistance to please call the office.

## 2021-09-29 NOTE — Telephone Encounter (Signed)
Patient returned call she says she has been on the [employer]od Map diet for going on for two weeks.   # 1 She ate some tater tots with dehydrated onion in it last night and it has caused some diarrhea this am.   #2 She wants to know what you think of prescribing an antibiotic for Hidden germs? (Xifaxan???)  #3 Can you prescribe a antidiarrheal that is not imodium?  #4 How long before she needs to start adding foods back in, as mentioned she has been on the diet for the last two weeks. Please advise.

## 2021-10-03 NOTE — Telephone Encounter (Signed)
I called and left a message on vm asked that patient please return call.

## 2021-10-03 NOTE — Telephone Encounter (Signed)
Patient called back regarding following up on the message she left last week.

## 2021-10-04 NOTE — Telephone Encounter (Signed)
I called and left a message asked that patient please return call.  ?

## 2021-10-05 NOTE — Telephone Encounter (Signed)
Tried calling the patient on home number = busy. Spoke with patient daughter and she is aware of all she will forward the message to her mother. I advised we would try her mom again on the home number.

## 2021-10-06 NOTE — Telephone Encounter (Signed)
Patient states she has no more than one Bm per and and the last few days she has not had any diarrhea. I did inform her to add the foods back into her diet that did not cause her symptoms and avoid the ones that did cause symptoms. She states understanding and has no more issues.

## 2021-10-23 ENCOUNTER — Telehealth (INDEPENDENT_AMBULATORY_CARE_PROVIDER_SITE_OTHER): Payer: Self-pay

## 2021-10-23 NOTE — Telephone Encounter (Signed)
Patient called today stating she is having issues with stomach discomfort after eating apple sauce and garlic. She wants to know what you can send in other than Imodium.She says she is having at least three bm' s that she states is diarrhea per day. Denies any abdominal pain, or fevers or any other issues. If anything is sent in for her she would like it to go to [facility].

## 2021-10-23 NOTE — Telephone Encounter (Signed)
Patient aware of all.

## 2021-10-31 ENCOUNTER — Telehealth (INDEPENDENT_AMBULATORY_CARE_PROVIDER_SITE_OTHER): Payer: Self-pay | Admitting: *Deleted

## 2021-10-31 NOTE — Telephone Encounter (Signed)
Patient seen on 09/19/21 and has upcoming appt on 12/11/21. Her weight at ov on 09/19/21 was 119 lbs. She states she has been following fodmap diet for 6-7 weeks and no improvement. Keeping food journal and avoiding foods that make diarrhea worse. Taking benfiber 3 capsules per day but she is unsure of how many grams that is. Please see note below.

## 2021-10-31 NOTE — Telephone Encounter (Signed)
She has one loose stool every morning and not all days but some days she will have a couple more loose stools during the day

## 2021-10-31 NOTE — Telephone Encounter (Signed)
Has tried imodium as needed not daily. Has loose stool daily usually after breaksfast and sometimes a couple more stools through out the day. Tries to do the meal replacement shakes at least one daily. Last weight she checked was 114. States it was about 140 before this started around January or February.

## 2021-10-31 NOTE — Telephone Encounter (Signed)
Pt left voicemail that she was still having loose stools and diarrhea. Losing weight. Food goes straight through. She states she is getting frustrated and there has to be something that can help her.   [phone]  I called patient back to get more information and number was busy times x 4. Will try again later today.

## 2021-11-01 ENCOUNTER — Other Ambulatory Visit: Payer: Self-pay | Admitting: *Deleted

## 2021-11-01 DIAGNOSIS — R197 Diarrhea, unspecified: Secondary | ICD-10-CM

## 2021-11-01 NOTE — Telephone Encounter (Signed)
Patient notified and order faxed to [facility].

## 2021-11-01 NOTE — Telephone Encounter (Signed)
Left message to return call 

## 2021-11-07 LAB — C. DIFFICILE GDH AND TOXIN A/B
GDH ANTIGEN: NOT DETECTED
MICRO NUMBER:: 13646113
SPECIMEN QUALITY:: ADEQUATE
TOXIN A AND B: NOT DETECTED

## 2021-11-07 LAB — GASTROINTESTINAL PATHOGEN PNL
CampyloBacter Group: NOT DETECTED
Norovirus GI/GII: NOT DETECTED
Rotavirus A: NOT DETECTED
Salmonella species: NOT DETECTED
Shiga Toxin 1: NOT DETECTED
Shiga Toxin 2: NOT DETECTED
Shigella Species: NOT DETECTED
Vibrio Group: NOT DETECTED
Yersinia enterocolitica: NOT DETECTED

## 2021-11-07 LAB — OVA AND PARASITE EXAMINATION
CONCENTRATE RESULT:: NONE SEEN
MICRO NUMBER:: 13644021
SPECIMEN QUALITY:: ADEQUATE
TRICHROME RESULT:: NONE SEEN

## 2021-11-08 ENCOUNTER — Telehealth: Payer: Self-pay | Admitting: *Deleted

## 2021-11-09 ENCOUNTER — Encounter (INDEPENDENT_AMBULATORY_CARE_PROVIDER_SITE_OTHER): Payer: Self-pay

## 2021-11-09 ENCOUNTER — Telehealth (INDEPENDENT_AMBULATORY_CARE_PROVIDER_SITE_OTHER): Payer: Self-pay

## 2021-11-09 ENCOUNTER — Other Ambulatory Visit (INDEPENDENT_AMBULATORY_CARE_PROVIDER_SITE_OTHER): Payer: Self-pay

## 2021-11-09 DIAGNOSIS — R195 Other fecal abnormalities: Secondary | ICD-10-CM

## 2021-11-09 MED ORDER — PEG 3350-KCL-NA BICARB-NACL 420 G PO SOLR: 4000.0000 mL | ORAL | 0 refills | Status: DC

## 2021-11-09 NOTE — Telephone Encounter (Signed)
Sarah Page [name], CMA  ?

## 2021-11-10 DIAGNOSIS — D519 Vitamin B12 deficiency anemia, unspecified: Secondary | ICD-10-CM | POA: Diagnosis not present

## 2021-11-10 DIAGNOSIS — R739 Hyperglycemia, unspecified: Secondary | ICD-10-CM | POA: Diagnosis not present

## 2021-11-10 DIAGNOSIS — E559 Vitamin D deficiency, unspecified: Secondary | ICD-10-CM | POA: Diagnosis not present

## 2021-11-10 DIAGNOSIS — K750 Abscess of liver: Secondary | ICD-10-CM | POA: Diagnosis not present

## 2021-11-10 NOTE — Telephone Encounter (Signed)
error 

## 2021-11-13 ENCOUNTER — Other Ambulatory Visit: Payer: Self-pay | Admitting: Physician Assistant

## 2021-11-13 ENCOUNTER — Telehealth (INDEPENDENT_AMBULATORY_CARE_PROVIDER_SITE_OTHER): Payer: Self-pay

## 2021-11-13 ENCOUNTER — Other Ambulatory Visit: Payer: Self-pay | Admitting: Family Medicine

## 2021-11-13 DIAGNOSIS — Z1231 Encounter for screening mammogram for malignant neoplasm of breast: Secondary | ICD-10-CM

## 2021-11-13 NOTE — Telephone Encounter (Signed)
Per Dr Junie Bame Tweedy [birth year] may hold her Plavix for 5 days prior to procedure patient is aware

## 2021-11-13 NOTE — Telephone Encounter (Signed)
Thanks

## 2021-11-13 NOTE — Telephone Encounter (Signed)
Patient aware of all.

## 2021-11-13 NOTE — Telephone Encounter (Signed)
I tried calling no answer. I left a message on patient vm asked if she still needs assistance to please return call.   Diarrhea x 3 today. She says imodium not helping. She is going to the beach on Friday and needs assistance.

## 2021-11-13 NOTE — Telephone Encounter (Signed)
Patient states she had a boost this am and a non dairy yogurt and it caused her to have three episodes of diarrhea this am. She says the imodium has not helped she feels as if she could go again anytime. She denies any nausea, fevers or abdominal pain. Patient would like something sent to [facility]. Please advise

## 2021-11-28 ENCOUNTER — Encounter (INDEPENDENT_AMBULATORY_CARE_PROVIDER_SITE_OTHER): Payer: Self-pay | Admitting: Gastroenterology

## 2021-12-05 ENCOUNTER — Ambulatory Visit
Admission: RE | Admit: 2021-12-05 | Discharge: 2021-12-05 | Disposition: A | Discharge status: Home / Self Care | Payer: Medicare PPO | Source: Ambulatory Visit | Attending: Family Medicine | Admitting: Family Medicine

## 2021-12-05 DIAGNOSIS — Z1231 Encounter for screening mammogram for malignant neoplasm of breast: Secondary | ICD-10-CM

## 2021-12-08 DIAGNOSIS — D519 Vitamin B12 deficiency anemia, unspecified: Secondary | ICD-10-CM | POA: Diagnosis not present

## 2021-12-08 DIAGNOSIS — E039 Hypothyroidism, unspecified: Secondary | ICD-10-CM | POA: Diagnosis not present

## 2021-12-08 DIAGNOSIS — D509 Iron deficiency anemia, unspecified: Secondary | ICD-10-CM | POA: Diagnosis not present

## 2021-12-08 DIAGNOSIS — R748 Abnormal levels of other serum enzymes: Secondary | ICD-10-CM | POA: Diagnosis not present

## 2021-12-08 DIAGNOSIS — R739 Hyperglycemia, unspecified: Secondary | ICD-10-CM | POA: Diagnosis not present

## 2021-12-08 DIAGNOSIS — I10 Essential (primary) hypertension: Secondary | ICD-10-CM | POA: Diagnosis not present

## 2021-12-08 DIAGNOSIS — R5383 Other fatigue: Secondary | ICD-10-CM | POA: Diagnosis not present

## 2021-12-08 DIAGNOSIS — E7849 Other hyperlipidemia: Secondary | ICD-10-CM | POA: Diagnosis not present

## 2021-12-08 DIAGNOSIS — E559 Vitamin D deficiency, unspecified: Secondary | ICD-10-CM | POA: Diagnosis not present

## 2021-12-11 ENCOUNTER — Ambulatory Visit (INDEPENDENT_AMBULATORY_CARE_PROVIDER_SITE_OTHER): Payer: Medicare PPO | Admitting: Gastroenterology

## 2021-12-12 DIAGNOSIS — R7303 Prediabetes: Secondary | ICD-10-CM | POA: Diagnosis not present

## 2021-12-12 DIAGNOSIS — E7849 Other hyperlipidemia: Secondary | ICD-10-CM | POA: Diagnosis not present

## 2021-12-12 DIAGNOSIS — I10 Essential (primary) hypertension: Secondary | ICD-10-CM | POA: Diagnosis not present

## 2021-12-12 DIAGNOSIS — Z6820 Body mass index (BMI) 20.0-20.9, adult: Secondary | ICD-10-CM | POA: Diagnosis not present

## 2021-12-12 DIAGNOSIS — R634 Abnormal weight loss: Secondary | ICD-10-CM | POA: Diagnosis not present

## 2021-12-12 DIAGNOSIS — M419 Scoliosis, unspecified: Secondary | ICD-10-CM | POA: Diagnosis not present

## 2021-12-12 DIAGNOSIS — M159 Polyosteoarthritis, unspecified: Secondary | ICD-10-CM | POA: Diagnosis not present

## 2021-12-12 DIAGNOSIS — M069 Rheumatoid arthritis, unspecified: Secondary | ICD-10-CM | POA: Diagnosis not present

## 2021-12-15 ENCOUNTER — Encounter (INDEPENDENT_AMBULATORY_CARE_PROVIDER_SITE_OTHER): Payer: Self-pay | Admitting: Gastroenterology

## 2021-12-15 ENCOUNTER — Ambulatory Visit (INDEPENDENT_AMBULATORY_CARE_PROVIDER_SITE_OTHER): Payer: Medicare PPO | Admitting: Gastroenterology

## 2021-12-15 VITALS — BP 128/83 | HR 102 | Temp 98.2°F | Ht 64.0 in | Wt 107.5 lb

## 2021-12-15 DIAGNOSIS — K580 Irritable bowel syndrome with diarrhea: Secondary | ICD-10-CM

## 2021-12-15 DIAGNOSIS — R634 Abnormal weight loss: Secondary | ICD-10-CM

## 2021-12-15 NOTE — Progress Notes (Signed)
 Referring Provider: Thompson, Aaron B, MD Primary Care Physician:  Skillman, Katherine E, PA-C Primary GI Physician: Castaneda   Chief Complaint  Patient presents with   Diarrhea    3 month follow up on diarrhea. States diarrhea is better since following low fodmap diet. Concerned about weight loss. Last weight in May 119 and today 107 lbs.    HPI:   Sarah Page is a 80 y.o. female with past medical history of anemia, arthritis, stroke, visual impairment and diverticulitis (2019).  Patient presenting today for follow up of loose stools/suspected IBS   Patient last seen 09/19/21, at that time she reports she had spoken with her sister in law who is a pharmacist and that she recommended the low FODMAP diet. She states that she does not recall discussing this at her visit here  in February and wanted to see if I thought this would be something she should implement. She also did not start benefiber as discussed at last visit, however, has been keeping food journal. Had not seen much correlation in foods she is eating and symptoms, though she did eat some chili beans recently which gave her looser stools and abdominal discomfort. Reports she is having 1-3 BMs per day now.  She states that she did have TSH drawn but with her PCP, not through the requisition that was ordered here, however, was told that it was normal.   Patient called back in June with continued loose stools, stool studies checked then were negative. She reported eating FODMAP foods and having more looser stools, was again advised to continue with low FODMAP diet. Patient called back in July with continued loose stools when not doing low FODMAP diet. Scheduled for colonoscopy at that time.   Patient previously seen 5/16 with one looser stool per day, at that time, we discussed checking TSH, starting benefiber, doing ensure/boost 2-3x/day. low fodmap diet and food journal were discused with patient in February, however, she did  not implement these.   Present:  Patient states that her daughter had read that [facility] that coconut could help with IBS so she tried it and has had good results. Stools are almost back to normal eating coconut and avoiding triggers. Typically having one BM per day that are more formed. Has occasional abdominal cramping. Denies rectal bleeding or melena. Still doing low FODMAP diet for the most part and has recognized a lot of foods that she cannot tolerate such as garlic. She has cut out diet pepsi, applesauce and cheese as these tend to cause her more issues. She notes feeling more fatigued recently and not able to do as much activity as she previously did. She is down to 107 pounds today from 119 on 5/20.   Reports she had recent labs with PCP this week and was told everything was normal. Per chart review labs done in May with PCP hgb 12.7, normal renal function. TSH 3.2, Vit D 26. AST 42 and Alk Phos 151. Previous celiac testing negative.    Last Colonoscopy:08/04/15 diverticula throughout colon and internal hemorrhoids Last Endoscopy:never  Recommendations:    Past Medical History:  Diagnosis Date   Anemia    Arthritis    Gait disturbance    Rotator cuff tear    DR BLACKMAN   Stroke ([facility])    Vision abnormalities     Past Surgical History:  Procedure Laterality Date   ORIF CLAVICULAR FRACTURE Left     Current Outpatient Medications  Medication Sig Dispense   Refill   acetaminophen (TYLENOL) 650 MG CR tablet Take 650 mg by mouth every 8 (eight) hours as needed for pain.     amLODipine-olmesartan (AZOR) 5-20 MG tablet Take 1 tablet by mouth daily.     calcium carbonate (OSCAL) 1500 (600 Ca) MG TABS tablet Take 600 mg of elemental calcium by mouth daily with breakfast.     clopidogrel (PLAVIX) 75 MG tablet Take 75 mg by mouth daily.      Cyanocobalamin (VITAMIN B 12 PO) Take 1,000 mg by mouth daily.     ezetimibe (ZETIA) 10 MG tablet Take 10 mg by mouth daily.      hydroxychloroquine (PLAQUENIL) 200 MG tablet Take 200 mg by mouth daily.     leflunomide (ARAVA) 20 MG tablet Take 10 mg by mouth daily.     Melatonin 10 MG TABS Take 10 mg by mouth at bedtime.     OVER THE COUNTER MEDICATION Vit D daily 2,000 IU daily     Probiotic Product (ALIGN PO) Take by mouth. Align probiotic one daily     sertraline (ZOLOFT) 25 MG tablet Take 25 mg by mouth daily.     solifenacin (VESICARE) 5 MG tablet Take 5 mg by mouth daily.     Vibegron (GEMTESA) 75 MG TABS daily at 6 (six) AM.     Wheat Dextrin (BENEFIBER PO) Take by mouth. 2 gummies daily     polyethylene glycol-electrolytes (TRILYTE) 420 g solution Take 4,000 mLs by mouth as directed. (Patient not taking: Reported on 12/15/2021) 4000 mL 0   No current facility-administered medications for this visit.    Allergies as of 12/15/2021   (No Known Allergies)    Family History  Problem Relation Age of Onset   Stroke Mother    Transient ischemic attack Mother    Stroke Father    Heart attack Father    Mental illness Brother    Breast cancer Daughter 80       [name]    Social History   Socioeconomic History   Marital status: Widowed    Spouse name: Not on file   Number of children: Not on file   Years of education: Not on file   Highest education level: Not on file  Occupational History   Not on file  Tobacco Use   Smoking status: Never    Passive exposure: Never   Smokeless tobacco: Never  Vaping Use   Vaping Use: Never used  Substance and Sexual Activity   Alcohol use: Yes    Comment: occasional wine   Drug use: Never   Sexual activity: Not on file  Other Topics Concern   Not on file  Social History Narrative   Not on file   Social Determinants of Health   Financial Resource Strain: Not on file  Food Insecurity: Not on file  Transportation Needs: Not on file  Physical Activity: Not on file  Stress: Not on file  Social Connections: Not on file    Review of systems General:  negative for night sweats, fever, chills +weight loss +fatigue  Neck: Negative for lumps, goiter, pain and significant neck swelling Resp: Negative for cough, wheezing, dyspnea at rest CV: Negative for chest pain, leg swelling, palpitations, orthopnea GI: denies melena, hematochezia, nausea, vomiting, diarrhea, constipation, dysphagia, odyonophagia, early satiety +weight loss  MSK: Negative for joint pain or swelling, back pain, and muscle pain. Derm: Negative for itching or rash Psych: Denies depression, anxiety, memory loss, confusion. No homicidal or suicidal  ideation.  Heme: Negative for prolonged bleeding, bruising easily, and swollen nodes. Endocrine: Negative for cold or heat intolerance, polyuria, polydipsia and goiter. Neuro: negative for tremor, gait imbalance, syncope and seizures. The remainder of the review of systems is noncontributory.  Physical Exam: BP 128/83 (BP Location: Left Arm, Patient Position: Sitting, Cuff Size: Normal)   Pulse (!) 102   Temp 98.2 F (36.8 C) (Oral)   Ht _0  (1.626 m)   Wt 107 lb 8 oz (48.8 kg)   BMI 18.45 kg/m  General:   Alert and oriented. No distress noted. Pleasant and cooperative.  Head:  Normocephalic and atraumatic. Eyes:  Conjuctiva clear without scleral icterus. Mouth:  Oral mucosa pink and moist. Good dentition. No lesions. Heart: Normal rate and rhythm, s1 and s2 heart sounds present.  Lungs: Clear lung sounds in all lobes. Respirations equal and unlabored. Abdomen:  +BS, soft, non-tender and non-distended. No rebound or guarding. No HSM or masses noted. Derm: No palmar erythema or jaundice Msk:  Symmetrical without gross deformities. Normal posture. Extremities:  Without edema. Neurologic:  Alert and  oriented x4 Psych:  Alert and cooperative. Normal mood and affect.  Invalid input(s): "6 MONTHS"   ASSESSMENT: Patirica Longshore is a 81 y.o. female presenting today for follow up of looser stools/suspected IBS.  Patient  doing better with more formed stools once daily since adhering to low FODMAP diet and using coconut. Having occasional abdominal cramping. She continues to lose weight and is down to 107 lbs today from 119 lbs in May. She denies rectal bleeding or melena but has fatigue. Recent labs done with PCP earlier this week were normal per patient. Notably has low levels of Vitamin D in her chart from May, on 2000 IU daily. TSH at that time was 3.2 She has colonoscopy scheduled for 9/5 for further evaluation, I encouraged her to continue with low FODMAP diet, avoiding known triggers and can use coconut PRN with good results. Ideally she should try to have 2 protein shakes per day to help with weight loss. Further recommendations regarding this after colonoscopy on 9/5    PLAN:  Continue low FODMAP diet  2. Continue coconut PRN with good result 3. Keep TCS appt 4. Boost/ensure shakes 2x/day for added nutrition 5. Further recommendations to follow after colonoscopy   All questions were answered, patient verbalized understanding and is in agreement with plan as outlined above.    Follow Up: TBD after colonoscopy   Sarah Page. Alver Sorrow, MSN, APRN, AGNP-C Adult-Gerontology Nurse Practitioner [facility]

## 2021-12-15 NOTE — H&P (View-Only) (Signed)
Referring Provider: Bonnita Hollow, MD Primary Care Physician:  Rosalee Kaufman, PA-C Primary GI Physician: Jenetta Downer   Chief Complaint  Patient presents with   Diarrhea    3 month follow up on diarrhea. States diarrhea is better since following low fodmap diet. Concerned about weight loss. Last weight in May 119 and today 107 lbs.    HPI:   Sarah Page is a 81 y.o. female with past medical history of anemia, arthritis, stroke, visual impairment and diverticulitis (2019).  Patient presenting today for follow up of loose stools/suspected IBS   Patient last seen 09/19/21, at that time she reports she had spoken with her sister in law who is a Software engineer and that she recommended the low FODMAP diet. She states that she does not recall discussing this at her visit here  in February and wanted to see if I thought this would be something she should implement. She also did not start benefiber as discussed at last visit, however, has been keeping food journal. Had not seen much correlation in foods she is eating and symptoms, though she did eat some chili beans recently which gave her looser stools and abdominal discomfort. Reports she is having 1-3 BMs per day now.  She states that she did have TSH drawn but with her PCP, not through the requisition that was ordered here, however, was told that it was normal.   Patient called back in June with continued loose stools, stool studies checked then were negative. She reported eating FODMAP foods and having more looser stools, was again advised to continue with low FODMAP diet. Patient called back in July with continued loose stools when not doing low FODMAP diet. Scheduled for colonoscopy at that time.   Patient previously seen 5/16 with one looser stool per day, at that time, we discussed checking TSH, starting benefiber, doing ensure/boost 2-3x/day. low fodmap diet and food journal were discused with patient in February, however, she did  not implement these.   Present:  Patient states that her daughter had read that [facility] that coconut could help with IBS so she tried it and has had good results. Stools are almost back to normal eating coconut and avoiding triggers. Typically having one BM per day that are more formed. Has occasional abdominal cramping. Denies rectal bleeding or melena. Still doing low FODMAP diet for the most part and has recognized a lot of foods that she cannot tolerate such as garlic. She has cut out diet pepsi, applesauce and cheese as these tend to cause her more issues. She notes feeling more fatigued recently and not able to do as much activity as she previously did. She is down to 107 pounds today from 119 on 5/20.   Reports she had recent labs with PCP this week and was told everything was normal. Per chart review labs done in May with PCP hgb 12.7, normal renal function. TSH 3.2, Vit D 26. AST 42 and Alk Phos 151. Previous celiac testing negative.    Last Colonoscopy:08/04/15 diverticula throughout colon and internal hemorrhoids Last Endoscopy:never  Recommendations:    Past Medical History:  Diagnosis Date   Anemia    Arthritis    Gait disturbance    Rotator cuff tear    DR Ninfa Linden   Stroke [facility])    Vision abnormalities     Past Surgical History:  Procedure Laterality Date   ORIF CLAVICULAR FRACTURE Left     Current Outpatient Medications  Medication Sig Dispense  Refill   acetaminophen (TYLENOL) 650 MG CR tablet Take 650 mg by mouth every 8 (eight) hours as needed for pain.     amLODipine-olmesartan (AZOR) 5-20 MG tablet Take 1 tablet by mouth daily.     calcium carbonate (OSCAL) 1500 (600 Ca) MG TABS tablet Take 600 mg of elemental calcium by mouth daily with breakfast.     clopidogrel (PLAVIX) 75 MG tablet Take 75 mg by mouth daily.      Cyanocobalamin (VITAMIN B 12 PO) Take 1,000 mg by mouth daily.     ezetimibe (ZETIA) 10 MG tablet Take 10 mg by mouth daily.      hydroxychloroquine (PLAQUENIL) 200 MG tablet Take 200 mg by mouth daily.     leflunomide (ARAVA) 20 MG tablet Take 10 mg by mouth daily.     Melatonin 10 MG TABS Take 10 mg by mouth at bedtime.     OVER THE COUNTER MEDICATION Vit D daily 2,000 IU daily     Probiotic Product (ALIGN PO) Take by mouth. Align probiotic one daily     sertraline (ZOLOFT) 25 MG tablet Take 25 mg by mouth daily.     solifenacin (VESICARE) 5 MG tablet Take 5 mg by mouth daily.     Vibegron (GEMTESA) 75 MG TABS daily at 6 (six) AM.     Wheat Dextrin (BENEFIBER PO) Take by mouth. 2 gummies daily     polyethylene glycol-electrolytes (TRILYTE) 420 g solution Take 4,000 mLs by mouth as directed. (Patient not taking: Reported on 12/15/2021) 4000 mL 0   No current facility-administered medications for this visit.    Allergies as of 12/15/2021   (No Known Allergies)    Family History  Problem Relation Age of Onset   Stroke Mother    Transient ischemic attack Mother    Stroke Father    Heart attack Father    Mental illness Brother    Breast cancer Daughter 80       Sarah Page    Social History   Socioeconomic History   Marital status: Widowed    Spouse name: Not on file   Number of children: Not on file   Years of education: Not on file   Highest education level: Not on file  Occupational History   Not on file  Tobacco Use   Smoking status: Never    Passive exposure: Never   Smokeless tobacco: Never  Vaping Use   Vaping Use: Never used  Substance and Sexual Activity   Alcohol use: Yes    Comment: occasional wine   Drug use: Never   Sexual activity: Not on file  Other Topics Concern   Not on file  Social History Narrative   Not on file   Social Determinants of Health   Financial Resource Strain: Not on file  Food Insecurity: Not on file  Transportation Needs: Not on file  Physical Activity: Not on file  Stress: Not on file  Social Connections: Not on file    Review of systems General:  negative for night sweats, fever, chills +weight loss +fatigue  Neck: Negative for lumps, goiter, pain and significant neck swelling Resp: Negative for cough, wheezing, dyspnea at rest CV: Negative for chest pain, leg swelling, palpitations, orthopnea GI: denies melena, hematochezia, nausea, vomiting, diarrhea, constipation, dysphagia, odyonophagia, early satiety +weight loss  MSK: Negative for joint pain or swelling, back pain, and muscle pain. Derm: Negative for itching or rash Psych: Denies depression, anxiety, memory loss, confusion. No homicidal or suicidal  ideation.  Heme: Negative for prolonged bleeding, bruising easily, and swollen nodes. Endocrine: Negative for cold or heat intolerance, polyuria, polydipsia and goiter. Neuro: negative for tremor, gait imbalance, syncope and seizures. The remainder of the review of systems is noncontributory.  Physical Exam: BP 128/83 (BP Location: Left Arm, Patient Position: Sitting, Cuff Size: Normal)   Pulse (!) 102   Temp 98.2 F (36.8 C) (Oral)   Ht _0  (1.626 m)   Wt 107 lb 8 oz (48.8 kg)   BMI 18.45 kg/m  General:   Alert and oriented. No distress noted. Pleasant and cooperative.  Head:  Normocephalic and atraumatic. Eyes:  Conjuctiva clear without scleral icterus. Mouth:  Oral mucosa pink and moist. Good dentition. No lesions. Heart: Normal rate and rhythm, s1 and s2 heart sounds present.  Lungs: Clear lung sounds in all lobes. Respirations equal and unlabored. Abdomen:  +BS, soft, non-tender and non-distended. No rebound or guarding. No HSM or masses noted. Derm: No palmar erythema or jaundice Msk:  Symmetrical without gross deformities. Normal posture. Extremities:  Without edema. Neurologic:  Alert and  oriented x4 Psych:  Alert and cooperative. Normal mood and affect.  Invalid input(s): "6 MONTHS"   ASSESSMENT: Sarah Page is a 81 y.o. female presenting today for follow up of looser stools/suspected IBS.  Patient  doing better with more formed stools once daily since adhering to low FODMAP diet and using coconut. Having occasional abdominal cramping. She continues to lose weight and is down to 107 lbs today from 119 lbs in May. She denies rectal bleeding or melena but has fatigue. Recent labs done with PCP earlier this week were normal per patient. Notably has low levels of Vitamin D in her chart from May, on 2000 IU daily. TSH at that time was 3.2 She has colonoscopy scheduled for 9/5 for further evaluation, I encouraged her to continue with low FODMAP diet, avoiding known triggers and can use coconut PRN with good results. Ideally she should try to have 2 protein shakes per day to help with weight loss. Further recommendations regarding this after colonoscopy on 9/5    PLAN:  Continue low FODMAP diet  2. Continue coconut PRN with good result 3. Keep TCS appt 4. Boost/ensure shakes 2x/day for added nutrition 5. Further recommendations to follow after colonoscopy   All questions were answered, patient verbalized understanding and is in agreement with plan as outlined above.    Follow Up: TBD after colonoscopy   Sarah Page L. Alver Sorrow, MSN, APRN, AGNP-C Adult-Gerontology Nurse Practitioner [facility]

## 2021-12-15 NOTE — Patient Instructions (Signed)
Please continue with avoiding trigger foods and using coconut as needed Please keep colonoscopy appt It would be helpful for you to do a high protein shake atleast twice per day to avoid any further weight loss, if you are able to tolerate these

## 2021-12-20 NOTE — Patient Instructions (Signed)
Hjordis Howley Strohmeier  12/20/2021     @PREFPERIOPPHARMACY @   Your procedure is scheduled on  12/26/2021.   Report to [facility] at  [phone]  A.M.   Call this number if you have problems the morning of surgery:  [phone]   Remember:  Follow the diet and prep instructions given to you by the office.     Your last dose of plavix should be 12/20/2021.     Take these medicines the morning of surgery with A SIP OF WATER                          Azor, arava, zoloft, vesicare, gemtesa.     Do not wear jewelry, make-up or nail polish.  Do not wear lotions, powders, or perfumes, or deodorant.  Do not shave 48 hours prior to surgery.  Men may shave face and neck.  Do not bring valuables to the hospital.  [facility] is not responsible for any belongings or valuables.  Contacts, dentures or bridgework may not be worn into surgery.  Leave your suitcase in the car.  After surgery it may be brought to your room.  For patients admitted to the hospital, discharge time will be determined by your treatment team.  Patients discharged the day of surgery will not be allowed to drive home and must have someone with them for 24 hours.    Special instructions:   DO NOT smoke tobacco or vape for 24 hours before your procedure.  Please read over the following fact sheets that you were given. Anesthesia Post-op Instructions and Care and Recovery After Surgery      Colonoscopy, Adult, Care After The following information offers guidance on how to care for yourself after your procedure. Your health care provider may also give you more specific instructions. If you have problems or questions, contact your health care provider. What can I expect after the procedure? After the procedure, it is common to have: A small amount of blood in your stool for 24 hours after the procedure. Some gas. Mild cramping or bloating of your abdomen. Follow these instructions at home: Eating and  drinking  Drink enough fluid to keep your urine pale yellow. Follow instructions from your health care provider about eating or drinking restrictions. Resume your normal diet as told by your health care provider. Avoid heavy or fried foods that are hard to digest. Activity Rest as told by your health care provider. Avoid sitting for a long time without moving. Get up to take short walks every 1-2 hours. This is important to improve blood flow and breathing. Ask for help if you feel weak or unsteady. Return to your normal activities as told by your health care provider. Ask your health care provider what activities are safe for you. Managing cramping and bloating  Try walking around when you have cramps or feel bloated. If directed, apply heat to your abdomen as told by your health care provider. Use the heat source that your health care provider recommends, such as a moist heat pack or a heating pad. Place a towel between your skin and the heat source. Leave the heat on for 20-30 minutes. Remove the heat if your skin turns bright red. This is especially important if you are unable to feel pain, heat, or cold. You have a greater risk of getting burned. General instructions If you were given a sedative during the procedure, it  can affect you for several hours. Do not drive or operate machinery until your health care provider says that it is safe. For the first 24 hours after the procedure: Do not sign important documents. Do not drink alcohol. Do your regular daily activities at a slower pace than normal. Eat soft foods that are easy to digest. Take over-the-counter and prescription medicines only as told by your health care provider. Keep all follow-up visits. This is important. Contact a health care provider if: You have blood in your stool 2-3 days after the procedure. Get help right away if: You have more than a small spotting of blood in your stool. You have large blood clots in your  stool. You have swelling of your abdomen. You have nausea or vomiting. You have a fever. You have increasing pain in your abdomen that is not relieved with medicine. These symptoms may be an emergency. Get help right away. Call 911. Do not wait to see if the symptoms will go away. Do not drive yourself to the hospital. Summary After the procedure, it is common to have a small amount of blood in your stool. You may also have mild cramping and bloating of your abdomen. If you were given a sedative during the procedure, it can affect you for several hours. Do not drive or operate machinery until your health care provider says that it is safe. Get help right away if you have a lot of blood in your stool, nausea or vomiting, a fever, or increased pain in your abdomen. This information is not intended to replace advice given to you by your health care provider. Make sure you discuss any questions you have with your health care provider. Document Revised: 11/30/2020 Document Reviewed: 11/30/2020 Elsevier Patient Education  2023 [employer] Monitored Anesthesia Care, Care After This sheet gives you information about how to care for yourself after your procedure. Your health care provider may also give you more specific instructions. If you have problems or questions, contact your health care provider. What can I expect after the procedure? After the procedure, it is common to have: Tiredness. Forgetfulness about what happened after the procedure. Impaired judgment for important decisions. Nausea or vomiting. Some difficulty with balance. Follow these instructions at home: For the time period you were told by your health care provider:     Rest as needed. Do not participate in activities where you could fall or become injured. Do not drive or use machinery. Do not drink alcohol. Do not take sleeping pills or medicines that cause drowsiness. Do not make important decisions or sign legal  documents. Do not take care of children on your own. Eating and drinking Follow the diet that is recommended by your health care provider. Drink enough fluid to keep your urine pale yellow. If you vomit: Drink water, juice, or soup when you can drink without vomiting. Make sure you have little or no nausea before eating solid foods. General instructions Have a responsible adult stay with you for the time you are told. It is important to have someone help care for you until you are awake and alert. Take over-the-counter and prescription medicines only as told by your health care provider. If you have sleep apnea, surgery and certain medicines can increase your risk for breathing problems. Follow instructions from your health care provider about wearing your sleep device: Anytime you are sleeping, including during daytime naps. While taking prescription pain medicines, sleeping medicines, or medicines that make you drowsy. Avoid  smoking. Keep all follow-up visits as told by your health care provider. This is important. Contact a health care provider if: You keep feeling nauseous or you keep vomiting. You feel light-headed. You are still sleepy or having trouble with balance after 24 hours. You develop a rash. You have a fever. You have redness or swelling around the IV site. Get help right away if: You have trouble breathing. You have new-onset confusion at home. Summary For several hours after your procedure, you may feel tired. You may also be forgetful and have poor judgment. Have a responsible adult stay with you for the time you are told. It is important to have someone help care for you until you are awake and alert. Rest as told. Do not drive or operate machinery. Do not drink alcohol or take sleeping pills. Get help right away if you have trouble breathing, or if you suddenly become confused. This information is not intended to replace advice given to you by your health care  provider. Make sure you discuss any questions you have with your health care provider. Document Revised: 03/14/2021 Document Reviewed: 03/12/2019 Elsevier Patient Education  2023 [employer].

## 2021-12-21 ENCOUNTER — Encounter (HOSPITAL_COMMUNITY)
Admission: RE | Admit: 2021-12-21 | Discharge: 2021-12-21 | Disposition: A | Discharge status: Home / Self Care | Payer: Medicare PPO | Source: Ambulatory Visit | Attending: Gastroenterology | Admitting: Gastroenterology

## 2021-12-21 ENCOUNTER — Encounter (HOSPITAL_COMMUNITY): Payer: Self-pay

## 2021-12-21 VITALS — Temp 98.2°F

## 2021-12-21 DIAGNOSIS — D649 Anemia, unspecified: Secondary | ICD-10-CM | POA: Insufficient documentation

## 2021-12-21 DIAGNOSIS — I10 Essential (primary) hypertension: Secondary | ICD-10-CM | POA: Insufficient documentation

## 2021-12-21 DIAGNOSIS — R195 Other fecal abnormalities: Secondary | ICD-10-CM | POA: Insufficient documentation

## 2021-12-21 DIAGNOSIS — Z01818 Encounter for other preprocedural examination: Secondary | ICD-10-CM | POA: Insufficient documentation

## 2021-12-21 LAB — CBC WITH DIFFERENTIAL/PLATELET
Abs Immature Granulocytes: 0.02 10*3/uL (ref 0.00–0.07)
Basophils Absolute: 0.1 10*3/uL (ref 0.0–0.1)
Basophils Relative: 2 %
Eosinophils Absolute: 0.1 10*3/uL (ref 0.0–0.5)
Eosinophils Relative: 2 %
HCT: 35.3 % — ABNORMAL LOW (ref 36.0–46.0)
Hemoglobin: 11.4 g/dL — ABNORMAL LOW (ref 12.0–15.0)
Immature Granulocytes: 0 %
Lymphocytes Relative: 7 %
Lymphs Abs: 0.5 10*3/uL — ABNORMAL LOW (ref 0.7–4.0)
MCH: 30.4 pg (ref 26.0–34.0)
MCHC: 32.3 g/dL (ref 30.0–36.0)
MCV: 94.1 fL (ref 80.0–100.0)
Monocytes Absolute: 0.7 10*3/uL (ref 0.1–1.0)
Monocytes Relative: 10 %
Neutro Abs: 5.3 10*3/uL (ref 1.7–7.7)
Neutrophils Relative %: 79 %
Platelets: 254 10*3/uL (ref 150–400)
RBC: 3.75 MIL/uL — ABNORMAL LOW (ref 3.87–5.11)
RDW: 14.3 % (ref 11.5–15.5)
WBC: 6.6 10*3/uL (ref 4.0–10.5)
nRBC: 0 % (ref 0.0–0.2)

## 2021-12-26 ENCOUNTER — Encounter (HOSPITAL_COMMUNITY): Payer: Self-pay | Admitting: Gastroenterology

## 2021-12-26 ENCOUNTER — Ambulatory Visit (HOSPITAL_COMMUNITY)
Admission: RE | Admit: 2021-12-26 | Discharge: 2021-12-26 | Disposition: A | Discharge status: Home / Self Care | Payer: Medicare PPO | Attending: Gastroenterology | Admitting: Gastroenterology

## 2021-12-26 ENCOUNTER — Ambulatory Visit (HOSPITAL_BASED_OUTPATIENT_CLINIC_OR_DEPARTMENT_OTHER): Payer: Medicare PPO | Admitting: Anesthesiology

## 2021-12-26 ENCOUNTER — Other Ambulatory Visit: Payer: Self-pay

## 2021-12-26 ENCOUNTER — Encounter (HOSPITAL_COMMUNITY)
Admission: RE | Disposition: A | Discharge status: Home / Self Care | Payer: Self-pay | Source: Home / Self Care | Attending: Gastroenterology

## 2021-12-26 ENCOUNTER — Ambulatory Visit (HOSPITAL_COMMUNITY): Payer: Medicare PPO | Admitting: Anesthesiology

## 2021-12-26 DIAGNOSIS — K529 Noninfective gastroenteritis and colitis, unspecified: Secondary | ICD-10-CM

## 2021-12-26 DIAGNOSIS — Q438 Other specified congenital malformations of intestine: Secondary | ICD-10-CM | POA: Insufficient documentation

## 2021-12-26 DIAGNOSIS — N179 Acute kidney failure, unspecified: Secondary | ICD-10-CM

## 2021-12-26 DIAGNOSIS — T182XXA Foreign body in stomach, initial encounter: Secondary | ICD-10-CM

## 2021-12-26 DIAGNOSIS — K2289 Other specified disease of esophagus: Secondary | ICD-10-CM

## 2021-12-26 DIAGNOSIS — K920 Hematemesis: Secondary | ICD-10-CM

## 2021-12-26 DIAGNOSIS — K644 Residual hemorrhoidal skin tags: Secondary | ICD-10-CM | POA: Insufficient documentation

## 2021-12-26 DIAGNOSIS — D649 Anemia, unspecified: Secondary | ICD-10-CM | POA: Insufficient documentation

## 2021-12-26 DIAGNOSIS — Z8719 Personal history of other diseases of the digestive system: Secondary | ICD-10-CM

## 2021-12-26 DIAGNOSIS — Z8673 Personal history of transient ischemic attack (TIA), and cerebral infarction without residual deficits: Secondary | ICD-10-CM | POA: Insufficient documentation

## 2021-12-26 DIAGNOSIS — K5730 Diverticulosis of large intestine without perforation or abscess without bleeding: Secondary | ICD-10-CM | POA: Diagnosis not present

## 2021-12-26 DIAGNOSIS — K259 Gastric ulcer, unspecified as acute or chronic, without hemorrhage or perforation: Secondary | ICD-10-CM

## 2021-12-26 DIAGNOSIS — D759 Disease of blood and blood-forming organs, unspecified: Secondary | ICD-10-CM | POA: Diagnosis not present

## 2021-12-26 DIAGNOSIS — K921 Melena: Secondary | ICD-10-CM

## 2021-12-26 DIAGNOSIS — K449 Diaphragmatic hernia without obstruction or gangrene: Secondary | ICD-10-CM | POA: Diagnosis not present

## 2021-12-26 DIAGNOSIS — R195 Other fecal abnormalities: Secondary | ICD-10-CM

## 2021-12-26 DIAGNOSIS — K2211 Ulcer of esophagus with bleeding: Secondary | ICD-10-CM

## 2021-12-26 HISTORY — PX: BIOPSY: SHX5522

## 2021-12-26 HISTORY — PX: COLONOSCOPY WITH PROPOFOL: SHX5780

## 2021-12-26 SURGERY — COLONOSCOPY WITH PROPOFOL
Anesthesia: General

## 2021-12-26 MED ORDER — LACTATED RINGERS IV SOLN: INTRAVENOUS | Status: DC

## 2021-12-26 MED ORDER — PROPOFOL 500 MG/50ML IV EMUL
INTRAVENOUS | Status: DC | PRN
  Administered 2021-12-26: 200 ug/kg/min via INTRAVENOUS

## 2021-12-26 MED ORDER — PROPOFOL 10 MG/ML IV BOLUS
INTRAVENOUS | Status: DC | PRN
  Administered 2021-12-26: 10 mg via INTRAVENOUS
  Administered 2021-12-26: 20 mg via INTRAVENOUS

## 2021-12-26 MED ORDER — PROPOFOL 1000 MG/100ML IV EMUL
INTRAVENOUS | Status: AC
  Filled 2021-12-26: qty 100

## 2021-12-26 MED ORDER — LIDOCAINE HCL (CARDIAC) PF 100 MG/5ML IV SOSY
PREFILLED_SYRINGE | INTRAVENOUS | Status: DC | PRN
  Administered 2021-12-26: 30 mg via INTRAVENOUS

## 2021-12-26 MED ORDER — LACTATED RINGERS IV SOLN: INTRAVENOUS | Status: DC | PRN

## 2021-12-26 NOTE — Discharge Instructions (Signed)
You are being discharged to home.  Resume your previous diet - try to liberalize diet but write down food groups that cause reccurent diarrhea or GI symptoms. We are waiting for your pathology results.  Your physician has indicated that a repeat colonoscopy is not recommended due to your current age (33 years or older) for screening purposes.

## 2021-12-26 NOTE — Anesthesia Preprocedure Evaluation (Addendum)
Anesthesia Evaluation  Patient identified by MRN, date of birth, ID band Patient awake    Reviewed: Allergy & Precautions, NPO status , Patient's Chart, lab work & pertinent test results  Airway Mallampati: II  TM Distance: >3 FB Neck ROM: Full    Dental  (+) Dental Advisory Given, Teeth Intact   Pulmonary neg pulmonary ROS,    Pulmonary exam normal breath sounds clear to auscultation       Cardiovascular Normal cardiovascular exam Rhythm:Regular Rate:Normal  21-Dec-2021 10:21:57 [facility]-OPS ROUTINE RECORD 11/20/1940 (80 yr) Female Caucasian Vent. rate 94 BPM PR interval 138 ms QRS duration 66 ms QT/QTcB 362/452 ms P-R-T axes 75 91 74 Sinus rhythm with Premature ventricular complexes Rightward axis Low voltage QRS Borderline ECG When compared with ECG of 05-Jun-2020 17:27, No significant change since last tracing PREVIOUS ECG IS PRESENT Confirmed by Carylon Perches [phone]) on 12/22/2021 6:35:18 AM   Neuro/Psych CVA, Residual Symptoms negative psych ROS   GI/Hepatic negative GI ROS, Neg liver ROS,   Endo/Other  negative endocrine ROS  Renal/GU Renal disease (AKI)  negative genitourinary   Musculoskeletal  (+) Arthritis , Osteoarthritis,    Abdominal   Peds negative pediatric ROS (+)  Hematology  (+) Blood dyscrasia, anemia ,   Anesthesia Other Findings   Reproductive/Obstetrics negative OB ROS                            Anesthesia Physical Anesthesia Plan  ASA: 3  Anesthesia Plan: General   Post-op Pain Management: Minimal or no pain anticipated   Induction: Intravenous  PONV Risk Score and Plan: Propofol infusion  Airway Management Planned: Nasal Cannula and Natural Airway  Additional Equipment:   Intra-op Plan:   Post-operative Plan:   Informed Consent: I have reviewed the patients History and Physical, chart, labs and discussed the procedure including  the risks, benefits and alternatives for the proposed anesthesia with the patient or authorized representative who has indicated his/her understanding and acceptance.     Dental advisory given  Plan Discussed with: CRNA and Surgeon  Anesthesia Plan Comments:        Anesthesia Quick Evaluation

## 2021-12-26 NOTE — Anesthesia Postprocedure Evaluation (Signed)
Anesthesia Post Note  Patient: [name]  Procedure(s) Performed: COLONOSCOPY WITH PROPOFOL BIOPSY  Patient location during evaluation: Phase II Anesthesia Type: General Level of consciousness: awake and alert and oriented Pain management: pain level controlled Vital Signs Assessment: post-procedure vital signs reviewed and stable Respiratory status: spontaneous breathing, nonlabored ventilation and respiratory function stable Cardiovascular status: blood pressure returned to baseline and stable Postop Assessment: no apparent nausea or vomiting Anesthetic complications: no   No notable events documented.   Last Vitals:  Vitals:   12/26/21 0904 12/26/21 0905  BP: (!) 94/50 98/61  Pulse: 83   Resp: 16   Temp: 36.7 C   SpO2: 95%     Last Pain:  Vitals:   12/26/21 0904  TempSrc: Oral  PainSc: 0-No pain                 [name] C Sahvanna Mcmanigal

## 2021-12-26 NOTE — Transfer of Care (Signed)
Immediate Anesthesia Transfer of Care Note  Patient: Sarah Page  Procedure(s) Performed: COLONOSCOPY WITH PROPOFOL BIOPSY  Patient Location: PACU  Anesthesia Type:General  Level of Consciousness: awake, alert , oriented and patient cooperative  Airway & Oxygen Therapy: Patient Spontanous Breathing and Patient connected to nasal cannula oxygen  Post-op Assessment: Report given to RN, Post -op Vital signs reviewed and stable and Patient moving all extremities X 4  Post vital signs: Reviewed and stable  Last Vitals:  Vitals Value Taken Time  BP    Temp    Pulse    Resp    SpO2      Last Pain:  Vitals:   12/26/21 0822  PainSc: 0-No pain         Complications: No notable events documented.

## 2021-12-26 NOTE — Interval H&P Note (Signed)
History and Physical Interval Note:  12/26/2021 8:19 AM  Sarah Page  has presented today for surgery, with the diagnosis of Loose Stools hx of diverticulitis.  The various methods of treatment have been discussed with the patient and family. After consideration of risks, benefits and other options for treatment, the patient has consented to  Procedure(s) with comments: COLONOSCOPY WITH PROPOFOL (N/A) - 645  ASA 3 as a surgical intervention.  The patient's history has been reviewed, patient examined, no change in status, stable for surgery.  I have reviewed the patient's chart and labs.  Questions were answered to the patient's satisfaction.     [name]

## 2021-12-26 NOTE — Op Note (Signed)
[facility] Patient Name: Sarah Page Procedure Date: 12/26/2021 8:05 AM MRN: [id number] Date of Birth: 1941-03-21 Attending MD: Katrinka Blazing ,  CSN: [id number] Age: 81 Admit Type: Outpatient Procedure:                Colonoscopy Indications:              Chronic diarrhea Providers:                Katrinka Blazing, Jannett Celestine, RN, Pandora Leiter,                            Technician Referring MD:              Medicines:                Monitored Anesthesia Care Complications:            No immediate complications. Estimated Blood Loss:     Estimated blood loss: none. Procedure:                Pre-Anesthesia Assessment:                           - Prior to the procedure, a History and Physical                            was performed, and patient medications, allergies                            and sensitivities were reviewed. The patient's                            tolerance of previous anesthesia was reviewed.                           - The risks and benefits of the procedure and the                            sedation options and risks were discussed with the                            patient. All questions were answered and informed                            consent was obtained.                           - ASA Grade Assessment: II - A patient with mild                            systemic disease.                           After obtaining informed consent, the colonoscope                            was passed under direct vision. Throughout the  procedure, the patient's blood pressure, pulse, and                            oxygen saturations were monitored continuously. The                            [id number]-HQ190L ([id number]) scope was introduced through                            the anus and advanced to the the cecum, identified                            by appendiceal orifice and ileocecal valve. The                            patient  tolerated the procedure well. The quality                            of the bowel preparation was good. The colonoscopy                            was performed with moderate difficulty due to a                            tortuous colon. Scope In: 8:26:33 AM Scope Out: 8:57:16 AM Scope Withdrawal Time: 0 hours 15 minutes 23 seconds  Total Procedure Duration: 0 hours 30 minutes 43 seconds  Findings:      External hemorrhoids were found on perianal exam.      Multiple small and large-mouthed diverticula were found in the entire       colon.      The colon (entire examined portion) appeared normal. Biopsies for       histology were taken with a cold forceps from the right colon and left       colon for evaluation of microscopic colitis.      The sigmoid colon was significantly tortuous.      The retroflexed view of the distal rectum and anal verge was normal and       showed no anal or rectal abnormalities. Impression:               - Hemorrhoids found on perianal exam.                           - Diverticulosis in the entire examined colon.                           - The entire examined colon is normal. Biopsied.                           - Tortuous colon.                           - The distal rectum and anal verge are normal on  retroflexion view. Moderate Sedation:      Per Anesthesia Care Recommendation:           - Discharge patient to home (ambulatory).                           - Resume previous diet - try to liberalize diet but                            write down food groups that cause reccurent                            diarrhea or GI symptoms.                           - Await pathology results.                           - Repeat colonoscopy is not recommended due to                            current age (6 years or older) for screening                            purposes. Procedure Code(s):        --- Professional ---                            [phone], Colonoscopy, flexible; with biopsy, single                            or multiple Diagnosis Code(s):        --- Professional ---                           K64.9, Unspecified hemorrhoids                           K52.9, Noninfective gastroenteritis and colitis,                            unspecified                           K57.30, Diverticulosis of large intestine without                            perforation or abscess without bleeding                           Q43.8, Other specified congenital malformations of                            intestine CPT copyright 2019 American Medical Association. All rights reserved. The codes documented in this report are preliminary and upon coder review may  be revised to meet current compliance requirements. Katrinka Blazing, MD Katrinka Blazing,  12/26/2021 9:11:11 AM This report has been signed electronically.  Number of Addenda: 0 

## 2021-12-27 ENCOUNTER — Other Ambulatory Visit: Payer: Self-pay | Admitting: Gastroenterology

## 2021-12-27 DIAGNOSIS — K580 Irritable bowel syndrome with diarrhea: Secondary | ICD-10-CM

## 2021-12-27 LAB — SURGICAL PATHOLOGY

## 2021-12-27 MED ORDER — COLESTIPOL HCL 1 G PO TABS: 2.0000 g | ORAL_TABLET | Freq: Every day | ORAL | 3 refills | Status: DC

## 2022-01-02 ENCOUNTER — Inpatient Hospital Stay (HOSPITAL_COMMUNITY)
Admission: EM | Admit: 2022-01-02 | Discharge: 2022-01-05 | DRG: 378 | Disposition: A | Discharge status: Home / Self Care | Payer: Medicare PPO | Attending: Family Medicine | Admitting: Family Medicine

## 2022-01-02 ENCOUNTER — Emergency Department (HOSPITAL_COMMUNITY): Payer: Medicare PPO

## 2022-01-02 ENCOUNTER — Encounter (HOSPITAL_COMMUNITY): Payer: Self-pay

## 2022-01-02 ENCOUNTER — Other Ambulatory Visit: Payer: Self-pay

## 2022-01-02 DIAGNOSIS — N3281 Overactive bladder: Secondary | ICD-10-CM

## 2022-01-02 DIAGNOSIS — R112 Nausea with vomiting, unspecified: Principal | ICD-10-CM

## 2022-01-02 DIAGNOSIS — K449 Diaphragmatic hernia without obstruction or gangrene: Secondary | ICD-10-CM

## 2022-01-02 DIAGNOSIS — I700 Atherosclerosis of aorta: Secondary | ICD-10-CM | POA: Diagnosis present

## 2022-01-02 DIAGNOSIS — I10 Essential (primary) hypertension: Secondary | ICD-10-CM

## 2022-01-02 DIAGNOSIS — Z79899 Other long term (current) drug therapy: Secondary | ICD-10-CM

## 2022-01-02 DIAGNOSIS — K922 Gastrointestinal hemorrhage, unspecified: Secondary | ICD-10-CM | POA: Diagnosis present

## 2022-01-02 DIAGNOSIS — E876 Hypokalemia: Secondary | ICD-10-CM | POA: Diagnosis present

## 2022-01-02 DIAGNOSIS — D62 Acute posthemorrhagic anemia: Secondary | ICD-10-CM | POA: Diagnosis present

## 2022-01-02 DIAGNOSIS — K2211 Ulcer of esophagus with bleeding: Secondary | ICD-10-CM | POA: Diagnosis present

## 2022-01-02 DIAGNOSIS — Z8249 Family history of ischemic heart disease and other diseases of the circulatory system: Secondary | ICD-10-CM

## 2022-01-02 DIAGNOSIS — Z8673 Personal history of transient ischemic attack (TIA), and cerebral infarction without residual deficits: Secondary | ICD-10-CM

## 2022-01-02 DIAGNOSIS — K5733 Diverticulitis of large intestine without perforation or abscess with bleeding: Secondary | ICD-10-CM | POA: Diagnosis not present

## 2022-01-02 DIAGNOSIS — E782 Mixed hyperlipidemia: Secondary | ICD-10-CM

## 2022-01-02 DIAGNOSIS — R630 Anorexia: Secondary | ICD-10-CM | POA: Diagnosis present

## 2022-01-02 DIAGNOSIS — Z681 Body mass index (BMI) 19 or less, adult: Secondary | ICD-10-CM

## 2022-01-02 DIAGNOSIS — E860 Dehydration: Secondary | ICD-10-CM | POA: Diagnosis not present

## 2022-01-02 DIAGNOSIS — K2901 Acute gastritis with bleeding: Secondary | ICD-10-CM

## 2022-01-02 DIAGNOSIS — Z823 Family history of stroke: Secondary | ICD-10-CM

## 2022-01-02 DIAGNOSIS — R197 Diarrhea, unspecified: Secondary | ICD-10-CM | POA: Diagnosis present

## 2022-01-02 DIAGNOSIS — F32A Depression, unspecified: Secondary | ICD-10-CM | POA: Diagnosis present

## 2022-01-02 DIAGNOSIS — R109 Unspecified abdominal pain: Secondary | ICD-10-CM

## 2022-01-02 DIAGNOSIS — K529 Noninfective gastroenteritis and colitis, unspecified: Secondary | ICD-10-CM | POA: Diagnosis present

## 2022-01-02 DIAGNOSIS — Z7902 Long term (current) use of antithrombotics/antiplatelets: Secondary | ICD-10-CM

## 2022-01-02 DIAGNOSIS — K649 Unspecified hemorrhoids: Secondary | ICD-10-CM | POA: Diagnosis present

## 2022-01-02 DIAGNOSIS — G4700 Insomnia, unspecified: Secondary | ICD-10-CM

## 2022-01-02 DIAGNOSIS — K5732 Diverticulitis of large intestine without perforation or abscess without bleeding: Secondary | ICD-10-CM

## 2022-01-02 DIAGNOSIS — H547 Unspecified visual loss: Secondary | ICD-10-CM | POA: Diagnosis present

## 2022-01-02 DIAGNOSIS — K5792 Diverticulitis of intestine, part unspecified, without perforation or abscess without bleeding: Secondary | ICD-10-CM | POA: Diagnosis present

## 2022-01-02 DIAGNOSIS — R531 Weakness: Secondary | ICD-10-CM

## 2022-01-02 DIAGNOSIS — R634 Abnormal weight loss: Secondary | ICD-10-CM | POA: Diagnosis present

## 2022-01-02 DIAGNOSIS — K254 Chronic or unspecified gastric ulcer with hemorrhage: Secondary | ICD-10-CM | POA: Diagnosis present

## 2022-01-02 LAB — URINALYSIS, ROUTINE W REFLEX MICROSCOPIC
Bilirubin Urine: NEGATIVE
Glucose, UA: NEGATIVE mg/dL
Hgb urine dipstick: NEGATIVE
Ketones, ur: NEGATIVE mg/dL
Nitrite: NEGATIVE
Protein, ur: NEGATIVE mg/dL
Specific Gravity, Urine: 1.017 (ref 1.005–1.030)
Trans Epithel, UA: <1
pH: 7 (ref 5.0–8.0)

## 2022-01-02 LAB — CBC
HCT: 36.7 % (ref 36.0–46.0)
Hemoglobin: 11.8 g/dL — ABNORMAL LOW (ref 12.0–15.0)
MCH: 30.2 pg (ref 26.0–34.0)
MCHC: 32.2 g/dL (ref 30.0–36.0)
MCV: 93.9 fL (ref 80.0–100.0)
Platelets: 316 10*3/uL (ref 150–400)
RBC: 3.91 MIL/uL (ref 3.87–5.11)
RDW: 14.2 % (ref 11.5–15.5)
WBC: 8.8 10*3/uL (ref 4.0–10.5)
nRBC: 0 % (ref 0.0–0.2)

## 2022-01-02 LAB — COMPREHENSIVE METABOLIC PANEL
ALT: 27 U/L (ref 0–44)
AST: 36 U/L (ref 15–41)
Albumin: 4.2 g/dL (ref 3.5–5.0)
Alkaline Phosphatase: 57 U/L (ref 38–126)
Anion gap: 12 (ref 5–15)
BUN: 31 mg/dL — ABNORMAL HIGH (ref 8–23)
CO2: 29 mmol/L (ref 22–32)
Calcium: 9.2 mg/dL (ref 8.9–10.3)
Chloride: 93 mmol/L — ABNORMAL LOW (ref 98–111)
Creatinine, Ser: 0.95 mg/dL (ref 0.44–1.00)
GFR, Estimated: >60 mL/min (ref >60)
Glucose, Bld: 131 mg/dL — ABNORMAL HIGH (ref 70–99)
Potassium: 4.3 mmol/L (ref 3.5–5.1)
Sodium: 134 mmol/L — ABNORMAL LOW (ref 135–145)
Total Bilirubin: 0.9 mg/dL (ref 0.3–1.2)
Total Protein: 7.7 g/dL (ref 6.5–8.1)

## 2022-01-02 LAB — TROPONIN I (HIGH SENSITIVITY): Troponin I (High Sensitivity): 8 ng/L (ref <18)

## 2022-01-02 LAB — POC OCCULT BLOOD, ED: Fecal Occult Bld: POSITIVE — AB

## 2022-01-02 MED ORDER — PIPERACILLIN-TAZOBACTAM 3.375 G IVPB 30 MIN
3.3750 g | Freq: Once | INTRAVENOUS | Status: AC
  Administered 2022-01-02: 3.375 g via INTRAVENOUS
  Filled 2022-01-02: qty 50

## 2022-01-02 MED ORDER — ONDANSETRON HCL 4 MG/2ML IJ SOLN
4.0000 mg | Freq: Once | INTRAMUSCULAR | Status: AC
  Administered 2022-01-02: 4 mg via INTRAVENOUS
  Filled 2022-01-02: qty 2

## 2022-01-02 MED ORDER — SODIUM CHLORIDE 0.9 % IV BOLUS
1000.0000 mL | Freq: Once | INTRAVENOUS | Status: AC
  Administered 2022-01-02: 1000 mL via INTRAVENOUS

## 2022-01-02 MED ORDER — PANTOPRAZOLE SODIUM 40 MG IV SOLR
40.0000 mg | Freq: Once | INTRAVENOUS | Status: AC
  Administered 2022-01-02: 40 mg via INTRAVENOUS
  Filled 2022-01-02: qty 10

## 2022-01-02 MED ORDER — IOHEXOL 300 MG/ML  SOLN
100.0000 mL | Freq: Once | INTRAMUSCULAR | Status: AC | PRN
  Administered 2022-01-02: 100 mL via INTRAVENOUS

## 2022-01-02 NOTE — ED Provider Notes (Signed)
[facility] EMERGENCY DEPARTMENT Provider Note   CSN: [id number] Arrival date & time: 01/02/22  1829     History  Chief Complaint  Patient presents with   Emesis    Sarah Page is a 81 y.o. female.  Patient with nausea, vomiting and diarrhea in the past couple days. Pt indicates had colonoscopy on 9/5 - no acute process noted then. States colonoscopy was for chronic, recurrent diarrhea in the past 6 months, with decreased appetite and ~ 20 lb wt loss. Pt indicates during that time had tried special diet to help identify which foods may be contributing.  Colonoscopy showed hemorrhoid and diverticula of colon but no acute process. In past two days w emesis, pt reports coffee ground emesis. No hx gastritis or pud. Last evening had episode of chest heaviness. No other recent cp or discomfort. Some abd cramping/discomfort. Few diarrhea stools, loose to watery, not bloody. Notes in past day stools darker than normal, no frank melena or bloody. No recent abx tx. No known ill contacts. No fever or chills. No dysuria.   The history is provided by the patient, medical records and a relative.  Emesis Associated symptoms: diarrhea   Associated symptoms: no chills, no cough, no fever and no headaches        Home Medications Prior to Admission medications   Medication Sig Start Date End Date Taking? Authorizing Provider  acetaminophen (TYLENOL) 650 MG CR tablet Take 1,300 mg by mouth every 8 (eight) hours.    [provider]  amLODipine-olmesartan (AZOR) 5-20 MG tablet Take 1 tablet by mouth daily. 05/25/21   [provider]  calcium carbonate (OSCAL) 1500 (600 Ca) MG TABS tablet Take 600 mg of elemental calcium by mouth in the morning and at bedtime.    [provider]  Cholecalciferol (VITAMIN D) 50 MCG (2000 UT) CAPS Take 2,000 Units by mouth daily.    [provider]  clopidogrel (PLAVIX) 75 MG tablet Take 75 mg by mouth daily.  03/10/18   [provider]  colestipol (COLESTID) 1 g tablet Take 2 tablets (2 g total) by mouth daily. Take 4 hours apart from other medicines 12/27/21   Marguerita Merles, Reuel Boom, MD  Cyanocobalamin (VITAMIN B 12 PO) Take 1,000 mg by mouth daily.    [provider]  ezetimibe (ZETIA) 10 MG tablet Take 10 mg by mouth daily.    [provider]  FIBER ADULT GUMMIES PO Take 2 Pieces by mouth daily.    [provider]  hydroxychloroquine (PLAQUENIL) 200 MG tablet Take 200 mg by mouth daily. 05/25/21   [provider]  leflunomide (ARAVA) 10 MG tablet Take 10 mg by mouth daily. 05/25/21   [provider]  Melatonin 10 MG TABS Take 10 mg by mouth at bedtime.    [provider]  polyvinyl alcohol (LIQUIFILM TEARS) 1.4 % ophthalmic solution Place 1 drop into both eyes as needed for dry eyes.    [provider]  Probiotic Product (ALIGN PO) Take 1 capsule by mouth daily.    [provider]  sertraline (ZOLOFT) 25 MG tablet Take 25 mg by mouth daily.    [provider]  solifenacin (VESICARE) 5 MG tablet Take 5 mg by mouth daily.    [provider]  Vibegron (GEMTESA) 75 MG TABS Take 75 mg by mouth daily at 6 (six) AM.    [provider]      Allergies    Patient has no  known allergies.    Review of Systems   Review of Systems  Constitutional:  Positive for appetite change. Negative for chills and fever.  Eyes:  Negative for redness.  Respiratory:  Negative for cough and shortness of breath.   Cardiovascular:  Negative for chest pain.  Gastrointestinal:  Positive for diarrhea and vomiting.  Genitourinary:  Negative for dysuria and flank pain.  Musculoskeletal:  Negative for back pain and neck pain.  Skin:  Negative for rash.  Neurological:  Positive for weakness. Negative for headaches.  Hematological:  Does not bruise/bleed easily.  Psychiatric/Behavioral:  Negative for confusion.     Physical Exam Updated  Vital Signs BP (!) 149/80   Pulse 71   Temp 97.7 F (36.5 C)   Resp 18   Ht 1.626 m ( )   Wt 48.5 kg   SpO2 96%   BMI 18.37 kg/m  Physical Exam Vitals and nursing note reviewed.  Constitutional:      Appearance: Normal appearance. She is well-developed.  HENT:     Head: Atraumatic.     Nose: Nose normal.     Mouth/Throat:     Mouth: Mucous membranes are moist.  Eyes:     General: No scleral icterus.    Conjunctiva/sclera: Conjunctivae normal.     Pupils: Pupils are equal, round, and reactive to light.  Neck:     Trachea: No tracheal deviation.     Comments: Thyroid not grossly enlarged or tender.  Cardiovascular:     Rate and Rhythm: Normal rate and regular rhythm.     Pulses: Normal pulses.     Heart sounds: Normal heart sounds. No murmur heard.    No friction rub. No gallop.  Pulmonary:     Effort: Pulmonary effort is normal. No respiratory distress.     Breath sounds: Normal breath sounds.  Abdominal:     General: Bowel sounds are normal. There is no distension.     Palpations: Abdomen is soft. There is no mass.     Tenderness: There is abdominal tenderness.     Comments: Mild epigastric and lower abd tenderness.   Genitourinary:    Comments: No cva tenderness. Stool is very dark brown, heme pos.  Musculoskeletal:        General: No swelling or tenderness.     Cervical back: Normal range of motion and neck supple. No rigidity. No muscular tenderness.  Skin:    General: Skin is warm and dry.     Findings: No rash.  Neurological:     Mental Status: She is alert.     Comments: Alert, speech normal.   Psychiatric:        Mood and Affect: Mood normal.     ED Results / Procedures / Treatments   Labs (all labs ordered are listed, but only abnormal results are displayed) Results for orders placed or performed during the hospital encounter of 01/02/22  CBC  Result Value Ref Range   WBC 8.8 4.0 - 10.5 K/uL   RBC 3.91 3.87 - 5.11 MIL/uL   Hemoglobin 11.8  (L) 12.0 - 15.0 g/dL   HCT 40.9 81.1 - 91.4 %   MCV 93.9 80.0 - 100.0 fL   MCH 30.2 26.0 - 34.0 pg   MCHC 32.2 30.0 - 36.0 g/dL   RDW 78.2 95.6 - 21.3 %   Platelets 316 150 - 400 K/uL   nRBC 0.0 0.0 - 0.2 %  Comprehensive metabolic panel  Result Value Ref Range  Sodium 134 (L) 135 - 145 mmol/L   Potassium 4.3 3.5 - 5.1 mmol/L   Chloride 93 (L) 98 - 111 mmol/L   CO2 29 22 - 32 mmol/L   Glucose, Bld 131 (H) 70 - 99 mg/dL   BUN 31 (H) 8 - 23 mg/dL   Creatinine, Ser 4.54 0.44 - 1.00 mg/dL   Calcium 9.2 8.9 - 09.8 mg/dL   Total Protein 7.7 6.5 - 8.1 g/dL   Albumin 4.2 3.5 - 5.0 g/dL   AST 36 15 - 41 U/L   ALT 27 0 - 44 U/L   Alkaline Phosphatase 57 38 - 126 U/L   Total Bilirubin 0.9 0.3 - 1.2 mg/dL   GFR, Estimated >11 >91 mL/min   Anion gap 12 5 - 15  Urinalysis, Routine w reflex microscopic Urine, Clean Catch  Result Value Ref Range   Color, Urine YELLOW YELLOW   APPearance HAZY (A) CLEAR   Specific Gravity, Urine 1.017 1.005 - 1.030   pH 7.0 5.0 - 8.0   Glucose, UA NEGATIVE NEGATIVE mg/dL   Hgb urine dipstick NEGATIVE NEGATIVE   Bilirubin Urine NEGATIVE NEGATIVE   Ketones, ur NEGATIVE NEGATIVE mg/dL   Protein, ur NEGATIVE NEGATIVE mg/dL   Nitrite NEGATIVE NEGATIVE   Leukocytes,Ua LARGE (A) NEGATIVE   RBC / HPF 0-5 0 - 5 RBC/hpf   WBC, UA 11-20 0 - 5 WBC/hpf   Bacteria, UA RARE (A) NONE SEEN   Squamous Epithelial / LPF 11-20 0 - 5   Trans Epithel, UA <1    Mucus PRESENT   POC occult blood, ED  Result Value Ref Range   Fecal Occult Bld POSITIVE (A) NEGATIVE  Troponin I (High Sensitivity)  Result Value Ref Range   Troponin I (High Sensitivity) 8 <18 ng/L   CT Abdomen Pelvis W Contrast  Result Date: 01/02/2022 CLINICAL DATA:  Nausea and vomiting.  Recent colonoscopy. EXAM: CT ABDOMEN AND PELVIS WITH CONTRAST TECHNIQUE: Multidetector CT imaging of the abdomen and pelvis was performed using the standard protocol following bolus administration of intravenous contrast.  RADIATION DOSE REDUCTION: This exam was performed according to the departmental dose-optimization program which includes automated exposure control, adjustment of the mA and/or kV according to patient size and/or use of iterative reconstruction technique. CONTRAST:  OMNIPAQUE IOHEXOL 300 MG/ML  SOLN COMPARISON:  04/04/2018, 10/22/2018. FINDINGS: Lower chest: There is a large hiatal hernia with atelectasis at the left lung base. Hepatobiliary: The previously described hypodense mass in the left lobe of the liver is not visualized on this exam. Mild fatty infiltration of the liver. A stable nodule is noted along the posterior aspect of the liver measuring 7 mm. No gallstones, gallbladder wall thickening, or biliary dilatation. Pancreas: There is a 4 mm hypodensity in the body of the pancreas. No pancreatic ductal dilatation. No surrounding inflammatory changes. Spleen: Normal in size without focal abnormality. Adrenals/Urinary Tract: No adrenal nodule or mass. The kidneys enhance symmetrically. No renal calculus bilaterally. There is mild fullness of the renal collecting systems bilaterally to the level of the pelvis with no obstructing calculus visualized. The bladder is unremarkable. Stomach/Bowel: There is a large hiatal hernia. Thickening of the walls of the gastric antrum is noted and there is enhancement of the gastric mucosa. No bowel obstruction, free air, or pneumatosis. Scattered diverticula are present along the colon. There is marked colonic wall thickening with surrounding fat stranding at the rectosigmoid colon compatible with diverticulitis. Normal appendix is seen in the right lower quadrant.  Vascular/Lymphatic: Aortic atherosclerosis. No enlarged abdominal or pelvic lymph nodes. Reproductive: Uterus and bilateral adnexa are unremarkable. Other: No abdominopelvic ascites. Musculoskeletal: Degenerative changes in the thoracolumbar spine. No acute osseous abnormality. IMPRESSION: 1. Colonic  diverticulosis with marked thickening of the walls of the sigmoid colon and rectum with surrounding fat stranding suggesting acute diverticulitis/proctitis. No abscess or free air. 2. Large hiatal hernia. There is mild thickening of the walls of the gastric antrum with gastric mucosal enhancement possible gastritis. 3. Prominence of the renal collecting systems bilaterally to the level of the pelvis with no evidence obstructive calculus. Findings may be associated with inflammatory changes in the pelvis. 4. Stable nodule in the posterior aspect of the liver. The previously described hypodense region in the left lobe of the liver is not visualized on this exam. 5. Stable 4 mm hypodensity in the body of the pancreas, unchanged from MRI abdomen 2020. 6. Aortic atherosclerosis. Electronically Signed   By: Thornell Sartorius M.D.   On: 01/02/2022 22:50   MM 3D SCREEN BREAST BILATERAL  Result Date: 12/06/2021 CLINICAL DATA:  Screening. EXAM: DIGITAL SCREENING BILATERAL MAMMOGRAM WITH TOMOSYNTHESIS AND CAD TECHNIQUE: Bilateral screening digital craniocaudal and mediolateral oblique mammograms were obtained. Bilateral screening digital breast tomosynthesis was performed. The images were evaluated with computer-aided detection. COMPARISON:  Previous exam(s). ACR Breast Density Category b: There are scattered areas of fibroglandular density. FINDINGS: There are no findings suspicious for malignancy. IMPRESSION: No mammographic evidence of malignancy. A result letter of this screening mammogram will be mailed directly to the patient. RECOMMENDATION: Screening mammogram in one year. (Code:SM-B-01Y) BI-RADS CATEGORY  1: Negative. Electronically Signed   By: Gerome Sam III M.D.   On: 12/06/2021 14:56     EKG EKG Interpretation  Date/Time:  Tuesday January 02 2022 19:19:54 EDT Ventricular Rate:  110 PR Interval:  132 QRS Duration: 68 QT Interval:  320 QTC Calculation: 433 R Axis:   77 Text Interpretation: Sinus  tachycardia with frequent Premature ventricular complexes Nonspecific ST abnormality Confirmed by Cathren Laine ([id number]) on 01/02/2022 7:42:03 PM  Radiology CT Abdomen Pelvis W Contrast  Result Date: 01/02/2022 CLINICAL DATA:  Nausea and vomiting.  Recent colonoscopy. EXAM: CT ABDOMEN AND PELVIS WITH CONTRAST TECHNIQUE: Multidetector CT imaging of the abdomen and pelvis was performed using the standard protocol following bolus administration of intravenous contrast. RADIATION DOSE REDUCTION: This exam was performed according to the departmental dose-optimization program which includes automated exposure control, adjustment of the mA and/or kV according to patient size and/or use of iterative reconstruction technique. CONTRAST:  OMNIPAQUE IOHEXOL 300 MG/ML  SOLN COMPARISON:  04/04/2018, 10/22/2018. FINDINGS: Lower chest: There is a large hiatal hernia with atelectasis at the left lung base. Hepatobiliary: The previously described hypodense mass in the left lobe of the liver is not visualized on this exam. Mild fatty infiltration of the liver. A stable nodule is noted along the posterior aspect of the liver measuring 7 mm. No gallstones, gallbladder wall thickening, or biliary dilatation. Pancreas: There is a 4 mm hypodensity in the body of the pancreas. No pancreatic ductal dilatation. No surrounding inflammatory changes. Spleen: Normal in size without focal abnormality. Adrenals/Urinary Tract: No adrenal nodule or mass. The kidneys enhance symmetrically. No renal calculus bilaterally. There is mild fullness of the renal collecting systems bilaterally to the level of the pelvis with no obstructing calculus visualized. The bladder is unremarkable. Stomach/Bowel: There is a large hiatal hernia. Thickening of the walls of the gastric antrum is noted and there is  enhancement of the gastric mucosa. No bowel obstruction, free air, or pneumatosis. Scattered diverticula are present along the colon. There is marked  colonic wall thickening with surrounding fat stranding at the rectosigmoid colon compatible with diverticulitis. Normal appendix is seen in the right lower quadrant. Vascular/Lymphatic: Aortic atherosclerosis. No enlarged abdominal or pelvic lymph nodes. Reproductive: Uterus and bilateral adnexa are unremarkable. Other: No abdominopelvic ascites. Musculoskeletal: Degenerative changes in the thoracolumbar spine. No acute osseous abnormality. IMPRESSION: 1. Colonic diverticulosis with marked thickening of the walls of the sigmoid colon and rectum with surrounding fat stranding suggesting acute diverticulitis/proctitis. No abscess or free air. 2. Large hiatal hernia. There is mild thickening of the walls of the gastric antrum with gastric mucosal enhancement possible gastritis. 3. Prominence of the renal collecting systems bilaterally to the level of the pelvis with no evidence obstructive calculus. Findings may be associated with inflammatory changes in the pelvis. 4. Stable nodule in the posterior aspect of the liver. The previously described hypodense region in the left lobe of the liver is not visualized on this exam. 5. Stable 4 mm hypodensity in the body of the pancreas, unchanged from MRI abdomen 2020. 6. Aortic atherosclerosis. Electronically Signed   By: Thornell Sartorius M.D.   On: 01/02/2022 22:50    Procedures Procedures    Medications Ordered in ED Medications  sodium chloride 0.9 % bolus 1,000 mL (0 mLs Intravenous Stopped 01/02/22 2143)  ondansetron (ZOFRAN) injection 4 mg (4 mg Intravenous Given 01/02/22 2057)  sodium chloride 0.9 % bolus 1,000 mL (1,000 mLs Intravenous New Bag/Given 01/02/22 2206)  pantoprazole (PROTONIX) injection 40 mg (40 mg Intravenous Given 01/02/22 2206)  iohexol (OMNIPAQUE) 300 MG/ML solution 100 mL (100 mLs Intravenous Contrast Given 01/02/22 2218)    ED Course/ Medical Decision Making/ A&P                           Medical Decision Making Problems Addressed: Acute  superficial gastritis with hemorrhage: acute illness or injury with systemic symptoms Dehydration: acute illness or injury with systemic symptoms Diverticulitis of colon: acute illness or injury with systemic symptoms Hiatal hernia: chronic illness or injury Nausea vomiting and diarrhea: acute illness or injury with systemic symptoms  Amount and/or Complexity of Data Reviewed Independent Historian:     Details: Family, hx External Data Reviewed: notes. Labs: ordered. Decision-making details documented in ED Course. Radiology: ordered and independent interpretation performed. Decision-making details documented in ED Course. ECG/medicine tests: ordered and independent interpretation performed. Decision-making details documented in ED Course. Discussion of management or test interpretation with external provider(s): hospitalists  Risk Prescription drug management. Decision regarding hospitalization.   Iv ns. Continuous pulse ox and cardiac monitoring. Labs ordered/sent. Imaging ordered.   Reviewed nursing notes and prior charts for additional history. External reports reviewed. Additional history from: family.   Cardiac monitor: sinus rhythm, rate 72.  Labs reviewed/interpreted by me - trop normal. No chest pain currently, episode was last evening. Wbc normal. Hgb c/w prior. Hemoccult is positive. Bun/cr mildly increased from prior.   Protonix iv. Iv ns bolus.   CT reviewed/interpreted by me - ?diverticulitis. Large HH.  Zosyn iv.   Given coffee ground emesis, weakness, heme pos stools, nv, diverticultis on ct - will plan to admit for iv fluids, abx, acid blocker therapy, expectant management.   Hospitalists consulted for admission.           Final Clinical Impression(s) / ED Diagnoses Final diagnoses:  None  Rx / DC Orders ED Discharge Orders     None         Cathren Laine, MD 01/02/22 2324

## 2022-01-02 NOTE — ED Triage Notes (Signed)
N/v/d for a couple days. Had a colonoscopy on Tuesday. Says emesis is dark. C/o lack of energy as well.

## 2022-01-03 ENCOUNTER — Encounter (HOSPITAL_COMMUNITY): Payer: Self-pay | Admitting: Gastroenterology

## 2022-01-03 ENCOUNTER — Telehealth (INDEPENDENT_AMBULATORY_CARE_PROVIDER_SITE_OTHER): Payer: Self-pay

## 2022-01-03 DIAGNOSIS — K922 Gastrointestinal hemorrhage, unspecified: Secondary | ICD-10-CM | POA: Diagnosis present

## 2022-01-03 DIAGNOSIS — I10 Essential (primary) hypertension: Secondary | ICD-10-CM | POA: Diagnosis present

## 2022-01-03 DIAGNOSIS — K649 Unspecified hemorrhoids: Secondary | ICD-10-CM | POA: Diagnosis present

## 2022-01-03 DIAGNOSIS — K259 Gastric ulcer, unspecified as acute or chronic, without hemorrhage or perforation: Secondary | ICD-10-CM | POA: Diagnosis not present

## 2022-01-03 DIAGNOSIS — K5733 Diverticulitis of large intestine without perforation or abscess with bleeding: Secondary | ICD-10-CM | POA: Diagnosis present

## 2022-01-03 DIAGNOSIS — R112 Nausea with vomiting, unspecified: Secondary | ICD-10-CM

## 2022-01-03 DIAGNOSIS — K5792 Diverticulitis of intestine, part unspecified, without perforation or abscess without bleeding: Secondary | ICD-10-CM

## 2022-01-03 DIAGNOSIS — Z7902 Long term (current) use of antithrombotics/antiplatelets: Secondary | ICD-10-CM | POA: Diagnosis not present

## 2022-01-03 DIAGNOSIS — K449 Diaphragmatic hernia without obstruction or gangrene: Secondary | ICD-10-CM | POA: Diagnosis present

## 2022-01-03 DIAGNOSIS — D62 Acute posthemorrhagic anemia: Secondary | ICD-10-CM | POA: Diagnosis present

## 2022-01-03 DIAGNOSIS — Z681 Body mass index (BMI) 19 or less, adult: Secondary | ICD-10-CM | POA: Diagnosis not present

## 2022-01-03 DIAGNOSIS — E860 Dehydration: Secondary | ICD-10-CM | POA: Diagnosis present

## 2022-01-03 DIAGNOSIS — R531 Weakness: Secondary | ICD-10-CM

## 2022-01-03 DIAGNOSIS — K2211 Ulcer of esophagus with bleeding: Secondary | ICD-10-CM | POA: Diagnosis present

## 2022-01-03 DIAGNOSIS — E876 Hypokalemia: Secondary | ICD-10-CM | POA: Diagnosis present

## 2022-01-03 DIAGNOSIS — E782 Mixed hyperlipidemia: Secondary | ICD-10-CM

## 2022-01-03 DIAGNOSIS — R1084 Generalized abdominal pain: Secondary | ICD-10-CM

## 2022-01-03 DIAGNOSIS — H547 Unspecified visual loss: Secondary | ICD-10-CM | POA: Diagnosis present

## 2022-01-03 DIAGNOSIS — Z8249 Family history of ischemic heart disease and other diseases of the circulatory system: Secondary | ICD-10-CM | POA: Diagnosis not present

## 2022-01-03 DIAGNOSIS — K2289 Other specified disease of esophagus: Secondary | ICD-10-CM | POA: Diagnosis not present

## 2022-01-03 DIAGNOSIS — R197 Diarrhea, unspecified: Secondary | ICD-10-CM

## 2022-01-03 DIAGNOSIS — F32A Depression, unspecified: Secondary | ICD-10-CM | POA: Diagnosis present

## 2022-01-03 DIAGNOSIS — R109 Unspecified abdominal pain: Secondary | ICD-10-CM

## 2022-01-03 DIAGNOSIS — R630 Anorexia: Secondary | ICD-10-CM | POA: Diagnosis present

## 2022-01-03 DIAGNOSIS — R634 Abnormal weight loss: Secondary | ICD-10-CM | POA: Diagnosis present

## 2022-01-03 DIAGNOSIS — K254 Chronic or unspecified gastric ulcer with hemorrhage: Secondary | ICD-10-CM | POA: Diagnosis present

## 2022-01-03 DIAGNOSIS — N3281 Overactive bladder: Secondary | ICD-10-CM

## 2022-01-03 DIAGNOSIS — K921 Melena: Secondary | ICD-10-CM | POA: Diagnosis not present

## 2022-01-03 DIAGNOSIS — Z79899 Other long term (current) drug therapy: Secondary | ICD-10-CM | POA: Diagnosis not present

## 2022-01-03 DIAGNOSIS — G4700 Insomnia, unspecified: Secondary | ICD-10-CM | POA: Diagnosis present

## 2022-01-03 DIAGNOSIS — Z8673 Personal history of transient ischemic attack (TIA), and cerebral infarction without residual deficits: Secondary | ICD-10-CM | POA: Diagnosis not present

## 2022-01-03 DIAGNOSIS — I700 Atherosclerosis of aorta: Secondary | ICD-10-CM | POA: Diagnosis present

## 2022-01-03 DIAGNOSIS — K920 Hematemesis: Secondary | ICD-10-CM | POA: Diagnosis not present

## 2022-01-03 DIAGNOSIS — Z823 Family history of stroke: Secondary | ICD-10-CM | POA: Diagnosis not present

## 2022-01-03 DIAGNOSIS — K529 Noninfective gastroenteritis and colitis, unspecified: Secondary | ICD-10-CM | POA: Diagnosis present

## 2022-01-03 DIAGNOSIS — T182XXA Foreign body in stomach, initial encounter: Secondary | ICD-10-CM | POA: Diagnosis not present

## 2022-01-03 LAB — COMPREHENSIVE METABOLIC PANEL
ALT: 21 U/L (ref 0–44)
AST: 24 U/L (ref 15–41)
Albumin: 3.2 g/dL — ABNORMAL LOW (ref 3.5–5.0)
Alkaline Phosphatase: 46 U/L (ref 38–126)
Anion gap: 2 — ABNORMAL LOW (ref 5–15)
BUN: 25 mg/dL — ABNORMAL HIGH (ref 8–23)
CO2: 27 mmol/L (ref 22–32)
Calcium: 7.8 mg/dL — ABNORMAL LOW (ref 8.9–10.3)
Chloride: 107 mmol/L (ref 98–111)
Creatinine, Ser: 0.74 mg/dL (ref 0.44–1.00)
GFR, Estimated: >60 mL/min (ref >60)
Glucose, Bld: 107 mg/dL — ABNORMAL HIGH (ref 70–99)
Potassium: 4.1 mmol/L (ref 3.5–5.1)
Sodium: 136 mmol/L (ref 135–145)
Total Bilirubin: 0.9 mg/dL (ref 0.3–1.2)
Total Protein: 5.8 g/dL — ABNORMAL LOW (ref 6.5–8.1)

## 2022-01-03 LAB — CBC
HCT: 29.5 % — ABNORMAL LOW (ref 36.0–46.0)
Hemoglobin: 9.5 g/dL — ABNORMAL LOW (ref 12.0–15.0)
MCH: 30.4 pg (ref 26.0–34.0)
MCHC: 32.2 g/dL (ref 30.0–36.0)
MCV: 94.2 fL (ref 80.0–100.0)
Platelets: 215 10*3/uL (ref 150–400)
RBC: 3.13 MIL/uL — ABNORMAL LOW (ref 3.87–5.11)
RDW: 14.3 % (ref 11.5–15.5)
WBC: 5.7 10*3/uL (ref 4.0–10.5)
nRBC: 0 % (ref 0.0–0.2)

## 2022-01-03 LAB — APTT: aPTT: 28 seconds (ref 24–36)

## 2022-01-03 LAB — MAGNESIUM: Magnesium: 1.9 mg/dL (ref 1.7–2.4)

## 2022-01-03 LAB — PHOSPHORUS: Phosphorus: 2.5 mg/dL (ref 2.5–4.6)

## 2022-01-03 MED ORDER — ONDANSETRON HCL 4 MG/2ML IJ SOLN: 4.0000 mg | Freq: Four times a day (QID) | INTRAMUSCULAR | Status: DC | PRN

## 2022-01-03 MED ORDER — ENSURE ENLIVE PO LIQD
237.0000 mL | Freq: Two times a day (BID) | ORAL | Status: DC
  Administered 2022-01-03 – 2022-01-05 (×2): 237 mL via ORAL

## 2022-01-03 MED ORDER — CALCIUM GLUCONATE-NACL 1-0.675 GM/50ML-% IV SOLN
1.0000 g | Freq: Once | INTRAVENOUS | Status: AC
  Administered 2022-01-03: 1000 mg via INTRAVENOUS
  Filled 2022-01-03: qty 50

## 2022-01-03 MED ORDER — PANTOPRAZOLE SODIUM 40 MG IV SOLR
40.0000 mg | Freq: Two times a day (BID) | INTRAVENOUS | Status: DC
  Administered 2022-01-03 – 2022-01-05 (×4): 40 mg via INTRAVENOUS
  Filled 2022-01-03 (×5): qty 10

## 2022-01-03 MED ORDER — SODIUM CHLORIDE 0.9 % IV SOLN: INTRAVENOUS | Status: AC

## 2022-01-03 MED ORDER — MELATONIN 3 MG PO TABS
6.0000 mg | ORAL_TABLET | Freq: Once | ORAL | Status: AC
  Administered 2022-01-03: 6 mg via ORAL
  Filled 2022-01-03: qty 2

## 2022-01-03 MED ORDER — ACETAMINOPHEN 325 MG PO TABS
650.0000 mg | ORAL_TABLET | Freq: Four times a day (QID) | ORAL | Status: DC | PRN
  Administered 2022-01-05: 650 mg via ORAL
  Filled 2022-01-03: qty 2

## 2022-01-03 MED ORDER — ONDANSETRON HCL 4 MG PO TABS: 4.0000 mg | ORAL_TABLET | Freq: Four times a day (QID) | ORAL | Status: DC | PRN

## 2022-01-03 MED ORDER — ENOXAPARIN SODIUM 30 MG/0.3ML IJ SOSY
30.0000 mg | PREFILLED_SYRINGE | INTRAMUSCULAR | Status: DC
  Administered 2022-01-03: 30 mg via SUBCUTANEOUS
  Filled 2022-01-03: qty 0.3

## 2022-01-03 MED ORDER — ACETAMINOPHEN 650 MG RE SUPP: 650.0000 mg | Freq: Four times a day (QID) | RECTAL | Status: DC | PRN

## 2022-01-03 MED ORDER — ALPRAZOLAM 0.5 MG PO TABS
0.5000 mg | ORAL_TABLET | Freq: Three times a day (TID) | ORAL | Status: DC
  Administered 2022-01-03 – 2022-01-05 (×5): 0.5 mg via ORAL
  Filled 2022-01-03 (×5): qty 1

## 2022-01-03 MED ORDER — PIPERACILLIN-TAZOBACTAM 3.375 G IVPB
3.3750 g | Freq: Three times a day (TID) | INTRAVENOUS | Status: DC
  Administered 2022-01-03 – 2022-01-05 (×6): 3.375 g via INTRAVENOUS
  Filled 2022-01-03 (×6): qty 50

## 2022-01-03 MED ORDER — MORPHINE SULFATE (PF) 2 MG/ML IV SOLN: 2.0000 mg | INTRAVENOUS | Status: DC | PRN

## 2022-01-03 NOTE — Progress Notes (Signed)
Pharmacy Antibiotic Note  Sarah Page is a 81 y.o. female admitted on 01/02/2022 with GI symptoms and possible intraabdominal infection. Pharmacy has been consulted for Zosyn dosing. First dose given in ER.  Plan: Zosyn 3.375g EI q8h  Height: 5\' 4"  (162.6 cm) Weight: 48.9 kg (107 lb 12.9 oz) IBW/kg (Calculated) : 54.7  Temp (24hrs), Avg:97.7 F (36.5 C), Min:96.9 F (36.1 C), Max:98.1 F (36.7 C)  Recent Labs  Lab 01/02/22 1932  WBC 8.8  CREATININE 0.95    Estimated Creatinine Clearance: 37.7 mL/min (by C-G formula based on SCr of 0.95 mg/dL).    No Known Allergies    03/04/22, PharmD, BCPS, [facility] Clinical Pharmacist Please check AMION for all [facility] Pharmacy numbers 01/03/2022

## 2022-01-03 NOTE — Telephone Encounter (Signed)
Message sent to nurse line at the office at 8 am on 01/03/2022.I spoke with the patient daughter and she says she had called after 5 pm on 01/02/2022 and left a message in hopes to get the on call doctor number. Per daughter they took her mother to [facility] and that is where she is currently.   Patient daughter called on 01/02/2022 after hours stating her mother had a tcs on 12/26/2021 and was having some vomiting of dark liquids and had some issues with diarrhea.

## 2022-01-03 NOTE — Consult Note (Signed)
Referring Provider: No ref. provider found Primary Care Physician:  Sheela Stack Primary Gastroenterologist:  Lanier Prude   Date of Admission: 01/02/22 Date of Consultation: 01/03/22  Reason for Consultation:  coffee ground emesis   HPI:  Sarah Page is a 81 y.o. year old female  anemia, arthritis, stroke, visual impairment and diverticulitis (2019) who presented to the ED yesterday with 2 days of coffee ground emesis, nausea and diarrhea, s/p colonoscopy on 12/26/21. Also with some chest heaviness on Monday evening, stools loose to watery but non bloody, though does note darker than normal stools.   ED Course: FOBT positive BUN 31, calcium 7.8 Hgb 11.8 WBC 5.7 CT A/P with contrast: (GI pertinent findings) -Colonic diverticulosis with marked thickening of the walls of the sigmoid colon and rectum with surrounding fat stranding suggesting acute diverticulitis/proctitis. No abscess or free air. -Large hiatal hernia. There is mild thickening of the walls of the gastric antrum with gastric mucosal enhancement possible gastritis -Stable nodule in the posterior aspect of the liver. The previously described hypodense region in the left lobe of the liver is not visualized on this exam. -Stable 4 mm hypodensity in the body of the pancreas, unchanged from MRI abdomen 2020. -C diff and GI pathogen panel are in process.  Started on zosyn Q8H and PPI A12H  Consult: Patient well known to our GI clinic, has been seen multiple times for diarrhea, suspected secondary to IBS, started on low FODMAP diet with some improvement, though patient continued with concerns for why she was having loose stools and weight loss, therefore colonoscopy done on 9/5 with hemorrhoids, diverticulosis, tortuous colon.   Daughter states patient did well after her colonoscopy in regards to her appetite and eating though remained somewhat fatigued. They report that she really started to feel bad on Sunday  with nausea, having 2 episodes of diarrhea per day which is baseline for her. Symptoms worsened yesterday which is when she proceeded to the ED. She denies abdominal pain. No fevers or chills. She notes that she had black stools on Tuesday as well as coffee ground emesis. She denies any hematemesis. Daughter notes that patient had some chest pressure on Monday evening. She denies any GERD symptoms. She denies any NSAID use. She notes one BM this morning that was loose but not watery, no presence of melena or BRB.   Patient has had poor appetite and intake on outpatient basis. She has lost approximatey 25 pounds at this point. She denies early satiety or post prandial abdominal pain but notes that she just does not have an appetite.    Last EGD: never Last Colonoscopy:12/26/21, as above.  Past Medical History:  Diagnosis Date   Anemia    Arthritis    Gait disturbance    Rotator cuff tear    DR [facility]   Stroke [facility])    Vision abnormalities     Past Surgical History:  Procedure Laterality Date   BIOPSY  12/26/2021   Procedure: BIOPSY;  Surgeon: Dolores Frame, MD;  Location: [facility];  Service: Gastroenterology;;   COLONOSCOPY WITH PROPOFOL N/A 12/26/2021   Procedure: COLONOSCOPY WITH PROPOFOL;  Surgeon: Dolores Frame, MD;  Location: [facility];  Service: Gastroenterology;  Laterality: N/A;  645  ASA 3   ORIF CLAVICULAR FRACTURE Left     Prior to Admission medications   Medication Sig Start Date End Date Taking? Authorizing Provider  acetaminophen (TYLENOL) 650 MG CR tablet Take 1,300 mg by mouth every  8 (eight) hours.   Yes [provider]  amLODipine-olmesartan (AZOR) 5-20 MG tablet Take 1 tablet by mouth daily. 05/25/21  Yes [provider]  calcium carbonate (OSCAL) 1500 (600 Ca) MG TABS tablet Take 600 mg of elemental calcium by mouth in the morning and at bedtime.   Yes [provider]  Cholecalciferol (VITAMIN D) 50 MCG (2000  UT) CAPS Take 2,000 Units by mouth daily.   Yes [provider]  clopidogrel (PLAVIX) 75 MG tablet Take 75 mg by mouth daily.  03/10/18  Yes [provider]  colestipol (COLESTID) 1 g tablet Take 2 tablets (2 g total) by mouth daily. Take 4 hours apart from other medicines 12/27/21  Yes Marguerita Merles, Reuel Boom, MD  Cyanocobalamin (VITAMIN B 12 PO) Take 1,000 mg by mouth daily.   Yes [provider]  ezetimibe (ZETIA) 10 MG tablet Take 10 mg by mouth daily.   Yes [provider]  hydroxychloroquine (PLAQUENIL) 200 MG tablet Take 200 mg by mouth daily. 05/25/21  Yes [provider]  leflunomide (ARAVA) 10 MG tablet Take 10 mg by mouth daily. 05/25/21  Yes [provider]  Melatonin 10 MG TABS Take 10 mg by mouth at bedtime.   Yes [provider]  polyvinyl alcohol (LIQUIFILM TEARS) 1.4 % ophthalmic solution Place 1 drop into both eyes as needed for dry eyes.   Yes [provider]  Probiotic Product (ALIGN PO) Take 1 capsule by mouth daily.   Yes [provider]  sertraline (ZOLOFT) 25 MG tablet Take 25 mg by mouth daily.   Yes [provider]  solifenacin (VESICARE) 5 MG tablet Take 5 mg by mouth daily.   Yes [provider]  Vibegron (GEMTESA) 75 MG TABS Take 75 mg by mouth daily at 6 (six) AM.   Yes [provider]    Current Facility-Administered Medications  Medication Dose Route Frequency Provider Last Rate Last Admin   0.9 %  sodium chloride infusion   Intravenous Continuous Adefeso, Oladapo, DO 50 mL/hr at 01/03/22 0334 New Bag at 01/03/22 0334   acetaminophen (TYLENOL) tablet 650 mg  650 mg Oral Q6H PRN Adefeso, Oladapo, DO       Or   acetaminophen (TYLENOL) suppository 650 mg  650 mg Rectal Q6H PRN Adefeso, Oladapo, DO       calcium gluconate 1 g/ 50 mL sodium chloride IVPB  1 g Intravenous Once Shahmehdi, Seyed A, MD       feeding supplement (ENSURE ENLIVE / ENSURE PLUS) liquid 237 mL   237 mL Oral BID BM Adefeso, Oladapo, DO       morphine (PF) 2 MG/ML injection 2 mg  2 mg Intravenous Q4H PRN Adefeso, Oladapo, DO       ondansetron (ZOFRAN) tablet 4 mg  4 mg Oral Q6H PRN Adefeso, Oladapo, DO       Or   ondansetron (ZOFRAN) injection 4 mg  4 mg Intravenous Q6H PRN Adefeso, Oladapo, DO       pantoprazole (PROTONIX) injection 40 mg  40 mg Intravenous Q12H Adefeso, Oladapo, DO       piperacillin-tazobactam (ZOSYN) IVPB 3.375 g  3.375 g Intravenous Q8H Mosetta Anis, RPH 12.5 mL/hr at 01/03/22 0505 3.375 g at 01/03/22 0505    Allergies as of 01/02/2022   (No Known Allergies)    Family History  Problem Relation Age of Onset   Stroke Mother    Transient ischemic attack Mother    Stroke  Father    Heart attack Father    Mental illness Brother    Breast cancer Daughter 86       [name]    Social History   Socioeconomic History   Marital status: Widowed    Spouse name: Not on file   Number of children: Not on file   Years of education: Not on file   Highest education level: Not on file  Occupational History   Not on file  Tobacco Use   Smoking status: Never    Passive exposure: Never   Smokeless tobacco: Never  Vaping Use   Vaping Use: Never used  Substance and Sexual Activity   Alcohol use: Yes    Comment: occasional wine   Drug use: Never   Sexual activity: Not on file  Other Topics Concern   Not on file  Social History Narrative   Not on file   Social Determinants of Health   Financial Resource Strain: Not on file  Food Insecurity: No Food Insecurity (01/03/2022)   Hunger Vital Sign    Worried About Running Out of Food in the Last Year: Never true    Ran Out of Food in the Last Year: Never true  Transportation Needs: No Transportation Needs (01/03/2022)   PRAPARE - Administrator, Civil Service (Medical): No    Lack of Transportation (Non-Medical): No  Physical Activity: Not on file  Stress: Not on file  Social Connections: Not on  file  Intimate Partner Violence: Not At Risk (01/03/2022)   Humiliation, Afraid, Rape, and Kick questionnaire    Fear of Current or Ex-Partner: No    Emotionally Abused: No    Physically Abused: No    Sexually Abused: No    Review of Systems: Gen: Denies fever, chills, loss of appetite, change in weight or weight loss CV: Denies chest pain, heart palpitations, syncope, edema  Resp: Denies shortness of breath with rest, cough, wheezing GI: denies  hematochezia,constipation, dysphagia, odyonophagia, early satiety.+weight loss +nausea +coffee ground emesis +melena   GU : Denies urinary burning, urinary frequency, urinary incontinence.  MS: Denies joint pain,swelling, cramping Derm: Denies rash, itching, dry skin Psych: Denies depression, anxiety,confusion, or memory loss Heme: Denies bruising, bleeding, and enlarged lymph nodes.  Physical Exam: Vital signs in last 24 hours: Temp:  [96.9 F (36.1 C)-98.6 F (37 C)] 98.6 F (37 C) (09/13 0532) Pulse Rate:  [27-110] 105 (09/13 0847) Resp:  [16-20] 20 (09/13 0847) BP: (106-153)/(63-94) 115/68 (09/13 0847) SpO2:  [90 %-99 %] 96 % (09/13 0847) Weight:  [48.5 kg-48.9 kg] 48.9 kg (09/13 0051) Last BM Date : 01/02/22 General:   Alert,  Well-developed, well-nourished, pleasant and cooperative in NAD Head:  Normocephalic and atraumatic. Eyes:  Sclera clear, no icterus.   Conjunctiva pink. Ears:  Normal auditory acuity. Nose:  No deformity, discharge,  or lesions. Mouth:  No deformity or lesions, dentition normal. Lungs:  Clear throughout to auscultation.   No wheezes, crackles, or rhonchi. No acute distress. Heart:  Regular rate and rhythm; no murmurs, clicks, rubs,  or gallops. Abdomen:  Soft, nontender and nondistended. No masses, hepatosplenomegaly or hernias noted. Normal bowel sounds, without guarding, and without rebound.   Rectal:  Deferred until time of colonoscopy.   Msk:  Symmetrical without gross deformities. Normal  posture. Pulses:  Normal pulses noted. Extremities:  Without clubbing or edema. Neurologic:  Alert and  oriented x4;  grossly normal neurologically. Skin:  Intact without significant lesions or rashes. Psych:  Alert and cooperative. Normal mood and affect.  Intake/Output from previous day: 09/12 0701 - 09/13 0700 In: 2081.4 [I.V.:31.4; IV Piggyback:2050] Out: -  Intake/Output this shift: Total I/O In: 240 [P.O.:240] Out: -   Lab Results: Recent Labs    01/02/22 1932 01/03/22 0409  WBC 8.8 5.7  HGB 11.8* 9.5*  HCT 36.7 29.5*  PLT 316 215   BMET Recent Labs    01/02/22 1932 01/03/22 0409  NA 134* 136  K 4.3 4.1  CL 93* 107  CO2 29 27  GLUCOSE 131* 107*  BUN 31* 25*  CREATININE 0.95 0.74  CALCIUM 9.2 7.8*   LFT Recent Labs    01/02/22 1932 01/03/22 0409  PROT 7.7 5.8*  ALBUMIN 4.2 3.2*  AST 36 24  ALT 27 21  ALKPHOS 57 46  BILITOT 0.9 0.9   PT/INR No results for input(s): "LABPROT", "INR" in the last 72 hours. Hepatitis Panel No results for input(s): "HEPBSAG", "HCVAB", "HEPAIGM", "HEPBIGM" in the last 72 hours. C-Diff No results for input(s): "CDIFFTOX" in the last 72 hours.  Studies/Results: CT Abdomen Pelvis W Contrast  Result Date: 01/02/2022 CLINICAL DATA:  Nausea and vomiting.  Recent colonoscopy. EXAM: CT ABDOMEN AND PELVIS WITH CONTRAST TECHNIQUE: Multidetector CT imaging of the abdomen and pelvis was performed using the standard protocol following bolus administration of intravenous contrast. RADIATION DOSE REDUCTION: This exam was performed according to the departmental dose-optimization program which includes automated exposure control, adjustment of the mA and/or kV according to patient size and/or use of iterative reconstruction technique. CONTRAST:  OMNIPAQUE IOHEXOL 300 MG/ML  SOLN COMPARISON:  04/04/2018, 10/22/2018. FINDINGS: Lower chest: There is a large hiatal hernia with atelectasis at the left lung base. Hepatobiliary: The  previously described hypodense mass in the left lobe of the liver is not visualized on this exam. Mild fatty infiltration of the liver. A stable nodule is noted along the posterior aspect of the liver measuring 7 mm. No gallstones, gallbladder wall thickening, or biliary dilatation. Pancreas: There is a 4 mm hypodensity in the body of the pancreas. No pancreatic ductal dilatation. No surrounding inflammatory changes. Spleen: Normal in size without focal abnormality. Adrenals/Urinary Tract: No adrenal nodule or mass. The kidneys enhance symmetrically. No renal calculus bilaterally. There is mild fullness of the renal collecting systems bilaterally to the level of the pelvis with no obstructing calculus visualized. The bladder is unremarkable. Stomach/Bowel: There is a large hiatal hernia. Thickening of the walls of the gastric antrum is noted and there is enhancement of the gastric mucosa. No bowel obstruction, free air, or pneumatosis. Scattered diverticula are present along the colon. There is marked colonic wall thickening with surrounding fat stranding at the rectosigmoid colon compatible with diverticulitis. Normal appendix is seen in the right lower quadrant. Vascular/Lymphatic: Aortic atherosclerosis. No enlarged abdominal or pelvic lymph nodes. Reproductive: Uterus and bilateral adnexa are unremarkable. Other: No abdominopelvic ascites. Musculoskeletal: Degenerative changes in the thoracolumbar spine. No acute osseous abnormality. IMPRESSION: 1. Colonic diverticulosis with marked thickening of the walls of the sigmoid colon and rectum with surrounding fat stranding suggesting acute diverticulitis/proctitis. No abscess or free air. 2. Large hiatal hernia. There is mild thickening of the walls of the gastric antrum with gastric mucosal enhancement possible gastritis. 3. Prominence of the renal collecting systems bilaterally to the level of the pelvis with no evidence obstructive calculus. Findings may be  associated with inflammatory changes in the pelvis. 4. Stable nodule in the posterior aspect of the liver. The previously  described hypodense region in the left lobe of the liver is not visualized on this exam. 5. Stable 4 mm hypodensity in the body of the pancreas, unchanged from MRI abdomen 2020. 6. Aortic atherosclerosis. Electronically Signed   By: Thornell Sartorius M.D.   On: 01/02/2022 22:50    Impression: Sarah Page is a 81 y.o. year old female  anemia, arthritis, stroke, visual impairment and diverticulitis (2019) who presented to the ED yesterday with 2 days of coffee ground emesis, nausea and diarrhea, s/p colonoscopy on 12/26/21. Also with some chest heaviness on Monday evening, stools loose to watery but non bloody, though does note darker than normal stools. CT A/P concerning for acute diverticulitis, GI consulted for further evaluation.  Coffee ground emesis: BUN elevated at 31. CT with findings of large hiatal hernia, thickening of walls of gastric antrum and enhancement of gastric mucosa, concerning for possible gastritis. She endorses nausea but has no other UGI symptoms. No early satiety or postprandial abdominal pain prior to acute illness, though she has had decrease in her appetite and 25 pound weight loss over the past few months. Started on PPI BID. Hgb dropped to 9.5 from 11.8, however, suspect some aspect of hemodilution in presence of IVF.   Recommend proceeding with EGD for concerns of upper GI bleed in presence of coffee ground emesis and elevated BUN.  given presence of large hiatal hernia melena and coffee ground emesis could be secondary to possible cameron ulcers,weight loss also raises suspicion for PUD, and severe gastritis, duodenitis cannot be ruled out. Indications, risks and benefits of procedure discussed in detail with patient. Patient verbalized understanding and is in agreement to proceed with EGD tomorrow as patient has had PO liquids this morning.   Acute  diverticulitis: recent Colonoscopy with diverticulosis, CT A/P with marked thickening of walls of sigmoid colon and rectum/surrounding fat stranding suggestive of acute diverticulitis, possible proctitis. No abscess. Started on zosyn q12h. History of diverticulitis in 2019.  Interestingly enough she has no abdominal pain, she has looser stools at baseline and notes no real increase in this, though C diff and GI path panel were previously ordered, these have not been collected, low suspicion for infectious etiology. Denies BRBPR, fevers or chills. Should continue with supportive care and antibiotic therapy.    Plan: Continue with PPI BID Monitor h&h, transfuse hgb <7 Continue with IV zosyn Full liquid diet, NPO at midnight  EGD tomorrow    LOS: 0 days    01/03/2022, 9:24 AM  Raijon Lindfors L. Jeanmarie Hubert, MSN, APRN, AGNP-C Adult-Gerontology Nurse Practitioner [facility]

## 2022-01-03 NOTE — Progress Notes (Signed)
PROGRESS NOTE    Patient: Sarah Page                            PCP: Sheela Stack                    DOB: 1940/12/29            DOA: 01/02/2022 WUJ:[id number]             DOS: 01/03/2022, 12:43 PM   LOS: 0 days   Date of Service: The patient was seen and examined on 01/03/2022  Subjective:   The patient was seen and examined this morning. Reporting that she has had 1 formed stool Improved abdominal pain, improved nausea vomiting    Brief Narrative:   Brittay Mogle is a 81 y.o. female with medical history significant of chronic diarrhea, history of diverticulitis, hypertension, hyperlipidemia, depression, overactive bladder who presents to the emergency department due to several days of nausea, vomiting and diarrhea.  She had colonoscopy on 9/5 with Dr. Levon Hedger which showed: - Hemorrhoids found on perianal exam. - Diverticulosis in the entire examined colon. - The entire examined colon is normal. Biopsied. - Tortuous colon. - The distal rectum and anal verge are normal on retroflexion view.   Patient endorsed about 6 months of chronic diarrhea which was thought to be related to diet, so, patient has been selective over the last couple of months on food eaten and thereby avoiding foods that provoke the diarrhea. She complained of having coffee-ground emesis at home and has noted black stool within the last 2 days, she developed chest heaviness last evening, this was associated with abdominal cramping/discomfort.  She has also had some diarrhea which was loose to watery.  She denied eating out, fever, chills, having any sick contacts.   ED Course:  Hemodynamically stable, work-up in the ED showed normocytic anemia, BMP showed sodium 134, potassium 4.3, chloride 93, CO2 29, glucose 131, BUN 31, creatinine 0.95, liver enzymes were normal.  Troponin x1 was negative, FOBT was positive, urinalysis was positive for large leukocytes.  CT abdomen and pelvis with  contrast showed: -Reviewed: Diverticulitis-findings suggesting of acute diverticulitis/proctitis -Large hiatal hernia - Stable nodule posterior liver, Stable 4 mm hypodensity in the body of the pancreas, unchanged from MRI abdomen 2020. 6. Aortic atherosclerosis.   IV Zofran was given, Protonix was given and patient was started on empiric IV Zosyn due to presumed acute diverticulitis/proctitis, IV hydration was provided.     Assessment/Plan Present on Admission:  GI bleed  Diarrhea  Acute diverticulitis  Dehydration   Principal Problem:   Acute diverticulitis Active Problems:   Dehydration   Diarrhea   GI bleed   Nausea & vomiting   Generalized weakness   Essential hypertension   Mixed hyperlipidemia   Overactive bladder   Insomnia   Abdominal pain   Nausea, vomiting, abdominal pain and diarrhea in the setting of acute diverticulitis/proctitis -Continue gentle IV fluid hydration, IV antibiotics -As needed Zofran, morphine, -Clear liquid diet for now -N.p.o. after midnight -GI consult for possible GI work-up including EGD on 01/04/22     Generalized weakness in the setting of above -Fall precaution, PT/OT Protein supplement to be provided   GI bleed H/H= 11.8/36.7, this was 11.4/35.3 on 12/21/2021 Hemoccult was positive Continue IV Protonix 40 twice daily Gastroenterologist  will be consulted -n.p.o. after midnight for possible EGD 01/04/2022   Dehydration Continue  IV hydration   Essential hypertension (uncontrolled) Continue Azor   Mixed hyperlipidemia Continue colestipol and Zetia   Overactive bladder Continue Myrbetriq   Insomnia Continue melatonin   Other home meds: Os-Cal, vitamin D  (Formed stool, discontinue stools for C. difficile, and contact precautions)  Assessment & Plan:   Principal Problem:   Acute diverticulitis Active Problems:   Dehydration   Diarrhea   GI bleed   Nausea & vomiting   Generalized weakness   Essential  hypertension   Mixed hyperlipidemia   Overactive bladder   Insomnia   Abdominal pain  ----------------------------------------------------------------------------------------------------------------------------------------------- Nutritional status:  The patient's BMI is: Body mass index is 18.5 kg/m. I agree with the assessment and plan as outlined below: Nutrition Status:       --------------------------------------------------------------------------------------------------------------------------------------------  DVT prophylaxis:  SCDs Start: 01/03/22 0255   Code Status:   Code Status: Full Code  Family Communication: Daughter present at bedside updated The above findings and plan of care has been discussed with patient (and family)  in detail,  they expressed understanding and agreement of above. -Advance care planning has been discussed.   Admission status:   Status is: Inpatient Remains inpatient appropriate because: Needing IV antibiotics for diverticulitis, proctitis, needing GI work-up     Procedures:   No admission procedures for hospital encounter.   Antimicrobials:  Anti-infectives (From admission, onward)    Start     Dose/Rate Route Frequency Ordered Stop   01/03/22 0600  piperacillin-tazobactam (ZOSYN) IVPB 3.375 g        3.375 g 12.5 mL/hr over 240 Minutes Intravenous Every 8 hours 01/03/22 0438     01/02/22 2330  piperacillin-tazobactam (ZOSYN) IVPB 3.375 g        3.375 g 100 mL/hr over 30 Minutes Intravenous  Once 01/02/22 2323 01/03/22 0018        Medication:   feeding supplement  237 mL Oral BID BM   pantoprazole (PROTONIX) IV  40 mg Intravenous Q12H    acetaminophen **OR** acetaminophen, morphine injection, ondansetron **OR** ondansetron (ZOFRAN) IV   Objective:   Vitals:   01/03/22 0100 01/03/22 0532 01/03/22 0847 01/03/22 1211  BP:  111/70 115/68 130/87  Pulse:  73 (!) 105 93  Resp:  Temp:  98.6 F (37 C)  97.8  F (36.6 C)  TempSrc: Oral Oral    SpO2:  94% 96% 95%  Weight:      Height:        Intake/Output Summary (Last 24 hours) at 01/03/2022 1243 Last data filed at 01/03/2022 0900 Gross per 24 hour  Intake 2501.43 ml  Output --  Net 2501.43 ml   Filed Weights   01/02/22 1914 01/03/22 0051  Weight: 48.5 kg 48.9 kg     Examination:   Physical Exam  Constitution:  Alert, cooperative, no distress,  Appears calm and comfortable  Psychiatric:   Normal and stable mood and affect, cognition intact,   HEENT:        Normocephalic, PERRL, otherwise with in Normal limits  Chest:         Chest symmetric Cardio vascular:  S1/S2, RRR, No murmure, No Rubs or Gallops  pulmonary: Clear to auscultation bilaterally, respirations unlabored, negative wheezes / crackles Abdomen: Soft, non-tender, non-distended, bowel sounds,no masses, no organomegaly Muscular skeletal: Limited exam - in bed, able to move all 4 extremities,   Neuro: CNII-XII intact. , normal motor and sensation, reflexes intact  Extremities: No pitting edema lower extremities, +2 pulses  Skin: Dry, warm to touch, negative for any Rashes, No open wounds Wounds: per nursing documentation   ------------------------------------------------------------------------------------------------------------------------------------------    LABs:     Latest Ref Rng & Units 01/03/2022    4:09 AM 01/02/2022    7:32 PM 12/21/2021   10:23 AM  CBC  WBC 4.0 - 10.5 K/uL 5.7  8.8  6.6   Hemoglobin 12.0 - 15.0 g/dL 9.5  85.4  62.7   Hematocrit 36.0 - 46.0 % 29.5  36.7  35.3   Platelets 150 - 400 K/uL 215  316  254       Latest Ref Rng & Units 01/03/2022    4:09 AM 01/02/2022    7:32 PM 06/05/2020    5:14 PM  CMP  Glucose 70 - 99 mg/dL 035  009  381   BUN 8 - 23 mg/dL 25  31  14    Creatinine 0.44 - 1.00 mg/dL  8.29  9.37   Sodium 135 - 145 mmol/L 136  134  132   Potassium 3.5 - 5.1 mmol/L 4.1  4.3  4.3   Chloride 98 - 111 mmol/L 107  93  99    CO2 22 - 32 mmol/L 27  29  26    Calcium 8.9 - 10.3 mg/dL 7.8  9.2  9.1   Total Protein 6.5 - 8.1 g/dL 5.8  7.7    Total Bilirubin 0.3 - 1.2 mg/dL 0.9  0.9    Alkaline Phos 38 - 126 U/L 46  57    AST 15 - 41 U/L 24  36    ALT 0 - 44 U/L 21  27         Micro Results No results found for this or any previous visit (from the past 240 hour(s)).  Radiology Reports CT Abdomen Pelvis W Contrast  Result Date: 01/02/2022 CLINICAL DATA:  Nausea and vomiting.  Recent colonoscopy. EXAM: CT ABDOMEN AND PELVIS WITH CONTRAST TECHNIQUE: Multidetector CT imaging of the abdomen and pelvis was performed using the standard protocol following bolus administration of intravenous contrast. RADIATION DOSE REDUCTION: This exam was performed according to the departmental dose-optimization program which includes automated exposure control, adjustment of the mA and/or kV according to patient size and/or use of iterative reconstruction technique. CONTRAST:  OMNIPAQUE IOHEXOL 300 MG/ML  SOLN COMPARISON:  04/04/2018, 10/22/2018. FINDINGS: Lower chest: There is a large hiatal hernia with atelectasis at the left lung base. Hepatobiliary: The previously described hypodense mass in the left lobe of the liver is not visualized on this exam. Mild fatty infiltration of the liver. A stable nodule is noted along the posterior aspect of the liver measuring 7 mm. No gallstones, gallbladder wall thickening, or biliary dilatation. Pancreas: There is a 4 mm hypodensity in the body of the pancreas. No pancreatic ductal dilatation. No surrounding inflammatory changes. Spleen: Normal in size without focal abnormality. Adrenals/Urinary Tract: No adrenal nodule or mass. The kidneys enhance symmetrically. No renal calculus bilaterally. There is mild fullness of the renal collecting systems bilaterally to the level of the pelvis with no obstructing calculus visualized. The bladder is unremarkable. Stomach/Bowel: There is a large hiatal  hernia. Thickening of the walls of the gastric antrum is noted and there is enhancement of the gastric mucosa. No bowel obstruction, free air, or pneumatosis. Scattered diverticula are present along the colon. There is marked colonic wall thickening with surrounding fat stranding at the rectosigmoid colon compatible with diverticulitis. Normal appendix is seen in the right lower quadrant.  Vascular/Lymphatic: Aortic atherosclerosis. No enlarged abdominal or pelvic lymph nodes. Reproductive: Uterus and bilateral adnexa are unremarkable. Other: No abdominopelvic ascites. Musculoskeletal: Degenerative changes in the thoracolumbar spine. No acute osseous abnormality. IMPRESSION: 1. Colonic diverticulosis with marked thickening of the walls of the sigmoid colon and rectum with surrounding fat stranding suggesting acute diverticulitis/proctitis. No abscess or free air. 2. Large hiatal hernia. There is mild thickening of the walls of the gastric antrum with gastric mucosal enhancement possible gastritis. 3. Prominence of the renal collecting systems bilaterally to the level of the pelvis with no evidence obstructive calculus. Findings may be associated with inflammatory changes in the pelvis. 4. Stable nodule in the posterior aspect of the liver. The previously described hypodense region in the left lobe of the liver is not visualized on this exam. 5. Stable 4 mm hypodensity in the body of the pancreas, unchanged from MRI abdomen 2020. 6. Aortic atherosclerosis. Electronically Signed   By: Thornell Sartorius M.D.   On: 01/02/2022 22:50    SIGNED: Kendell Bane, MD, FHM. [facility],  Pager (please use [email or web address] to page/text) Please use Epic Secure Chat for non-urgent communication (7AM-7PM)  If 7PM-7AM, please contact night-coverage [email or web address], 01/03/2022, 12:43 PM

## 2022-01-03 NOTE — TOC Progression Note (Signed)
Transition of Care [facility]) - Progression Note    Patient Details  Name: Sarah Page MRN: [id number] Date of Birth: 12/27/1940  Transition of [facility]) CM/SW Contact  Karn Cassis, [place] Phone Number: 01/03/2022, 10:19 AM  Clinical Narrative:   Transition of Care [facility]) Screening Note   Patient Details  Name: Sarah Page Date of Birth: [birth year]   Transition of Care [facility]) CM/SW Contact:    Karn Cassis, LCSW Phone Number: 01/03/2022, 10:19 AM    Transition of Care Department [facility]) has reviewed patient and no TOC needs have been identified at this time. We will continue to monitor patient advancement through interdisciplinary progression rounds. If new patient transition needs arise, please place a TOC consult.            Expected Discharge Plan and Services                                                 Social Determinants of Health (SDOH) Interventions    Readmission Risk Interventions     No data to display

## 2022-01-03 NOTE — H&P (Signed)
History and Physical    Patient: Sarah Page MWU:[id number] DOB: [birth year] DOA: 01/02/2022 DOS: the patient was seen and examined on 01/03/2022 PCP: Royann Shivers, PA-C  Patient coming from: Home  Chief Complaint:  Chief Complaint  Patient presents with   Emesis   HPI: [name] is a 81 y.o. female with medical history significant of chronic diarrhea, history of diverticulitis, hypertension, hyperlipidemia, depression, overactive bladder who presents to the emergency department due to several days of nausea, vomiting and diarrhea.  She had colonoscopy on 9/5 with Dr. Levon Hedger which showed: - Hemorrhoids found on perianal exam. - Diverticulosis in the entire examined colon. - The entire examined colon is normal. Biopsied. - Tortuous colon. - The distal rectum and anal verge are normal on retroflexion view.  Patient endorsed about 6 months of chronic diarrhea which was thought to be related to diet, so, patient has been selective over the last couple of months on food eaten and thereby avoiding foods that provoke the diarrhea. She complained of having coffee-ground emesis at home and has noted black stool within the last 2 days, she developed chest heaviness last evening, this was associated with abdominal cramping/discomfort.  She has also had some diarrhea which was loose to watery.  She denied eating out, fever, chills, having any sick contacts.  ED Course:  In the emergency department, she was hemodynamically stable.  Work-up in the ED showed normocytic anemia, BMP showed sodium 134, potassium 4.3, chloride 93, CO2 29, glucose 131, BUN 31, creatinine 0.95, liver enzymes were normal.  Troponin x1 was negative, FOBT was positive, urinalysis was positive for large leukocytes. CT abdomen and pelvis with contrast showed: 1. Colonic diverticulosis with marked thickening of the walls of the sigmoid colon and rectum with surrounding fat stranding suggesting acute  diverticulitis/proctitis. No abscess or free air. 2. Large hiatal hernia. There is mild thickening of the walls of the gastric antrum with gastric mucosal enhancement possible gastritis. 3. Prominence of the renal collecting systems bilaterally to the level of the pelvis with no evidence obstructive calculus. Findings may be associated with inflammatory changes in the pelvis. 4. Stable nodule in the posterior aspect of the liver. The previously described hypodense region in the left lobe of the liver is not visualized on this exam. 5. Stable 4 mm hypodensity in the body of the pancreas, unchanged from MRI abdomen 2020. 6. Aortic atherosclerosis.  IV Zofran was given, Protonix was given and patient was started on empiric IV Zosyn due to presumed acute diverticulitis/proctitis, IV hydration was provided.  Hospitalist was asked to admit patient for further evaluation and management.  Review of Systems: Review of systems as noted in the HPI. All other systems reviewed and are negative.   Past Medical History:  Diagnosis Date   Anemia    Arthritis    Gait disturbance    Rotator cuff tear    DR Magnus Ivan   Stroke [facility])    Vision abnormalities    Past Surgical History:  Procedure Laterality Date   ORIF CLAVICULAR FRACTURE Left     Social History:  reports that she has never smoked. She has never been exposed to tobacco smoke. She has never used smokeless tobacco. She reports current alcohol use. She reports that she does not use drugs.   No Known Allergies  Family History  Problem Relation Age of Onset   Stroke Mother    Transient ischemic attack Mother    Stroke Father    Heart attack  Father    Mental illness Brother    Breast cancer Daughter 15       [name]     Prior to Admission medications   Medication Sig Start Date End Date Taking? Authorizing Provider  acetaminophen (TYLENOL) 650 MG CR tablet Take 1,300 mg by mouth every 8 (eight) hours.    [provider]   amLODipine-olmesartan (AZOR) 5-20 MG tablet Take 1 tablet by mouth daily. 05/25/21   [provider]  calcium carbonate (OSCAL) 1500 (600 Ca) MG TABS tablet Take 600 mg of elemental calcium by mouth in the morning and at bedtime.    [provider]  Cholecalciferol (VITAMIN D) 50 MCG (2000 UT) CAPS Take 2,000 Units by mouth daily.    [provider]  clopidogrel (PLAVIX) 75 MG tablet Take 75 mg by mouth daily.  03/10/18   [provider]  colestipol (COLESTID) 1 g tablet Take 2 tablets (2 g total) by mouth daily. Take 4 hours apart from other medicines 12/27/21   Marguerita Merles, Reuel Boom, MD  Cyanocobalamin (VITAMIN B 12 PO) Take 1,000 mg by mouth daily.    [provider]  ezetimibe (ZETIA) 10 MG tablet Take 10 mg by mouth daily.    [provider]  FIBER ADULT GUMMIES PO Take 2 Pieces by mouth daily.    [provider]  hydroxychloroquine (PLAQUENIL) 200 MG tablet Take 200 mg by mouth daily. 05/25/21   [provider]  leflunomide (ARAVA) 10 MG tablet Take 10 mg by mouth daily. 05/25/21   [provider]  Melatonin 10 MG TABS Take 10 mg by mouth at bedtime.    [provider]  polyvinyl alcohol (LIQUIFILM TEARS) 1.4 % ophthalmic solution Place 1 drop into both eyes as needed for dry eyes.    [provider]  Probiotic Product (ALIGN PO) Take 1 capsule by mouth daily.    [provider]  sertraline (ZOLOFT) 25 MG tablet Take 25 mg by mouth daily.    [provider]  solifenacin (VESICARE) 5 MG tablet Take 5 mg by mouth daily.    [provider]  Vibegron (GEMTESA) 75 MG TABS Take 75 mg by mouth daily at 6 (six) AM.    [provider]    Physical Exam: BP (!) 151/94 (BP Location: Right Arm)   Pulse (!) 110   Temp (!) 96.9 F (36.1 C)   Resp 16   Ht 5\' 4"  (1.626 m)   Wt 48.9 kg   SpO2 90%   BMI 18.50 kg/m   General: 81 y.o. year-old female well developed well  nourished in no acute distress.  Alert and oriented x3. HEENT: NCAT, EOMI, dry mucous membrane Neck: Supple, trachea medial Cardiovascular: Regular rate and rhythm with no rubs or gallops.  No thyromegaly or JVD noted.  No lower extremity edema. 2/4 pulses in all 4 extremities. Respiratory: Clear to auscultation with no wheezes or rales. Good inspiratory effort. Abdomen: Soft, tender to palpation in the epigastric and periumbilical area.  Nondistended with normal bowel sounds x4 quadrants. Muskuloskeletal: No cyanosis, clubbing or edema noted bilaterally Neuro: CN II-XII intact, strength 5/5 x 4, sensation, reflexes intact Skin: No ulcerative lesions noted or rashes Psychiatry: Judgement and insight appear normal. Mood is appropriate for condition and setting          Labs on Admission:  Basic Metabolic Panel: Recent Labs  Lab 01/02/22 1932  NA 134*  K 4.3  CL 93*  CO2 29  GLUCOSE 131*  BUN 31*  CREATININE 0.95  CALCIUM 9.2   Liver Function Tests: Recent Labs  Lab 01/02/22 1932  AST 36  ALT 27  ALKPHOS 57  BILITOT 0.9  PROT 7.7  ALBUMIN 4.2   No results for input(s): "LIPASE", "AMYLASE" in the last 168 hours. No results for input(s): "AMMONIA" in the last 168 hours. CBC: Recent Labs  Lab 01/02/22 1932  WBC 8.8  HGB 11.8*  HCT 36.7  MCV 93.9  PLT 316   Cardiac Enzymes: No results for input(s): "CKTOTAL", "CKMB", "CKMBINDEX", "TROPONINI" in the last 168 hours.  BNP (last 3 results) No results for input(s): "BNP" in the last 8760 hours.  ProBNP (last 3 results) No results for input(s): "PROBNP" in the last 8760 hours.  CBG: No results for input(s): "GLUCAP" in the last 168 hours.  Radiological Exams on Admission: CT Abdomen Pelvis W Contrast  Result Date: 01/02/2022 CLINICAL DATA:  Nausea and vomiting.  Recent colonoscopy. EXAM: CT ABDOMEN AND PELVIS WITH CONTRAST TECHNIQUE: Multidetector CT imaging of the abdomen and pelvis was performed using the  standard protocol following bolus administration of intravenous contrast. RADIATION DOSE REDUCTION: This exam was performed according to the departmental dose-optimization program which includes automated exposure control, adjustment of the mA and/or kV according to patient size and/or use of iterative reconstruction technique. CONTRAST:  OMNIPAQUE IOHEXOL 300 MG/ML  SOLN COMPARISON:  04/04/2018, 10/22/2018. FINDINGS: Lower chest: There is a large hiatal hernia with atelectasis at the left lung base. Hepatobiliary: The previously described hypodense mass in the left lobe of the liver is not visualized on this exam. Mild fatty infiltration of the liver. A stable nodule is noted along the posterior aspect of the liver measuring 7 mm. No gallstones, gallbladder wall thickening, or biliary dilatation. Pancreas: There is a 4 mm hypodensity in the body of the pancreas. No pancreatic ductal dilatation. No surrounding inflammatory changes. Spleen: Normal in size without focal abnormality. Adrenals/Urinary Tract: No adrenal nodule or mass. The kidneys enhance symmetrically. No renal calculus bilaterally. There is mild fullness of the renal collecting systems bilaterally to the level of the pelvis with no obstructing calculus visualized. The bladder is unremarkable. Stomach/Bowel: There is a large hiatal hernia. Thickening of the walls of the gastric antrum is noted and there is enhancement of the gastric mucosa. No bowel obstruction, free air, or pneumatosis. Scattered diverticula are present along the colon. There is marked colonic wall thickening with surrounding fat stranding at the rectosigmoid colon compatible with diverticulitis. Normal appendix is seen in the right lower quadrant. Vascular/Lymphatic: Aortic atherosclerosis. No enlarged abdominal or pelvic lymph nodes. Reproductive: Uterus and bilateral adnexa are unremarkable. Other: No abdominopelvic ascites. Musculoskeletal: Degenerative changes in the  thoracolumbar spine. No acute osseous abnormality. IMPRESSION: 1. Colonic diverticulosis with marked thickening of the walls of the sigmoid colon and rectum with surrounding fat stranding suggesting acute diverticulitis/proctitis. No abscess or free air. 2. Large hiatal hernia. There is mild thickening of the walls of the gastric antrum with gastric mucosal enhancement possible gastritis. 3. Prominence of the renal collecting systems bilaterally to the level of the pelvis with no evidence obstructive calculus. Findings may be associated with inflammatory changes in the pelvis. 4. Stable nodule in the posterior aspect of the liver. The previously described hypodense region in the left lobe of the liver is not visualized on this exam. 5. Stable 4 mm hypodensity in the body of the pancreas, unchanged from MRI abdomen 2020. 6. Aortic atherosclerosis. Electronically Signed  By: Thornell Sartorius M.D.   On: 01/02/2022 22:50    EKG: I independently viewed the EKG done and my findings are as followed: Sinus tachycardia with frequent PVCs at a rate of 110 bpm and nonspecific ST-T wave abnormality  Assessment/Plan Present on Admission:  GI bleed  Diarrhea  Acute diverticulitis  Dehydration  Principal Problem:   Acute diverticulitis Active Problems:   Dehydration   Diarrhea   GI bleed   Nausea & vomiting   Generalized weakness   Essential hypertension   Mixed hyperlipidemia   Overactive bladder   Insomnia   Abdominal pain  Nausea, vomiting, abdominal pain and diarrhea in the setting of acute diverticulitis/proctitis Patient will be admitted to [facility] Continue IV NS at 29mLs/Hr Continue IV Zosyn 3.375 q.8h Continue IV morphine 2 mg q.4h p.r.n. for moderate to severe pain Continue IV Zofran p.r.n. Continue clear liquid diet with plan to advanced diet as tolerated  Generalized weakness in the setting of above Continue fall precaution and neurochecks Protein supplement to be provided  GI bleed H/H=  11.8/36.7, this was 11.4/35.3 on 12/21/2021 Hemoccult was positive Continue IV Protonix 40 twice daily Gastroenterologist  will be consulted in the morning  Dehydration Continue IV hydration  Essential hypertension (uncontrolled) Continue Azor  Mixed hyperlipidemia Continue colestipol and Zetia  Overactive bladder Continue Myrbetriq  Insomnia Continue melatonin  Other home meds: Os-Cal, vitamin D  DVT prophylaxis: Lovenox  Code Status: Full code  Consults: Gastroenterology  Family Communication: Daughter at bedside (all questions answered to satisfaction)  Severity of Illness: The appropriate patient status for this patient is INPATIENT. Inpatient status is judged to be reasonable and necessary in order to provide the required intensity of service to ensure the patient's safety. The patient's presenting symptoms, physical exam findings, and initial radiographic and laboratory data in the context of their chronic comorbidities is felt to place them at high risk for further clinical deterioration. Furthermore, it is not anticipated that the patient will be medically stable for discharge from the hospital within 2 midnights of admission.   * I certify that at the point of admission it is my clinical judgment that the patient will require inpatient hospital care spanning beyond 2 midnights from the point of admission due to high intensity of service, high risk for further deterioration and high frequency of surveillance required.*  Author: Frankey Shown, DO 01/03/2022 4:49 AM  For on call review [email or web address].

## 2022-01-03 NOTE — Telephone Encounter (Signed)
Thanks for the update, the call team is already on board with her case

## 2022-01-03 NOTE — Hospital Course (Addendum)
Sarah Page is a 81 y.o. female with medical history significant of chronic diarrhea, history of diverticulitis, hypertension, hyperlipidemia, depression, overactive bladder who presents to the emergency department due to several days of nausea, vomiting and diarrhea.  She had colonoscopy on 9/5 with Dr. Levon Hedger which showed: - Hemorrhoids found on perianal exam. - Diverticulosis in the entire examined colon. - The entire examined colon is normal. Biopsied. - Tortuous colon. - The distal rectum and anal verge are normal on retroflexion view.   Patient endorsed about 6 months of chronic diarrhea which was thought to be related to diet, so, patient has been selective over the last couple of months on food eaten and thereby avoiding foods that provoke the diarrhea. She complained of having coffee-ground emesis at home and has noted black stool within the last 2 days, she developed chest heaviness last evening, this was associated with abdominal cramping/discomfort.  She has also had some diarrhea which was loose to watery.  She denied eating out, fever, chills, having any sick contacts.   ED Course:  Hemodynamically stable, work-up in the ED showed normocytic anemia, BMP showed sodium 134, potassium 4.3, chloride 93, CO2 29, glucose 131, BUN 31, creatinine 0.95, liver enzymes were normal.  Troponin x1 was negative, FOBT was positive, urinalysis was positive for large leukocytes.  CT abdomen and pelvis with contrast showed: -Reviewed: Diverticulitis-findings suggesting of acute diverticulitis/proctitis -Large hiatal hernia - Stable nodule posterior liver, Stable 4 mm hypodensity in the body of the pancreas, unchanged from MRI abdomen 2020. 6. Aortic atherosclerosis.   IV Zofran was given, Protonix was given and patient was started on empiric IV Zosyn due to presumed acute diverticulitis/proctitis, IV hydration was provided.     Assessment/Plan Present on Admission:  GI bleed  Diarrhea   Acute diverticulitis  Dehydration   Principal Problem:   Acute diverticulitis Active Problems:   Dehydration   Diarrhea   GI bleed   Nausea & vomiting   Generalized weakness   Essential hypertension   Mixed hyperlipidemia   Overactive bladder   Insomnia   Abdominal pain   Nausea, vomiting, abdominal pain and diarrhea in the setting of acute diverticulitis/proctitis -Hemodynamically stable -Continue gentle IV fluid hydration, IV antibiotics -As needed Zofran, morphine, -Clear liquid diet for now -N.p.o.  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  - Await pathology results... To be followed with GI as an outpatient - Repeat upper endoscopy in 3 months for surveillance.  - Start ferrous sulfate 325 mg qday.    Generalized weakness in the setting of above -Fall precaution, PT/OT Protein supplement to be provided   GI bleed H/H= 11.8/36.7, this was 11.4/35.3 on 12/21/2021    Latest Ref Rng & Units 01/05/2022    4:24 AM 01/04/2022    9:45 AM 01/04/2022    4:09 AM  CBC  WBC 4.0 - 10.5 K/uL 4.1  5.0  4.6   Hemoglobin 12.0 - 15.0 g/dL 52.7  7.3  7.9   Hematocrit 36.0 - 46.0 % 32.7  24.0  24.8   Platelets 150 - 400 K/uL 167  192  202     Hemoccult was positive Continue IV Protonix 40 twice daily Gastroenterologist  will be consulted -n.p.o. after midnight for possible EGD 01/04/2022 -Currently stable: Her hemoglobin dropped to 7.3 Will transfuse if patient  becomes symptomatic 01/04/22 EGD evaluation -found gastroesophageal ulcer likely post bleeding, and 8 other gastric ulcers Hemoglobin drop 7.3>> obtaining consent for 2U PRBC blood transfusion.  Dehydration Continue IV hydration   Essential hypertension (uncontrolled) Continue Azor   Mixed hyperlipidemia Continue colestipol and Zetia    Overactive bladder Continue Myrbetriq   Insomnia Continue melatonin   Other home meds: Os-Cal, vitamin D

## 2022-01-04 ENCOUNTER — Inpatient Hospital Stay (HOSPITAL_COMMUNITY): Payer: Medicare PPO | Admitting: Anesthesiology

## 2022-01-04 ENCOUNTER — Encounter (HOSPITAL_COMMUNITY)
Admission: EM | Disposition: A | Discharge status: Home / Self Care | Payer: Self-pay | Source: Home / Self Care | Attending: Family Medicine

## 2022-01-04 ENCOUNTER — Encounter (HOSPITAL_COMMUNITY): Payer: Self-pay | Admitting: Internal Medicine

## 2022-01-04 DIAGNOSIS — K449 Diaphragmatic hernia without obstruction or gangrene: Secondary | ICD-10-CM | POA: Diagnosis not present

## 2022-01-04 DIAGNOSIS — K921 Melena: Secondary | ICD-10-CM

## 2022-01-04 DIAGNOSIS — K2211 Ulcer of esophagus with bleeding: Secondary | ICD-10-CM

## 2022-01-04 DIAGNOSIS — T182XXA Foreign body in stomach, initial encounter: Secondary | ICD-10-CM | POA: Diagnosis not present

## 2022-01-04 DIAGNOSIS — K259 Gastric ulcer, unspecified as acute or chronic, without hemorrhage or perforation: Secondary | ICD-10-CM

## 2022-01-04 DIAGNOSIS — K2289 Other specified disease of esophagus: Secondary | ICD-10-CM

## 2022-01-04 DIAGNOSIS — K5792 Diverticulitis of intestine, part unspecified, without perforation or abscess without bleeding: Secondary | ICD-10-CM | POA: Diagnosis not present

## 2022-01-04 HISTORY — PX: BIOPSY: SHX5522

## 2022-01-04 HISTORY — PX: ESOPHAGOGASTRODUODENOSCOPY (EGD) WITH PROPOFOL: SHX5813

## 2022-01-04 LAB — CBC
HCT: 24.8 % — ABNORMAL LOW (ref 36.0–46.0)
HCT: 24 % — ABNORMAL LOW (ref 36.0–46.0)
Hemoglobin: 7.9 g/dL — ABNORMAL LOW (ref 12.0–15.0)
Hemoglobin: 7.3 g/dL — ABNORMAL LOW (ref 12.0–15.0)
MCH: 30.7 pg (ref 26.0–34.0)
MCH: 30.3 pg (ref 26.0–34.0)
MCHC: 31.9 g/dL (ref 30.0–36.0)
MCHC: 30.4 g/dL (ref 30.0–36.0)
MCV: 96.5 fL (ref 80.0–100.0)
MCV: 99.6 fL (ref 80.0–100.0)
Platelets: 202 10*3/uL (ref 150–400)
Platelets: 192 10*3/uL (ref 150–400)
RBC: 2.57 MIL/uL — ABNORMAL LOW (ref 3.87–5.11)
RBC: 2.41 MIL/uL — ABNORMAL LOW (ref 3.87–5.11)
RDW: 14.5 % (ref 11.5–15.5)
RDW: 14.5 % (ref 11.5–15.5)
WBC: 4.6 10*3/uL (ref 4.0–10.5)
WBC: 5 10*3/uL (ref 4.0–10.5)
nRBC: 0 % (ref 0.0–0.2)
nRBC: 0 % (ref 0.0–0.2)

## 2022-01-04 LAB — BASIC METABOLIC PANEL
Anion gap: 7 (ref 5–15)
BUN: 25 mg/dL — ABNORMAL HIGH (ref 8–23)
CO2: 22 mmol/L (ref 22–32)
Calcium: 8.3 mg/dL — ABNORMAL LOW (ref 8.9–10.3)
Chloride: 109 mmol/L (ref 98–111)
Creatinine, Ser: 0.66 mg/dL (ref 0.44–1.00)
GFR, Estimated: >60 mL/min (ref >60)
Glucose, Bld: 96 mg/dL (ref 70–99)
Potassium: 3.2 mmol/L — ABNORMAL LOW (ref 3.5–5.1)
Sodium: 138 mmol/L (ref 135–145)

## 2022-01-04 LAB — PREPARE RBC (CROSSMATCH)

## 2022-01-04 LAB — ABO/RH: ABO/RH(D): O POS

## 2022-01-04 SURGERY — ESOPHAGOGASTRODUODENOSCOPY (EGD) WITH PROPOFOL
Anesthesia: General

## 2022-01-04 MED ORDER — FUROSEMIDE 10 MG/ML IJ SOLN: 20.0000 mg | Freq: Once | INTRAMUSCULAR | Status: DC

## 2022-01-04 MED ORDER — SERTRALINE HCL 50 MG PO TABS
25.0000 mg | ORAL_TABLET | Freq: Every day | ORAL | Status: DC
  Administered 2022-01-05: 25 mg via ORAL
  Filled 2022-01-04: qty 1

## 2022-01-04 MED ORDER — PHENYLEPHRINE 80 MCG/ML (10ML) SYRINGE FOR IV PUSH (FOR BLOOD PRESSURE SUPPORT)
PREFILLED_SYRINGE | INTRAVENOUS | Status: DC | PRN
  Administered 2022-01-04: 80 ug via INTRAVENOUS

## 2022-01-04 MED ORDER — VITAMIN D 25 MCG (1000 UNIT) PO TABS
2000.0000 [IU] | ORAL_TABLET | Freq: Every day | ORAL | Status: DC
  Administered 2022-01-05: 2000 [IU] via ORAL
  Filled 2022-01-04: qty 2

## 2022-01-04 MED ORDER — ACETAMINOPHEN 325 MG PO TABS
650.0000 mg | ORAL_TABLET | Freq: Once | ORAL | Status: AC
  Administered 2022-01-04: 650 mg via ORAL
  Filled 2022-01-04: qty 2

## 2022-01-04 MED ORDER — AMLODIPINE-OLMESARTAN 5-20 MG PO TABS: 1.0000 | ORAL_TABLET | Freq: Every day | ORAL | Status: DC

## 2022-01-04 MED ORDER — ACETAMINOPHEN 325 MG PO TABS: 650.0000 mg | ORAL_TABLET | Freq: Once | ORAL | Status: DC

## 2022-01-04 MED ORDER — AMLODIPINE BESYLATE 5 MG PO TABS: 5.0000 mg | ORAL_TABLET | Freq: Every day | ORAL | Status: DC

## 2022-01-04 MED ORDER — PROPOFOL 500 MG/50ML IV EMUL
INTRAVENOUS | Status: DC | PRN
  Administered 2022-01-04: 150 ug/kg/min via INTRAVENOUS

## 2022-01-04 MED ORDER — SODIUM CHLORIDE 0.9% IV SOLUTION: Freq: Once | INTRAVENOUS | Status: AC

## 2022-01-04 MED ORDER — IRBESARTAN 150 MG PO TABS: 150.0000 mg | ORAL_TABLET | Freq: Every day | ORAL | Status: DC

## 2022-01-04 MED ORDER — EZETIMIBE 10 MG PO TABS
10.0000 mg | ORAL_TABLET | Freq: Every day | ORAL | Status: DC
  Administered 2022-01-05: 10 mg via ORAL
  Filled 2022-01-04: qty 1

## 2022-01-04 MED ORDER — LIDOCAINE HCL (CARDIAC) PF 100 MG/5ML IV SOSY
PREFILLED_SYRINGE | INTRAVENOUS | Status: DC | PRN
  Administered 2022-01-04: 60 mg via INTRATRACHEAL
  Administered 2022-01-04: 40 mg via INTRATRACHEAL

## 2022-01-04 MED ORDER — MELATONIN 3 MG PO TABS
9.0000 mg | ORAL_TABLET | Freq: Every day | ORAL | Status: DC
  Administered 2022-01-04: 9 mg via ORAL
  Filled 2022-01-04: qty 3

## 2022-01-04 MED ORDER — PHENYLEPHRINE 80 MCG/ML (10ML) SYRINGE FOR IV PUSH (FOR BLOOD PRESSURE SUPPORT)
PREFILLED_SYRINGE | INTRAVENOUS | Status: AC
  Filled 2022-01-04: qty 10

## 2022-01-04 MED ORDER — CALCIUM CARBONATE 1250 (500 CA) MG PO TABS
1250.0000 mg | ORAL_TABLET | Freq: Every day | ORAL | Status: DC
  Administered 2022-01-05: 1250 mg via ORAL
  Filled 2022-01-04: qty 1

## 2022-01-04 MED ORDER — PROPOFOL 1000 MG/100ML IV EMUL
INTRAVENOUS | Status: AC
  Filled 2022-01-04: qty 100

## 2022-01-04 MED ORDER — MIRABEGRON ER 25 MG PO TB24
25.0000 mg | ORAL_TABLET | Freq: Every day | ORAL | Status: DC
  Administered 2022-01-05: 25 mg via ORAL
  Filled 2022-01-04: qty 1

## 2022-01-04 MED ORDER — MELATONIN 5 MG PO TABS
10.0000 mg | ORAL_TABLET | Freq: Every day | ORAL | Status: DC
  Filled 2022-01-04: qty 2

## 2022-01-04 MED ORDER — DIPHENHYDRAMINE HCL 25 MG PO CAPS: 25.0000 mg | ORAL_CAPSULE | Freq: Once | ORAL | Status: DC

## 2022-01-04 MED ORDER — COLESTIPOL HCL 1 G PO TABS
2.0000 g | ORAL_TABLET | Freq: Every day | ORAL | Status: DC
  Administered 2022-01-05: 2 g via ORAL
  Filled 2022-01-04 (×5): qty 2

## 2022-01-04 MED ORDER — POTASSIUM CHLORIDE CRYS ER 20 MEQ PO TBCR
40.0000 meq | EXTENDED_RELEASE_TABLET | Freq: Once | ORAL | Status: AC
  Administered 2022-01-04: 40 meq via ORAL
  Filled 2022-01-04: qty 2

## 2022-01-04 MED ORDER — SODIUM CHLORIDE 0.9 % IV SOLN: INTRAVENOUS | Status: DC

## 2022-01-04 NOTE — Anesthesia Procedure Notes (Signed)
Procedure Name: MAC Date/Time: 01/04/2022 1:46 PM  Performed by: Lieutenant Diego, CRNAPre-anesthesia Checklist: Emergency Drugs available, Suction available, Patient identified, Patient being monitored and Timeout performed Patient Re-evaluated:Patient Re-evaluated prior to induction Oxygen Delivery Method: Nasal cannula Preoxygenation: Pre-oxygenation with 100% oxygen Induction Type: IV induction

## 2022-01-04 NOTE — Progress Notes (Signed)
PROGRESS NOTE    Patient: Sarah Page                            PCP: Sheela Stack                    DOB: March 27, 1941            DOA: 01/02/2022 ZOX:[id number]             DOS: 01/04/2022, 1:02 PM   LOS: 1 day   Date of Service: The patient was seen and examined on 01/04/2022  Subjective:   The patient was seen and examined this morning, in no acute distress Noted 1 bowel movement which tarry black this morning No frank bleeding  Stable n.p.o. in anticipation of EGD today    Brief Narrative:   Sarah Page is a 81 y.o. female with medical history significant of chronic diarrhea, history of diverticulitis, hypertension, hyperlipidemia, depression, overactive bladder who presents to the emergency department due to several days of nausea, vomiting and diarrhea.  She had colonoscopy on 9/5 with Dr. Levon Hedger which showed: - Hemorrhoids found on perianal exam. - Diverticulosis in the entire examined colon. - The entire examined colon is normal. Biopsied. - Tortuous colon. - The distal rectum and anal verge are normal on retroflexion view.   Patient endorsed about 6 months of chronic diarrhea which was thought to be related to diet, so, patient has been selective over the last couple of months on food eaten and thereby avoiding foods that provoke the diarrhea. She complained of having coffee-ground emesis at home and has noted black stool within the last 2 days, she developed chest heaviness last evening, this was associated with abdominal cramping/discomfort.  She has also had some diarrhea which was loose to watery.  She denied eating out, fever, chills, having any sick contacts.   ED Course:  Hemodynamically stable, work-up in the ED showed normocytic anemia, BMP showed sodium 134, potassium 4.3, chloride 93, CO2 29, glucose 131, BUN 31, creatinine 0.95, liver enzymes were normal.  Troponin x1 was negative, FOBT was positive, urinalysis was positive for large  leukocytes.  CT abdomen and pelvis with contrast showed: -Reviewed: Diverticulitis-findings suggesting of acute diverticulitis/proctitis -Large hiatal hernia - Stable nodule posterior liver, Stable 4 mm hypodensity in the body of the pancreas, unchanged from MRI abdomen 2020. 6. Aortic atherosclerosis.   IV Zofran was given, Protonix was given and patient was started on empiric IV Zosyn due to presumed acute diverticulitis/proctitis, IV hydration was provided.     Assessment/Plan Present on Admission:  GI bleed  Diarrhea  Acute diverticulitis  Dehydration   Principal Problem:   Acute diverticulitis Active Problems:   Dehydration   Diarrhea   GI bleed   Nausea & vomiting   Generalized weakness   Essential hypertension   Mixed hyperlipidemia   Overactive bladder   Insomnia   Abdominal pain   Nausea, vomiting, abdominal pain and diarrhea in the setting of acute diverticulitis/proctitis -Hemodynamically stable -Continue gentle IV fluid hydration, IV antibiotics -As needed Zofran, morphine, -Clear liquid diet for now -N.p.o.  -GI consult for possible GI work-up including EGD on 01/04/22     Generalized weakness in the setting of above -Fall precaution, PT/OT Protein supplement to be provided   GI bleed H/H= 11.8/36.7, this was 11.4/35.3 on 12/21/2021    Latest Ref Rng & Units 01/04/2022    9:45 AM  01/04/2022    4:09 AM 01/03/2022    4:09 AM  CBC  WBC 4.0 - 10.5 K/uL 5.0  4.6  5.7   Hemoglobin 12.0 - 15.0 g/dL 7.3  7.9  9.5   Hematocrit 36.0 - 46.0 % 24.0  24.8  29.5   Platelets 150 - 400 K/uL 192  202  215     Hemoccult was positive Continue IV Protonix 40 twice daily Gastroenterologist  will be consulted -n.p.o. after midnight for possible EGD 01/04/2022 -Currently stable: Her hemoglobin dropped to 7.3 Will transfuse if patient becomes symptomatic Pending EGD evaluation today  Dehydration Continue IV hydration   Essential hypertension  (uncontrolled) Continue Azor   Mixed hyperlipidemia Continue colestipol and Zetia   Overactive bladder Continue Myrbetriq   Insomnia Continue melatonin   Other home meds: Os-Cal, vitamin D  (Formed stool, discontinue stools for C. difficile, and contact precautions)  ----------------------------------------------------------------------------------------------------------------------------------------------- Nutritional status:  The patient's BMI is: Body mass index is 18.5 kg/m. I agree with the assessment and plan as outlined below: Nutrition Status:       --------------------------------------------------------------------------------------------------------------------------------------------  DVT prophylaxis:  SCDs Start: 01/03/22 2023 SCDs Start: 01/03/22 0255   Code Status:   Code Status: Full Code  Family Communication: Daughter present at bedside updated The above findings and plan of care has been discussed with patient (and family)  in detail,  they expressed understanding and agreement of above. -Advance care planning has been discussed.   Admission status:   Status is: Inpatient Remains inpatient appropriate because: Needing IV antibiotics for diverticulitis, proctitis, needing GI work-up     Procedures:   No admission procedures for hospital encounter.   Antimicrobials:  Anti-infectives (From admission, onward)    Start     Dose/Rate Route Frequency Ordered Stop   01/03/22 0600  piperacillin-tazobactam (ZOSYN) IVPB 3.375 g        3.375 g 12.5 mL/hr over 240 Minutes Intravenous Every 8 hours 01/03/22 0438     01/02/22 2330  piperacillin-tazobactam (ZOSYN) IVPB 3.375 g        3.375 g 100 mL/hr over 30 Minutes Intravenous  Once 01/02/22 2323 01/03/22 0018        Medication:   ALPRAZolam  0.5 mg Oral TID   amLODipine  5 mg Oral Daily   And   irbesartan  150 mg Oral Daily   calcium carbonate  1,250 mg Oral Q breakfast   cholecalciferol   2,000 Units Oral Daily   colestipol  2 g Oral Daily   ezetimibe  10 mg Oral Daily   feeding supplement  237 mL Oral BID BM   melatonin  9 mg Oral QHS   mirabegron ER  25 mg Oral Daily   pantoprazole (PROTONIX) IV  40 mg Intravenous Q12H   sertraline  25 mg Oral Daily    acetaminophen **OR** acetaminophen, morphine injection, ondansetron **OR** ondansetron (ZOFRAN) IV   Objective:   Vitals:   01/03/22 0532 01/03/22 0847 01/03/22 1211 01/04/22 0433  BP: 111/70 115/68 130/87 (!) 101/59  Pulse: 73 (!) 105 93 79  Resp: 18 20 18 19   Temp: 98.6 F (37 C)  97.8 F (36.6 C) 98.2 F (36.8 C)  TempSrc: Oral     SpO2: 94% 96% 95% 90%  Weight:      Height:        Intake/Output Summary (Last 24 hours) at 01/04/2022 1302 Last data filed at 01/04/2022 0900 Gross per 24 hour  Intake 142.69 ml  Output --  Net 142.69 ml   Filed Weights   01/02/22 1914 01/03/22 0051  Weight: 48.5 kg 48.9 kg     Examination:   Physical Exam  Constitution:  Alert, cooperative, no distress,  Appears calm and comfortable  Psychiatric:   Normal and stable mood and affect, cognition intact,   HEENT:        Normocephalic, PERRL, otherwise with in Normal limits  Chest:         Chest symmetric Cardio vascular:  S1/S2, RRR, No murmure, No Rubs or Gallops  pulmonary: Clear to auscultation bilaterally, respirations unlabored, negative wheezes / crackles Abdomen: Soft, non-tender, non-distended, bowel sounds,no masses, no organomegaly Muscular skeletal: Limited exam - in bed, able to move all 4 extremities,   Neuro: CNII-XII intact. , normal motor and sensation, reflexes intact  Extremities: No pitting edema lower extremities, +2 pulses  Skin: Dry, warm to touch, negative for any Rashes, No open wounds Wounds: per nursing documentation   ------------------------------------------------------------------------------------------------------------------------------------------    LABs:     Latest Ref Rng  & Units 01/04/2022    9:45 AM 01/04/2022    4:09 AM 01/03/2022    4:09 AM  CBC  WBC 4.0 - 10.5 K/uL 5.0  4.6  5.7   Hemoglobin 12.0 - 15.0 g/dL 7.3  7.9  9.5   Hematocrit 36.0 - 46.0 % 24.0  24.8  29.5   Platelets 150 - 400 K/uL 192  202  215       Latest Ref Rng & Units 01/04/2022    4:09 AM 01/03/2022    4:09 AM 01/02/2022    7:32 PM  CMP  Glucose 70 - 99 mg/dL 96  161  096   BUN 8 - 23 mg/dL 25  25  31    Creatinine 0.44 - 1.00 mg/dL  0.45  4.09   Sodium 135 - 145 mmol/L 138  136  134   Potassium 3.5 - 5.1 mmol/L 3.2  4.1  4.3   Chloride 98 - 111 mmol/L 109  107  93   CO2 22 - 32 mmol/L 22  27  29    Calcium 8.9 - 10.3 mg/dL 8.3  7.8  9.2   Total Protein 6.5 - 8.1 g/dL  5.8  7.7   Total Bilirubin 0.3 - 1.2 mg/dL  0.9  0.9   Alkaline Phos 38 - 126 U/L  46  57   AST 15 - 41 U/L  24  36   ALT 0 - 44 U/L  21  27        Micro Results No results found for this or any previous visit (from the past 240 hour(s)).  Radiology Reports No results found.  SIGNED: 8.11, MD, FHM. [facility],  Pager (please use [email or web address] to page/text) Please use Epic Secure Chat for non-urgent communication (7AM-7PM)  If 7PM-7AM, please contact night-coverage [email or web address], 01/04/2022, 1:02 PM

## 2022-01-04 NOTE — Op Note (Signed)
[facility] Patient Name: Sarah Page Procedure Date: 01/04/2022 1:40 PM MRN: [id number] Date of Birth: 1940/07/26 Attending MD: Katrinka Blazing ,  CSN: [id number] Age: 81 Admit Type: Inpatient Procedure:                Upper GI endoscopy Indications:              Coffee-ground emesis, Melena Providers:                Katrinka Blazing, Angelica Ran, Sheran Fava                            Pandora Leiter, Technician Referring MD:              Medicines:                Monitored Anesthesia Care Complications:            No immediate complications. Estimated Blood Loss:     Estimated blood loss: none. Procedure:                Pre-Anesthesia Assessment:                           - Prior to the procedure, a History and Physical                            was performed, and patient medications, allergies                            and sensitivities were reviewed. The patient's                            tolerance of previous anesthesia was reviewed.                           - The risks and benefits of the procedure and the                            sedation options and risks were discussed with the                            patient. All questions were answered and informed                            consent was obtained.                           After obtaining informed consent, the endoscope was                            passed under direct vision. Throughout the                            procedure, the patient's blood pressure, pulse, and                            oxygen saturations were monitored continuously. The  [id number]-H190 ([id number]) scope was introduced through the                            mouth, and advanced to the third part of duodenum.                            The upper GI endoscopy was accomplished without                            difficulty. The patient tolerated the procedure                            well. Scope In: 1:56:13  PM Scope Out: 2:09:10 PM Total Procedure Duration: 0 hours 12 minutes 57 seconds  Findings:      Clotted blood was found at the gastroesophageal junction. Removal of       food was accomplished with a Roth net. There was a non bleeding ulcer       upon reinspection of the area.      One cratered esophageal ulcer with stigmata of recent bleeding was found       at the gastroesophageal junction. The lesion was 10 mm in largest       dimension.      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 The hiatal       narrowing was 41 cm from the incisors. The Z-line was 33 cm from the       incisors.      A small amount of food (residue) was found in the gastric body. Removal       of food was accomplished with a Roth net.      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 The largest lesion was 5 mm in largest dimension. Biopsies were       taken with a cold forceps for Helicobacter pylori testing.      The examined duodenum was normal. Impression:               - Clotted blood at the gastroesophageal junction.                            Ulcer bed.                           - Esophageal ulcer with stigmata of recent bleeding.                           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                           - A small amount of food (residue) in the stomach.                            Removal was successful.                           -  Non-bleeding gastric ulcers with a clean ulcer                            base (Forrest Class III), Cameron ulcers. Biopsied.                           - Normal examined duodenum. Moderate Sedation:      Per Anesthesia Care Recommendation:           - Return patient to hospital ward for ongoing care.                           - Soft diet.                           - Use Protonix (pantoprazole) 40 mg PO BID.                            - Await pathology results.                           - Repeat upper endoscopy in 3 months for                            surveillance.                           - Start ferrous sulfate 325 mg qday.                           - Check H/H qday. Procedure Code(s):        --- Professional ---                           [phone], Esophagogastroduodenoscopy, flexible,                            transoral; with removal of foreign body(s)                           43239, Esophagogastroduodenoscopy, flexible,                            transoral; with biopsy, single or multiple Diagnosis Code(s):        --- Professional ---                           K22.8, Other specified diseases of esophagus                           K22.11, Ulcer of esophagus with bleeding                           K44.9, Diaphragmatic hernia without obstruction or                            gangrene  K25.9, Gastric ulcer, unspecified as acute or                            chronic, without hemorrhage or perforation                           T18.2XXA, Foreign body in stomach, initial encounter                           K92.0, Hematemesis                           K92.1, Melena (includes Hematochezia) CPT copyright 2019 [employer]. All rights reserved. The codes documented in this report are preliminary and upon coder review may  be revised to meet current compliance requirements. Katrinka Blazing, MD Katrinka Blazing,  01/04/2022 2:22:12 PM This report has been signed electronically. Number of Addenda: 0

## 2022-01-04 NOTE — Progress Notes (Signed)
PROGRESS NOTE    Patient: Sarah Page                            PCP: Sheela Stack                    DOB: 04/03/1941            DOA: 01/02/2022 LAG:[id number]             DOS: 01/04/2022, 2:42 PM   LOS: 1 day   Date of Service: The patient was seen and examined on 01/04/2022  Subjective:   Patient was seen and examined post EGD, mildly somnolent  Results were discussed with the patient and daughter  ----------------------------------------------------------------------------------------------------------------------------------  Assessment/Plan Present on Admission:  GI bleed  Acute diverticulitis    Principal Problem:   Acute diverticulitis   Nausea, vomiting, abdominal pain and diarrhea in the setting of acute diverticulitis/proctitis -Hemodynamically stable -Continue gentle IV fluid hydration, IV antibiotics -As needed Zofran, morphine, -Clear liquid diet for now  -GI consulted: GI work-up EGD on 01/04/22    S/P EGD - One gastroesophageal ulcer and 8 multiple gastric ulcers were identified RECOMMENDATIONS - Return patient to hospital ward for ongoing care.  - Soft diet.  - Use Protonix (pantoprazole) 40 mg PO BID.  - Await pathology results.  - Repeat upper endoscopy in 3 months for surveillance.  - Start ferrous sulfate 325 mg qday. - Check H/H     GI bleed H/H= 11.8/36.7, this was 11.4/35.3 on 12/21/2021    Latest Ref Rng & Units 01/04/2022    9:45 AM 01/04/2022    4:09 AM 01/03/2022    4:09 AM  CBC  WBC 4.0 - 10.5 K/uL 5.0  4.6  5.7   Hemoglobin 12.0 - 15.0 g/dL 7.3  7.9  9.5   Hematocrit 36.0 - 46.0 % 24.0  24.8  29.5   Platelets 150 - 400 K/uL 192  202  215     Hemoccult was positive Continue IV Protonix 40 twice daily  01/04/22 EGD evaluation -found gastroesophageal ulcer likely post bleeding, and 8 other gastric ulcers Hemoglobin drop 7.3>> obtaining consent for 2U PRBC blood transfusion.  Hemoglobin down to 7.3, hypotensive  blood pressure as low as 69/38, currently 97/53  Discussed the pros and cons of 2U PRBC blood transfusion with daughter she has expressed understanding agreement with pursuing with blood transfusion  We will transfuse with 2U PRBC today 01/04/2022    Code Status:   Code Status: Full Code  Family Communication: Discussed with daughter Ms. McDowell The above findings and plan of care has been discussed with patient (and family)  in detail,  they expressed understanding and agreement of above. -Advance care planning has been discussed.       Procedures:   No admission procedures for hospital encounter.   Antimicrobials:  Anti-infectives (From admission, onward)    Start     Dose/Rate Route Frequency Ordered Stop   01/03/22 0600  [MAR Hold]  piperacillin-tazobactam (ZOSYN) IVPB 3.375 g        (MAR Hold since Thu 01/04/2022 at 1327.Hold Reason: Transfer to a Procedural area)   3.375 g 12.5 mL/hr over 240 Minutes Intravenous Every 8 hours 01/03/22 0438     01/02/22 2330  piperacillin-tazobactam (ZOSYN) IVPB 3.375 g        3.375 g 100 mL/hr over 30 Minutes Intravenous  Once 01/02/22 2323 01/03/22 0018  Medication:   sodium chloride   Intravenous Once   acetaminophen  650 mg Oral Once   [MAR Hold] ALPRAZolam  0.5 mg Oral TID   [MAR Hold] calcium carbonate  1,250 mg Oral Q breakfast   [MAR Hold] cholecalciferol  2,000 Units Oral Daily   [MAR Hold] colestipol  2 g Oral Daily   diphenhydrAMINE  25 mg Oral Once   [MAR Hold] ezetimibe  10 mg Oral Daily   [MAR Hold] feeding supplement  237 mL Oral BID BM   furosemide  20 mg Intravenous Once   furosemide  20 mg Intravenous Once   [MAR Hold] melatonin  9 mg Oral QHS   [MAR Hold] mirabegron ER  25 mg Oral Daily   [MAR Hold] pantoprazole (PROTONIX) IV  40 mg Intravenous Q12H   [MAR Hold] sertraline  25 mg Oral Daily    [MAR Hold] acetaminophen **OR** [MAR Hold] acetaminophen, [MAR Hold]  morphine injection, [MAR Hold]  ondansetron **OR** [MAR Hold] ondansetron (ZOFRAN) IV   Objective:   Vitals:   01/04/22 1415 01/04/22 1417 01/04/22 1430 01/04/22 1440  BP: (!) 73/49 (!) 90/52 (!) 97/53   Pulse:  80 83   Resp:   20   Temp:      TempSrc:      SpO2:  92% 91% 98%  Weight:      Height:        Intake/Output Summary (Last 24 hours) at 01/04/2022 1442 Last data filed at 01/04/2022 1402 Gross per 24 hour  Intake 542.69 ml  Output --  Net 542.69 ml   Filed Weights   01/02/22 1914 01/03/22 0051 01/04/22 1318  Weight: 48.5 kg 48.9 kg 48.9 kg     Examination:   Physical Exam  Constitution: Somnolent post endoscopy sedation HEENT:        Normocephalic, PERRL, otherwise with in Normal limits  Chest:         Chest symmetric Cardio vascular:  S1/S2, RRR, No murmure, No Rubs or Gallops  pulmonary: Clear to auscultation bilaterally, respirations unlabored, negative wheezes / crackles Abdomen: Soft, non-tender, non-distended, bowel sounds,no masses, no organomegaly Muscular skeletal: Limited exam - in bed, able to move all 4 extremities,   Neuro: CNII-XII intact. , normal motor and sensation, reflexes intact  Extremities: No pitting edema lower extremities, +2 pulses  Skin: Dry, warm to touch, negative for any Rashes, No open wounds    ------------------------------------------------------------------------------------------------------------------------------------------    LABs:     Latest Ref Rng & Units 01/04/2022    9:45 AM 01/04/2022    4:09 AM 01/03/2022    4:09 AM  CBC  WBC 4.0 - 10.5 K/uL 5.0  4.6  5.7   Hemoglobin 12.0 - 15.0 g/dL 7.3  7.9  9.5   Hematocrit 36.0 - 46.0 % 24.0  24.8  29.5   Platelets 150 - 400 K/uL 192  202  215     Micro Results No results found for this or any previous visit (from the past 240 hour(s)).  Radiology Reports No results found.  SIGNED: Kendell Bane, MD, FHM. [facility],  Pager (please use [email or web address] to page/text) Please use Epic  Secure Chat for non-urgent communication (7AM-7PM)  If 7PM-7AM, please contact night-coverage [email or web address], 01/04/2022, 2:42 PM

## 2022-01-04 NOTE — Transfer of Care (Signed)
Immediate Anesthesia Transfer of Care Note  Patient: Sarah Page  Procedure(s) Performed: ESOPHAGOGASTRODUODENOSCOPY (EGD) WITH PROPOFOL BIOPSY  Patient Location: PACU  Anesthesia Type:MAC  Level of Consciousness: drowsy  Airway & Oxygen Therapy: Patient Spontanous Breathing and Patient connected to nasal cannula oxygen  Post-op Assessment: Report given to RN and Post -op Vital signs reviewed and stable  Post vital signs: Reviewed and stable  Last Vitals:  Vitals Value Taken Time  BP    Temp    Pulse    Resp    SpO2      Last Pain:  Vitals:   01/04/22 1345  TempSrc:   PainSc: 0-No pain      Patients Stated Pain Goal: 7 (68/03/21 2248)  Complications: No notable events documented.

## 2022-01-04 NOTE — Brief Op Note (Signed)
01/02/2022 - 01/04/2022  2:20 PM  PATIENT:  Sarah Page  81 y.o. 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  Tolerated the procedure adequately.  Clotted blood was found at the gastroesophageal junction. Removal of food was accomplished with a Roth net. There was a non bleeding ulcer upon reinspection of the area. One cratered esophageal ulcer with stigmata of recent bleeding was found at the gastroesophageal junction.  The lesion was 10 mm in largest dimension. 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  The hiatal narrowing was 41 cm from the incisors.  The Z-line was 33 cm from the incisors. A small amount of food (residue) was found in the gastric body.  Removal of food was accomplished with a Roth net. 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  The largest lesion was 5 mm in largest dimension.  Biopsies were taken with a cold forceps for Helicobacter pylori testing. The examined duodenum was normal.   RECOMMENDATIONS - Return patient to hospital ward for ongoing care.  - Soft diet.  - Use Protonix (pantoprazole) 40 mg PO BID.  - Await pathology results.  - Repeat upper endoscopy in 3 months for surveillance.  - Start ferrous sulfate 325 mg qday. - Check H/H qday.  Katrinka Blazing, MD Gastroenterology and Hepatology [facility]

## 2022-01-04 NOTE — Progress Notes (Signed)
We will proceed with EGD as scheduled.  I thoroughly discussed with the patient his procedure, including the risks involved. Patient understands what the procedure involves including the benefits and any risks. Patient understands alternatives to the proposed procedure. Risks including (but not limited to) bleeding, tearing of the lining (perforation), rupture of adjacent organs, problems with heart and lung function, infection, and medication reactions. A small percentage of complications may require surgery, hospitalization, repeat endoscopic procedure, and/or transfusion.  Patient understood and agreed.  Ronzell Laban Castaneda, MD Gastroenterology and Hepatology [facility]  

## 2022-01-04 NOTE — Progress Notes (Signed)
Spoke with lab regarding patient's type and screen. Someone to come collect. Patient vitals stable. Alert and oriented. Family provided soft foods for snack. Patient has premeds ordered for blood, tylenol & benadryl with lasix in between doses. Will need to be rescheduled for whenever patient's type and screen drawn. Notified Dr. Flossie Dibble.

## 2022-01-04 NOTE — Anesthesia Preprocedure Evaluation (Addendum)
Anesthesia Evaluation  Patient identified by MRN, date of birth, ID band Patient awake    Reviewed: Allergy & Precautions, NPO status , Patient's Chart, lab work & pertinent test results  Airway Mallampati: II  TM Distance: >3 FB Neck ROM: Full    Dental  (+) Dental Advisory Given Crowns :   Pulmonary neg pulmonary ROS,    Pulmonary exam normal breath sounds clear to auscultation       Cardiovascular hypertension, Pt. on medications Normal cardiovascular exam Rhythm:Regular Rate:Normal  21-Dec-2021 10:21:57  [facility] ROUTINE RECORD March 03, 1941 (80 yr) Female Caucasian Vent. rate 94 BPM PR interval 138 ms QRS duration 66 ms QT/QTcB 362/452 ms P-R-T axes 75 91 74 Sinus rhythm with Premature ventricular complexes Rightward axis Low voltage QRS Borderline ECG When compared with ECG of 05-Jun-2020 17:27, No significant change since last tracing PREVIOUS ECG IS PRESENT Confirmed by Carylon Perches [phone]) on 12/22/2021 6:35:18 AM   Neuro/Psych  Neuromuscular disease CVA, Residual Symptoms negative psych ROS   GI/Hepatic negative GI ROS, Neg liver ROS, hiatal hernia,   Endo/Other  negative endocrine ROS  Renal/GU Renal disease  negative genitourinary   Musculoskeletal  (+) Arthritis , Osteoarthritis,    Abdominal   Peds negative pediatric ROS (+)  Hematology  (+) Blood dyscrasia, anemia ,   Anesthesia Other Findings   Reproductive/Obstetrics negative OB ROS                           Anesthesia Physical  Anesthesia Plan  ASA: 3  Anesthesia Plan: General   Post-op Pain Management: Minimal or no pain anticipated   Induction: Intravenous  PONV Risk Score and Plan: Propofol infusion  Airway Management Planned: Nasal Cannula and Natural Airway  Additional Equipment:   Intra-op Plan:   Post-operative Plan:   Informed Consent: I have reviewed the patients History  and Physical, chart, labs and discussed the procedure including the risks, benefits and alternatives for the proposed anesthesia with the patient or authorized representative who has indicated his/her understanding and acceptance.     Dental advisory given  Plan Discussed with: CRNA and Surgeon  Anesthesia Plan Comments:         Anesthesia Quick Evaluation

## 2022-01-04 NOTE — Anesthesia Postprocedure Evaluation (Signed)
Anesthesia Post Note  Patient: Sarah Page  Procedure(s) Performed: ESOPHAGOGASTRODUODENOSCOPY (EGD) WITH PROPOFOL BIOPSY  Patient location during evaluation: PACU Anesthesia Type: General Level of consciousness: awake and alert and oriented Pain management: pain level controlled Vital Signs Assessment: post-procedure vital signs reviewed and stable Respiratory status: spontaneous breathing, nonlabored ventilation and respiratory function stable Cardiovascular status: blood pressure returned to baseline and stable Postop Assessment: no apparent nausea or vomiting Anesthetic complications: no   No notable events documented.   Last Vitals:  Vitals:   01/04/22 1440 01/04/22 1953  BP:  136/80  Pulse:  99  Resp:  18  Temp:  36.8 C  SpO2: 98% (!) 88%    Last Pain:  Vitals:   01/04/22 1953  TempSrc: Oral  PainSc:                  Jadrian Bulman C Sebastian Lurz

## 2022-01-05 ENCOUNTER — Other Ambulatory Visit: Payer: Self-pay | Admitting: Family Medicine

## 2022-01-05 ENCOUNTER — Telehealth: Payer: Self-pay | Admitting: Gastroenterology

## 2022-01-05 DIAGNOSIS — K5792 Diverticulitis of intestine, part unspecified, without perforation or abscess without bleeding: Secondary | ICD-10-CM | POA: Diagnosis not present

## 2022-01-05 LAB — BASIC METABOLIC PANEL
Anion gap: 7 (ref 5–15)
BUN: 15 mg/dL (ref 8–23)
CO2: 23 mmol/L (ref 22–32)
Calcium: 7.9 mg/dL — ABNORMAL LOW (ref 8.9–10.3)
Chloride: 110 mmol/L (ref 98–111)
Creatinine, Ser: 0.65 mg/dL (ref 0.44–1.00)
GFR, Estimated: >60 mL/min (ref >60)
Glucose, Bld: 95 mg/dL (ref 70–99)
Potassium: 3.2 mmol/L — ABNORMAL LOW (ref 3.5–5.1)
Sodium: 140 mmol/L (ref 135–145)

## 2022-01-05 LAB — CBC
HCT: 32.7 % — ABNORMAL LOW (ref 36.0–46.0)
Hemoglobin: 10.8 g/dL — ABNORMAL LOW (ref 12.0–15.0)
MCH: 30.3 pg (ref 26.0–34.0)
MCHC: 33 g/dL (ref 30.0–36.0)
MCV: 91.9 fL (ref 80.0–100.0)
Platelets: 167 10*3/uL (ref 150–400)
RBC: 3.56 MIL/uL — ABNORMAL LOW (ref 3.87–5.11)
RDW: 15 % (ref 11.5–15.5)
WBC: 4.1 10*3/uL (ref 4.0–10.5)
nRBC: 0 % (ref 0.0–0.2)

## 2022-01-05 MED ORDER — POTASSIUM CHLORIDE CRYS ER 20 MEQ PO TBCR
40.0000 meq | EXTENDED_RELEASE_TABLET | Freq: Once | ORAL | Status: AC
  Administered 2022-01-05: 40 meq via ORAL
  Filled 2022-01-05: qty 2

## 2022-01-05 MED ORDER — FERROUS SULFATE 325 (65 FE) MG PO TABS: 325.0000 mg | ORAL_TABLET | Freq: Every day | ORAL | 3 refills | Status: DC

## 2022-01-05 MED ORDER — PANTOPRAZOLE SODIUM 40 MG PO TBEC: 40.0000 mg | DELAYED_RELEASE_TABLET | Freq: Every day | ORAL | 1 refills | Status: DC

## 2022-01-05 MED ORDER — PANTOPRAZOLE SODIUM 20 MG PO TBEC: 40.0000 mg | DELAYED_RELEASE_TABLET | Freq: Two times a day (BID) | ORAL | Status: DC

## 2022-01-05 NOTE — Telephone Encounter (Signed)
Sarah Page - please arrange hospital follow up in 2-4 weeks with [facility] or Dr. Levon Hedger. If no appointments available please reach out to Lanora Manis at [facility] office for a follow up.  Brooke Bonito, MSN, APRN, FNP-BC, AGACNP-BC [facility]

## 2022-01-05 NOTE — Progress Notes (Unsigned)
{  Select_[email or web address]:[id number]} 

## 2022-01-05 NOTE — Discharge Summary (Signed)
Physician Discharge Summary   Patient: Sarah Page MRN: [id number] DOB: 1940/08/12  Admit date:     01/02/2022  Discharge date: 01/05/22  Discharge Physician: Kendell Bane   PCP: Royann Shivers, PA-C   Recommendations at discharge:   Follow up with PCP in 1-2 weeks  Follow-up with a gastroenterologist in 2-4 weeks Currently recommending to hold Plavix for 1-2 weeks pending evaluation by GI and PCP Currently recommending holding BP meds for next 2-3 days, they resumed once BP stabilizes Continue heart healthy diet  Discharge Diagnoses: Principal Problem:   Acute diverticulitis Active Problems:   Dehydration   Diarrhea   GI bleed   Nausea vomiting and diarrhea   Generalized weakness   Essential hypertension   Mixed hyperlipidemia   Overactive bladder   Insomnia   Abdominal pain   Hiatal hernia  Resolved Problems:   * No resolved hospital problems. *  Hospital Course: Sarah Page is a 81 y.o. female with medical history significant of chronic diarrhea, history of diverticulitis, hypertension, hyperlipidemia, depression, overactive bladder who presents to the emergency department due to several days of nausea, vomiting and diarrhea.  She had colonoscopy on 9/5 with Dr. Levon Hedger which showed: - Hemorrhoids found on perianal exam. - Diverticulosis in the entire examined colon. - The entire examined colon is normal. Biopsied. - Tortuous colon. - The distal rectum and anal verge are normal on retroflexion view.   Patient endorsed about 6 months of chronic diarrhea which was thought to be related to diet, so, patient has been selective over the last couple of months on food eaten and thereby avoiding foods that provoke the diarrhea. She complained of having coffee-ground emesis at home and has noted black stool within the last 2 days, she developed chest heaviness last evening, this was associated with abdominal cramping/discomfort.  She has also had some  diarrhea which was loose to watery.  She denied eating out, fever, chills, having any sick contacts.   ED Course:  Hemodynamically stable, work-up in the ED showed normocytic anemia, BMP showed sodium 134, potassium 4.3, chloride 93, CO2 29, glucose 131, BUN 31, creatinine 0.95, liver enzymes were normal.  Troponin x1 was negative, FOBT was positive, urinalysis was positive for large leukocytes.  CT abdomen and pelvis with contrast showed: -Reviewed: Diverticulitis-findings suggesting of acute diverticulitis/proctitis -Large hiatal hernia - Stable nodule posterior liver, Stable 4 mm hypodensity in the body of the pancreas, unchanged from MRI abdomen 2020. 6. Aortic atherosclerosis.   IV Zofran was given, Protonix was given and patient was started on empiric IV Zosyn due to presumed acute diverticulitis/proctitis, IV hydration was provided.     Assessment/Plan Present on Admission:  GI bleed  Diarrhea  Acute diverticulitis  Dehydration   Principal Problem:   Acute diverticulitis Active Problems:   Dehydration   Diarrhea   GI bleed   Nausea & vomiting   Generalized weakness   Essential hypertension   Mixed hyperlipidemia   Overactive bladder   Insomnia   Abdominal pain   Nausea, vomiting, abdominal pain and diarrhea in the setting of acute diverticulitis/proctitis -Hemodynamically stable -Continue gentle IV fluid hydration, IV antibiotics -As needed Zofran, morphine, -Clear liquid diet for now -N.p.o.  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  - Await pathology results... To be followed with GI  as an outpatient - Repeat upper endoscopy in 3 months for surveillance.  - Start ferrous sulfate 325 mg qday.    Generalized weakness in the setting of above -Fall precaution, PT/OT Protein  supplement to be provided   GI bleed H/H= 11.8/36.7, this was 11.4/35.3 on 12/21/2021    Latest Ref Rng & Units 01/05/2022    4:24 AM 01/04/2022    9:45 AM 01/04/2022    4:09 AM  CBC  WBC 4.0 - 10.5 K/uL 4.1  5.0  4.6   Hemoglobin 12.0 - 15.0 g/dL 16.0  7.3  7.9   Hematocrit 36.0 - 46.0 % 32.7  24.0  24.8   Platelets 150 - 400 K/uL 167  192  202     Hemoccult was positive Continue IV Protonix 40 twice daily Gastroenterologist  will be consulted -n.p.o. after midnight for possible EGD 01/04/2022 -Currently stable: Her hemoglobin dropped to 7.3 Will transfuse if patient becomes symptomatic 01/04/22 EGD evaluation -found gastroesophageal ulcer likely post bleeding, and 8 other gastric ulcers Hemoglobin drop 7.3>> obtaining consent for 2U PRBC blood transfusion.  Dehydration Continue IV hydration   Essential hypertension (uncontrolled) Continue Azor   Mixed hyperlipidemia Continue colestipol and Zetia   Overactive bladder Continue Myrbetriq   Insomnia Continue melatonin   Other home meds: Os-Cal, vitamin D Consultants: GI Procedures performed: EGD  Disposition: Home Diet recommendation:  Discharge Diet Orders (From admission, onward)     Start     Ordered   01/05/22 0000  Diet - low sodium heart healthy        01/05/22 0745           Cardiac diet DISCHARGE MEDICATION: Allergies as of 01/05/2022   No Known Allergies      Medication List     TAKE these medications    acetaminophen 650 MG CR tablet Commonly known as: TYLENOL Take 1,300 mg by mouth every 8 (eight) hours.   ALIGN PO Take 1 capsule by mouth daily.   amLODipine-olmesartan 5-20 MG tablet Commonly known as: AZOR Take 1 tablet by mouth daily. Start taking on: January 08, 2022 What changed: These instructions start on January 08, 2022. If you are unsure what to do until then, ask your doctor or other care provider.   calcium carbonate 1500 (600 Ca) MG Tabs tablet Commonly known as:  OSCAL Take 600 mg of elemental calcium by mouth in the morning and at bedtime.   clopidogrel 75 MG tablet Commonly known as: PLAVIX Take 1 tablet (75 mg total) by mouth daily. Start taking on: January 11, 2022 What changed: These instructions start on January 11, 2022. If you are unsure what to do until then, ask your doctor or other care provider.   colestipol 1 g tablet Commonly known as: COLESTID Take 2 tablets (2 g total) by mouth daily. Take 4 hours apart from other medicines   ezetimibe 10 MG tablet Commonly known as: ZETIA Take 10 mg by mouth daily.   ferrous sulfate 325 (65 FE) MG tablet Take 1 tablet (325 mg total) by mouth daily.   Gemtesa 75 MG Tabs Generic drug: Vibegron Take 75 mg by mouth daily at 6 (six) AM.   hydroxychloroquine 200 MG tablet Commonly known as: PLAQUENIL Take 200 mg by mouth daily.   leflunomide 10 MG tablet Commonly known as: ARAVA Take 10 mg by mouth daily.   Melatonin 10 MG Tabs Take 10 mg by mouth at bedtime.   pantoprazole 40 MG tablet Commonly known as:  Protonix Take 1 tablet (40 mg total) by mouth daily.   polyvinyl alcohol 1.4 % ophthalmic solution Commonly known as: LIQUIFILM TEARS Place 1 drop into both eyes as needed for dry eyes.   sertraline 25 MG tablet Commonly known as: ZOLOFT Take 25 mg by mouth daily.   solifenacin 5 MG tablet Commonly known as: VESICARE Take 5 mg by mouth daily.   VITAMIN B 12 PO Take 1,000 mg by mouth daily.   Vitamin D 50 MCG (2000 UT) Caps Take 2,000 Units by mouth daily.        Discharge Exam: Filed Weights   01/02/22 1914 01/03/22 0051 01/04/22 1318  Weight: 48.5 kg 48.9 kg 48.9 kg      Physical Exam:   General:  AAO x 3,  cooperative, no distress;   HEENT:  Normocephalic, PERRL, otherwise with in Normal limits   Neuro:  CNII-XII intact. , normal motor and sensation, reflexes intact   Lungs:   Clear to auscultation BL, Respirations unlabored,  No wheezes / crackles   Cardio:    S1/S2, RRR, No murmure, No Rubs or Gallops   Abdomen:  Soft, non-tender, bowel sounds active all four quadrants, no guarding or peritoneal signs.  Muscular  skeletal:  Limited exam -global generalized weaknesses - in bed, able to move all 4 extremities,   2+ pulses,  symmetric, No pitting edema  Skin:  Dry, warm to touch, negative for any Rashes,  Wounds: Please see nursing documentation          Condition at discharge: good  The results of significant diagnostics from this hospitalization (including imaging, microbiology, ancillary and laboratory) are listed below for reference.   Imaging Studies: CT Abdomen Pelvis W Contrast  Result Date: 01/02/2022 CLINICAL DATA:  Nausea and vomiting.  Recent colonoscopy. EXAM: CT ABDOMEN AND PELVIS WITH CONTRAST TECHNIQUE: Multidetector CT imaging of the abdomen and pelvis was performed using the standard protocol following bolus administration of intravenous contrast. RADIATION DOSE REDUCTION: This exam was performed according to the departmental dose-optimization program which includes automated exposure control, adjustment of the mA and/or kV according to patient size and/or use of iterative reconstruction technique. CONTRAST:  OMNIPAQUE IOHEXOL 300 MG/ML  SOLN COMPARISON:  04/04/2018, 10/22/2018. FINDINGS: Lower chest: There is a large hiatal hernia with atelectasis at the left lung base. Hepatobiliary: The previously described hypodense mass in the left lobe of the liver is not visualized on this exam. Mild fatty infiltration of the liver. A stable nodule is noted along the posterior aspect of the liver measuring 7 mm. No gallstones, gallbladder wall thickening, or biliary dilatation. Pancreas: There is a 4 mm hypodensity in the body of the pancreas. No pancreatic ductal dilatation. No surrounding inflammatory changes. Spleen: Normal in size without focal abnormality. Adrenals/Urinary Tract: No adrenal nodule or mass. The kidneys  enhance symmetrically. No renal calculus bilaterally. There is mild fullness of the renal collecting systems bilaterally to the level of the pelvis with no obstructing calculus visualized. The bladder is unremarkable. Stomach/Bowel: There is a large hiatal hernia. Thickening of the walls of the gastric antrum is noted and there is enhancement of the gastric mucosa. No bowel obstruction, free air, or pneumatosis. Scattered diverticula are present along the colon. There is marked colonic wall thickening with surrounding fat stranding at the rectosigmoid colon compatible with diverticulitis. Normal appendix is seen in the right lower quadrant. Vascular/Lymphatic: Aortic atherosclerosis. No enlarged abdominal or pelvic lymph nodes. Reproductive: Uterus and bilateral adnexa are unremarkable. Other:  No abdominopelvic ascites. Musculoskeletal: Degenerative changes in the thoracolumbar spine. No acute osseous abnormality. IMPRESSION: 1. Colonic diverticulosis with marked thickening of the walls of the sigmoid colon and rectum with surrounding fat stranding suggesting acute diverticulitis/proctitis. No abscess or free air. 2. Large hiatal hernia. There is mild thickening of the walls of the gastric antrum with gastric mucosal enhancement possible gastritis. 3. Prominence of the renal collecting systems bilaterally to the level of the pelvis with no evidence obstructive calculus. Findings may be associated with inflammatory changes in the pelvis. 4. Stable nodule in the posterior aspect of the liver. The previously described hypodense region in the left lobe of the liver is not visualized on this exam. 5. Stable 4 mm hypodensity in the body of the pancreas, unchanged from MRI abdomen 2020. 6. Aortic atherosclerosis. Electronically Signed   By: Thornell Sartorius M.D.   On: 01/02/2022 22:50    Microbiology: Results for orders placed or performed in visit on 11/01/21  Ova and parasite examination     Status: None   Collection  Time: 11/02/21  3:52 PM   Specimen: Stool  Result Value Ref Range Status   MICRO NUMBER: [id number]  Final   SPECIMEN QUALITY: Adequate  Final   Source FECES  Final   STATUS: FINAL  Final   CONCENTRATE RESULT: No ova or parasites seen  Final   TRICHROME RESULT: No ova or parasites seen  Final   COMMENT:   Final    Routine Ova and Parasite exam may not detect some parasites that occasionally cause diarrheal illness. Cryptosporidium Antigen and/or Cyclospora and Isospora Exam may be ordered to detect these parasites. One negative sample does not necessarily rule out  the presence of a parasitic infection.  For additional information, please refer to https://education.[facility].com/faq/FAQ203 (This link is being provided for informational/ educational purposes only.)     Labs: CBC: Recent Labs  Lab 01/02/22 1932 01/03/22 0409 01/04/22 0409 01/04/22 0945 01/05/22 0424  WBC 8.8 5.7 4.6 5.0 4.1  HGB 11.8* 9.5* 7.9* 7.3* 10.8*  HCT 36.7 29.5* 24.8* 24.0* 32.7*  MCV 93.9 94.2 96.5 99.6 91.9  PLT 316 215 202 192 167   Basic Metabolic Panel: Recent Labs  Lab 01/02/22 1932 01/03/22 0409 01/04/22 0409 01/05/22 0424  NA 134* 136 138 140  K 4.3 4.1 3.2* 3.2*  CL 93* 107 109 110  CO2 GLUCOSE 131* 107* 96 95  BUN 31* 25* 25* 15  CREATININE 0.95 0.74 0.66 0.65  CALCIUM 9.2 7.8* 8.3* 7.9*  MG  --  1.9  --   --   PHOS  --  2.5  --   --    Liver Function Tests: Recent Labs  Lab 01/02/22 1932 01/03/22 0409  AST 36 24  ALT 27 21  ALKPHOS 57 46  BILITOT 0.9 0.9  PROT 7.7 5.8*  ALBUMIN 4.2 3.2*   CBG: No results for input(s): "GLUCAP" in the last 168 hours.  Discharge time spent: greater than 30 minutes.  Signed: Kendell Bane, MD [facility] 01/05/2022

## 2022-01-05 NOTE — Care Management Important Message (Signed)
Important Message  Patient Details  Name: Sarah Page MRN: [id number] Date of Birth: June 21, 1940   Medicare Important Message Given:  Yes     Corey Harold 01/05/2022, 10:41 AM

## 2022-01-05 NOTE — Progress Notes (Signed)
Nsg Discharge Note  Admit Date:  01/02/2022 Discharge date: 01/05/2022   Sarah Page to be D/C'd Home per MD order.  AVS completed.   Patient/caregiver able to verbalize understanding.  Discharge Medication: Allergies as of 01/05/2022   No Known Allergies      Medication List     TAKE these medications    acetaminophen 650 MG CR tablet Commonly known as: TYLENOL Take 1,300 mg by mouth every 8 (eight) hours.   ALIGN PO Take 1 capsule by mouth daily.   amLODipine-olmesartan 5-20 MG tablet Commonly known as: AZOR Take 1 tablet by mouth daily. Start taking on: January 08, 2022 What changed: These instructions start on January 08, 2022. If you are unsure what to do until then, ask your doctor or other care provider.   calcium carbonate 1500 (600 Ca) MG Tabs tablet Commonly known as: OSCAL Take 600 mg of elemental calcium by mouth in the morning and at bedtime.   clopidogrel 75 MG tablet Commonly known as: PLAVIX Take 1 tablet (75 mg total) by mouth daily. Start taking on: January 11, 2022 What changed: These instructions start on January 11, 2022. If you are unsure what to do until then, ask your doctor or other care provider.   colestipol 1 g tablet Commonly known as: COLESTID Take 2 tablets (2 g total) by mouth daily. Take 4 hours apart from other medicines   ezetimibe 10 MG tablet Commonly known as: ZETIA Take 10 mg by mouth daily.   ferrous sulfate 325 (65 FE) MG tablet Take 1 tablet (325 mg total) by mouth daily.   Gemtesa 75 MG Tabs Generic drug: Vibegron Take 75 mg by mouth daily at 6 (six) AM.   hydroxychloroquine 200 MG tablet Commonly known as: PLAQUENIL Take 200 mg by mouth daily.   leflunomide 10 MG tablet Commonly known as: ARAVA Take 10 mg by mouth daily.   Melatonin 10 MG Tabs Take 10 mg by mouth at bedtime.   pantoprazole 40 MG tablet Commonly known as: Protonix Take 1 tablet (40 mg total) by mouth daily.   polyvinyl  alcohol 1.4 % ophthalmic solution Commonly known as: LIQUIFILM TEARS Place 1 drop into both eyes as needed for dry eyes.   sertraline 25 MG tablet Commonly known as: ZOLOFT Take 25 mg by mouth daily.   solifenacin 5 MG tablet Commonly known as: VESICARE Take 5 mg by mouth daily.   VITAMIN B 12 PO Take 1,000 mg by mouth daily.   Vitamin D 50 MCG (2000 UT) Caps Take 2,000 Units by mouth daily.        Discharge Assessment: Vitals:   01/05/22 0034 01/05/22 0530  BP: (!) 109/58 132/76  Pulse: 83 71  Resp: 18 18  Temp: 98.2 F (36.8 C) 97.9 F (36.6 C)  SpO2: 93% 94%   Skin clean, dry and intact without evidence of skin break down, no evidence of skin tears noted. IV catheter discontinued intact. Site without signs and symptoms of complications - no redness or edema noted at insertion site, patient denies c/o pain - only slight tenderness at site.  Dressing with slight pressure applied.  D/c Instructions-Education: Discharge instructions given to patient/family with verbalized understanding. D/c education completed with patient/family including follow up instructions, medication list, d/c activities limitations if indicated, with other d/c instructions as indicated by MD - patient able to verbalize understanding, all questions fully answered. Patient instructed to return to ED, call 911, or call MD for any changes in  condition.  Patient escorted via WC, and D/C home via private auto.  Verl Dicker, RN 01/05/2022 10:15 AM

## 2022-01-08 LAB — TYPE AND SCREEN
ABO/RH(D): O POS
Antibody Screen: NEGATIVE
Unit division: 0
Unit division: 0
Unit division: 0

## 2022-01-08 LAB — BPAM RBC
Blood Product Expiration Date: 202310222359
Blood Product Expiration Date: 202310222359
Blood Product Expiration Date: 202310222359
ISSUE DATE / TIME: 202309142001
ISSUE DATE / TIME: 202309142231
Unit Type and Rh: 5100
Unit Type and Rh: 5100
Unit Type and Rh: 5100

## 2022-01-08 LAB — SURGICAL PATHOLOGY

## 2022-01-09 ENCOUNTER — Encounter (HOSPITAL_COMMUNITY): Payer: Self-pay | Admitting: Gastroenterology

## 2022-01-09 NOTE — Progress Notes (Unsigned)
Referring Provider: Rosalee Kaufman, * Primary Care Physician:  Rosalee Kaufman, PA-C Primary GI Physician: Dr. Jenetta Downer  No chief complaint on file.   HPI:   Sarah Page is a 81 y.o. 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 She has also been experiencing weight loss in the setting of lack of appetite since early 2023.    Evaluation of diarrhea has included negative celiac serologies. C. difficile, GI path panel, O&P negative in July 2023. TSH nml in May 2023. She noticed improvement in August with coconut and low FODMAP diet, avoiding identified triggers including applesauce, garlic, cheese, and diet pepsi.   Colonoscopy 12/26/2021 external hemorrhoids, diverticulosis in the entire colon, otherwise normal-appearing colonic mucosa biopsied, tortuous colon.  Colon biopsies were benign.  She was started on colestipol.   Admitted 9/12-9/15 after presenting with nausea, vomiting, melena, and coffee ground emesis x 2 days, associated chest heaviness and abdominal cramping/discomfort.  Also with diarrhea.  Noted patient has lost approximately 25 pounds.  She denied GERD, early satiety, postprandial abdominal pain, NSAID use, but reported she just did not have an appetite.  She was found to have normocytic anemia with hemoglobin 11.8, FOBT positive.  CT A/P with contrast with acute diverticulitis/proctitis of the sigmoid colon and rectum, large hiatal hernia with mild thickening of the walls of the gastric antrum with gastric mucosal enhancement.  She was with antibiotics for diverticulitis while inpatient, but no antibiotics at discharge.  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  Pathology with reactive gastropathy, negative for H. Pylori. Recommended PPI twice daily, repeat EGD in 3 months, start oral iron daily.  During admission, hemoglobin declined as low as 7.3.  She received 2 units PRBCs with hemoglobin of 10.8 on day of discharge.    Today:    Past Medical History:  Diagnosis Date   Anemia    Arthritis    Gait disturbance    Rotator cuff tear    DR [facility]   Stroke [facility])    Vision abnormalities     Past Surgical History:  Procedure Laterality Date   BIOPSY  12/26/2021   Procedure: BIOPSY;  Surgeon: Harvel Quale, MD;  Location: [facility];  Service: Gastroenterology;;   COLONOSCOPY WITH PROPOFOL N/A 12/26/2021   Procedure: COLONOSCOPY WITH PROPOFOL;  Surgeon: Harvel Quale, MD;  Location: [facility];  Service: Gastroenterology;  Laterality: N/A;  645  ASA 3   ORIF CLAVICULAR FRACTURE Left     Current Outpatient Medications  Medication Sig Dispense Refill   acetaminophen (TYLENOL) 650 MG CR tablet Take 1,300 mg by mouth every 8 (eight) hours.     amLODipine-olmesartan (AZOR) 5-20 MG tablet Take 1 tablet by mouth daily.     calcium carbonate (OSCAL) 1500 (600 Ca) MG TABS tablet Take 600 mg of elemental calcium by mouth in the morning and at bedtime.     Cholecalciferol (VITAMIN D) 50 MCG (2000 UT) CAPS Take 2,000 Units by mouth daily.     [START ON 01/11/2022] clopidogrel (PLAVIX) 75 MG tablet Take 1 tablet (75 mg total) by mouth daily.     colestipol (COLESTID) 1 g  tablet Take 2 tablets (2 g total) by mouth daily. Take 4 hours apart from other medicines 180 tablet 3   Cyanocobalamin (VITAMIN B 12 PO) Take 1,000 mg by mouth daily.     ezetimibe (ZETIA) 10 MG tablet Take 10 mg by mouth daily.     ferrous sulfate 325 (65 FE) MG tablet Take 1 tablet (325 mg total) by mouth daily. 30 tablet 3   hydroxychloroquine (PLAQUENIL) 200 MG tablet Take 200  mg by mouth daily.     leflunomide (ARAVA) 10 MG tablet Take 10 mg by mouth daily.     Melatonin 10 MG TABS Take 10 mg by mouth at bedtime.     polyvinyl alcohol (LIQUIFILM TEARS) 1.4 % ophthalmic solution Place 1 drop into both eyes as needed for dry eyes.     Probiotic Product (ALIGN PO) Take 1 capsule by mouth daily.     sertraline (ZOLOFT) 25 MG tablet Take 25 mg by mouth daily.     solifenacin (VESICARE) 5 MG tablet Take 5 mg by mouth daily.     Vibegron (GEMTESA) 75 MG TABS Take 75 mg by mouth daily at 6 (six) AM.     Current Facility-Administered Medications  Medication Dose Route Frequency Provider Last Rate Last Admin   pantoprazole (PROTONIX) EC tablet 40 mg  40 mg Oral BID Deatra James, MD        Allergies as of 01/11/2022   (No Known Allergies)    Family History  Problem Relation Age of Onset   Stroke Mother    Transient ischemic attack Mother    Stroke Father    Heart attack Father    Mental illness Brother    Breast cancer Daughter 63       [name]    Social History   Socioeconomic History   Marital status: Widowed    Spouse name: Not on file   Number of children: Not on file   Years of education: Not on file   Highest education level: Not on file  Occupational History   Not on file  Tobacco Use   Smoking status: Never    Passive exposure: Never   Smokeless tobacco: Never  Vaping Use   Vaping Use: Never used  Substance and Sexual Activity   Alcohol use: Yes    Comment: occasional wine   Drug use: Never   Sexual activity: Not on file  Other Topics Concern   Not on file  Social History Narrative   Not on file   Social Determinants of Health   Financial Resource Strain: Not on file  Food Insecurity: No Food Insecurity (01/03/2022)   Hunger Vital Sign    Worried About Running Out of Food in the Last Year: Never true    Ran Out of Food in the Last Year: Never true  Transportation Needs: No Transportation Needs (01/03/2022)   PRAPARE -  Hydrologist (Medical): No    Lack of Transportation (Non-Medical): No  Physical Activity: Not on file  Stress: Not on file  Social Connections: Not on file    Review of Systems: Gen: Denies fever, chills, flulike symptoms, presyncope, syncope. CV: Denies chest pain, palpitations. Resp: Denies dyspnea, cough.  GI: See HPI Derm: Denies rash. Psych: Denies depression, anxiety. Heme: See HPI  Physical Exam: There were no vitals taken for this visit. General:   Alert and oriented. No distress noted. Pleasant and cooperative.  Head:  Normocephalic and atraumatic. Eyes:  Conjuctiva clear without scleral icterus. Heart:  S1, S2 present without murmurs appreciated. Lungs:  Clear to auscultation bilaterally. No wheezes, rales, or rhonchi. No distress.  Abdomen:  +BS, soft, non-tender and non-distended. No rebound or guarding. No HSM or masses noted. Msk:  Symmetrical without gross deformities. Normal posture. Extremities:  Without edema. Neurologic:  Alert and  oriented x4 Psych:  Normal mood and affect.    Assessment:     Plan:  ***   Aliene Altes, PA-C [facility] Gastroenterology 01/11/2022

## 2022-01-11 ENCOUNTER — Encounter: Payer: Self-pay | Admitting: Gastroenterology

## 2022-01-11 ENCOUNTER — Ambulatory Visit (INDEPENDENT_AMBULATORY_CARE_PROVIDER_SITE_OTHER): Payer: Medicare PPO | Admitting: Gastroenterology

## 2022-01-11 VITALS — BP 175/80 | HR 92 | Temp 97.9°F | Ht 64.0 in | Wt 107.8 lb

## 2022-01-11 DIAGNOSIS — D649 Anemia, unspecified: Secondary | ICD-10-CM

## 2022-01-11 DIAGNOSIS — R197 Diarrhea, unspecified: Secondary | ICD-10-CM

## 2022-01-11 DIAGNOSIS — Z8711 Personal history of peptic ulcer disease: Secondary | ICD-10-CM | POA: Diagnosis not present

## 2022-01-11 DIAGNOSIS — R634 Abnormal weight loss: Secondary | ICD-10-CM

## 2022-01-11 NOTE — Patient Instructions (Signed)
Take pantoprazole 40 mg twice daily 30 minutes before breakfast and dinner.  Continue to take iron daily.  Continue to avoid all NSAID products including ibuprofen, Aleve, Advil, BC powders, Goody powders, and anything that says "NSAID" on the package.  You should have your hemoglobin rechecked next week when you see your primary care doctor.  Please have them send the results to our office.  You may talk with your primary care doctor to see if Plavix is necessary for you to be on indefinitely.  I do not see any contraindications to this from a GI standpoint at this time.  Continue to avoid known dietary triggers that cause diarrhea.  No need to take colestipol as you are not having diarrhea at this time.  To help with weight loss: Try to be more intentional with increasing your daily calorie intake. Try eating 4-6 small meals daily rather than 3 main meals. Drink 2 boost supplements daily. Keep a close eye on your weight at home.  If you lose an additional 5 pounds, please let us know.  We will plan to follow-up with you in 3 months to discuss scheduling repeat upper endoscopy to follow-up on the ulcers in your stomach.  Do not hesitate to call if you have any questions or concerns prior to your next visit.  It was very nice to meet you today!  Aliene Altes, PA-C [facility] Gastroenterology

## 2022-01-19 ENCOUNTER — Telehealth: Payer: Self-pay | Admitting: Gastroenterology

## 2022-01-19 NOTE — Telephone Encounter (Signed)
Sarah Page, please let patient know I have received and reviewed her labs completed with PCP on 9/26. Her hemoglobin has improved to 12.7 from 10.8, which is great.  Continue with recommendations provided at office visit.

## 2022-01-19 NOTE — Telephone Encounter (Signed)
Spoke to pt, informed her of results and recommendations. Pt voiced understanding.  

## 2022-01-19 NOTE — Telephone Encounter (Signed)
LMOM for pt to call office  

## 2022-01-25 ENCOUNTER — Encounter (INDEPENDENT_AMBULATORY_CARE_PROVIDER_SITE_OTHER): Payer: Self-pay

## 2022-01-25 NOTE — Telephone Encounter (Signed)
SURGICAL PATHOLOGY SURGICAL PATHOLOGY  CASE: [id number]  PATIENT: Sarah Page  Surgical Pathology Report      Clinical History: melena coffee ground emesis, abnormal CT of gastric  wall      FINAL MICROSCOPIC DIAGNOSIS:   A. STOMACH, BIOPSY:  - Gastric antral and oxyntic mucosa with features of reactive  gastropathy  - Negative for H. pylori on HE stain  - Negative for intestinal metaplasia or malignancy

## 2022-02-06 ENCOUNTER — Telehealth (INDEPENDENT_AMBULATORY_CARE_PROVIDER_SITE_OTHER): Payer: Self-pay

## 2022-02-06 NOTE — Telephone Encounter (Signed)
This is not a common side effect with this medication. If she wishes, we can try cholestyramine

## 2022-02-06 NOTE — Telephone Encounter (Signed)
Patient had a TCS done in September by Dr. Jenetta Downer she says she was prescribed colestipol 1 gram. The two times she has taken it she says she has noticed that she had more night time frequency in urinating. Please advised if there is something else she can take, or if this is a side effect of the medication.

## 2022-02-07 NOTE — Telephone Encounter (Signed)
I called and left a message asked that the patient please return call.  

## 2022-02-08 NOTE — Telephone Encounter (Signed)
I called and left a detailed message regarding there not being any side effects from colestipol with increased urination asked that the patient please return call if she would like [place] to send in cholestyramine and to which pharmacy we send it to.

## 2022-02-12 NOTE — Telephone Encounter (Signed)
Patient called back and left voicemail. I called patient to discuss and she does not want to try cholestyramine at this time and will call back if she changes her mind or has further concerns.

## 2022-02-22 DIAGNOSIS — R262 Difficulty in walking, not elsewhere classified: Secondary | ICD-10-CM | POA: Diagnosis not present

## 2022-02-22 DIAGNOSIS — R2689 Other abnormalities of gait and mobility: Secondary | ICD-10-CM | POA: Diagnosis not present

## 2022-02-22 DIAGNOSIS — M545 Low back pain, unspecified: Secondary | ICD-10-CM | POA: Diagnosis not present

## 2022-02-22 DIAGNOSIS — Z789 Other specified health status: Secondary | ICD-10-CM | POA: Diagnosis not present

## 2022-02-22 DIAGNOSIS — Z9181 History of falling: Secondary | ICD-10-CM | POA: Diagnosis not present

## 2022-02-22 DIAGNOSIS — R531 Weakness: Secondary | ICD-10-CM | POA: Diagnosis not present

## 2022-02-27 DIAGNOSIS — R531 Weakness: Secondary | ICD-10-CM | POA: Diagnosis not present

## 2022-02-27 DIAGNOSIS — Z789 Other specified health status: Secondary | ICD-10-CM | POA: Diagnosis not present

## 2022-02-27 DIAGNOSIS — Z9181 History of falling: Secondary | ICD-10-CM | POA: Diagnosis not present

## 2022-02-27 DIAGNOSIS — R2689 Other abnormalities of gait and mobility: Secondary | ICD-10-CM | POA: Diagnosis not present

## 2022-02-27 DIAGNOSIS — M545 Low back pain, unspecified: Secondary | ICD-10-CM | POA: Diagnosis not present

## 2022-02-27 DIAGNOSIS — R262 Difficulty in walking, not elsewhere classified: Secondary | ICD-10-CM | POA: Diagnosis not present

## 2022-03-01 DIAGNOSIS — Z9181 History of falling: Secondary | ICD-10-CM | POA: Diagnosis not present

## 2022-03-01 DIAGNOSIS — R262 Difficulty in walking, not elsewhere classified: Secondary | ICD-10-CM | POA: Diagnosis not present

## 2022-03-01 DIAGNOSIS — Z789 Other specified health status: Secondary | ICD-10-CM | POA: Diagnosis not present

## 2022-03-01 DIAGNOSIS — R2689 Other abnormalities of gait and mobility: Secondary | ICD-10-CM | POA: Diagnosis not present

## 2022-03-01 DIAGNOSIS — M545 Low back pain, unspecified: Secondary | ICD-10-CM | POA: Diagnosis not present

## 2022-03-01 DIAGNOSIS — R531 Weakness: Secondary | ICD-10-CM | POA: Diagnosis not present

## 2022-03-06 DIAGNOSIS — M545 Low back pain, unspecified: Secondary | ICD-10-CM | POA: Diagnosis not present

## 2022-03-06 DIAGNOSIS — R2689 Other abnormalities of gait and mobility: Secondary | ICD-10-CM | POA: Diagnosis not present

## 2022-03-06 DIAGNOSIS — R262 Difficulty in walking, not elsewhere classified: Secondary | ICD-10-CM | POA: Diagnosis not present

## 2022-03-06 DIAGNOSIS — Z9181 History of falling: Secondary | ICD-10-CM | POA: Diagnosis not present

## 2022-03-06 DIAGNOSIS — Z789 Other specified health status: Secondary | ICD-10-CM | POA: Diagnosis not present

## 2022-03-06 DIAGNOSIS — R531 Weakness: Secondary | ICD-10-CM | POA: Diagnosis not present

## 2022-03-08 DIAGNOSIS — R262 Difficulty in walking, not elsewhere classified: Secondary | ICD-10-CM | POA: Diagnosis not present

## 2022-03-08 DIAGNOSIS — Z789 Other specified health status: Secondary | ICD-10-CM | POA: Diagnosis not present

## 2022-03-08 DIAGNOSIS — R2689 Other abnormalities of gait and mobility: Secondary | ICD-10-CM | POA: Diagnosis not present

## 2022-03-08 DIAGNOSIS — Z9181 History of falling: Secondary | ICD-10-CM | POA: Diagnosis not present

## 2022-03-08 DIAGNOSIS — R531 Weakness: Secondary | ICD-10-CM | POA: Diagnosis not present

## 2022-03-08 DIAGNOSIS — M545 Low back pain, unspecified: Secondary | ICD-10-CM | POA: Diagnosis not present

## 2022-03-13 DIAGNOSIS — R2689 Other abnormalities of gait and mobility: Secondary | ICD-10-CM | POA: Diagnosis not present

## 2022-03-13 DIAGNOSIS — Z789 Other specified health status: Secondary | ICD-10-CM | POA: Diagnosis not present

## 2022-03-13 DIAGNOSIS — R262 Difficulty in walking, not elsewhere classified: Secondary | ICD-10-CM | POA: Diagnosis not present

## 2022-03-13 DIAGNOSIS — R531 Weakness: Secondary | ICD-10-CM | POA: Diagnosis not present

## 2022-03-13 DIAGNOSIS — Z9181 History of falling: Secondary | ICD-10-CM | POA: Diagnosis not present

## 2022-03-13 DIAGNOSIS — M545 Low back pain, unspecified: Secondary | ICD-10-CM | POA: Diagnosis not present

## 2022-03-20 DIAGNOSIS — R262 Difficulty in walking, not elsewhere classified: Secondary | ICD-10-CM | POA: Diagnosis not present

## 2022-03-20 DIAGNOSIS — R2689 Other abnormalities of gait and mobility: Secondary | ICD-10-CM | POA: Diagnosis not present

## 2022-03-20 DIAGNOSIS — Z9181 History of falling: Secondary | ICD-10-CM | POA: Diagnosis not present

## 2022-03-20 DIAGNOSIS — R531 Weakness: Secondary | ICD-10-CM | POA: Diagnosis not present

## 2022-03-20 DIAGNOSIS — Z789 Other specified health status: Secondary | ICD-10-CM | POA: Diagnosis not present

## 2022-03-20 DIAGNOSIS — M545 Low back pain, unspecified: Secondary | ICD-10-CM | POA: Diagnosis not present

## 2022-04-06 DIAGNOSIS — R5383 Other fatigue: Secondary | ICD-10-CM | POA: Diagnosis not present

## 2022-04-06 DIAGNOSIS — E7849 Other hyperlipidemia: Secondary | ICD-10-CM | POA: Diagnosis not present

## 2022-04-06 DIAGNOSIS — E559 Vitamin D deficiency, unspecified: Secondary | ICD-10-CM | POA: Diagnosis not present

## 2022-04-06 DIAGNOSIS — E039 Hypothyroidism, unspecified: Secondary | ICD-10-CM | POA: Diagnosis not present

## 2022-04-06 DIAGNOSIS — R531 Weakness: Secondary | ICD-10-CM | POA: Diagnosis not present

## 2022-04-06 DIAGNOSIS — R7303 Prediabetes: Secondary | ICD-10-CM | POA: Diagnosis not present

## 2022-04-06 DIAGNOSIS — D509 Iron deficiency anemia, unspecified: Secondary | ICD-10-CM | POA: Diagnosis not present

## 2022-04-11 ENCOUNTER — Encounter (INDEPENDENT_AMBULATORY_CARE_PROVIDER_SITE_OTHER): Payer: Self-pay | Admitting: *Deleted

## 2022-04-11 DIAGNOSIS — M069 Rheumatoid arthritis, unspecified: Secondary | ICD-10-CM | POA: Diagnosis not present

## 2022-04-11 DIAGNOSIS — E7849 Other hyperlipidemia: Secondary | ICD-10-CM | POA: Diagnosis not present

## 2022-04-11 DIAGNOSIS — Z789 Other specified health status: Secondary | ICD-10-CM | POA: Diagnosis not present

## 2022-04-11 DIAGNOSIS — K449 Diaphragmatic hernia without obstruction or gangrene: Secondary | ICD-10-CM | POA: Diagnosis not present

## 2022-04-11 DIAGNOSIS — D509 Iron deficiency anemia, unspecified: Secondary | ICD-10-CM | POA: Diagnosis not present

## 2022-04-11 DIAGNOSIS — N1831 Chronic kidney disease, stage 3a: Secondary | ICD-10-CM | POA: Diagnosis not present

## 2022-04-11 DIAGNOSIS — M159 Polyosteoarthritis, unspecified: Secondary | ICD-10-CM | POA: Diagnosis not present

## 2022-04-11 DIAGNOSIS — R7303 Prediabetes: Secondary | ICD-10-CM | POA: Diagnosis not present

## 2022-04-11 DIAGNOSIS — R634 Abnormal weight loss: Secondary | ICD-10-CM | POA: Diagnosis not present

## 2022-04-30 ENCOUNTER — Telehealth (INDEPENDENT_AMBULATORY_CARE_PROVIDER_SITE_OTHER): Payer: Self-pay | Admitting: *Deleted

## 2022-04-30 NOTE — Telephone Encounter (Signed)
Referring MD/PCP: Dr. Pleas Koch  Procedure: EGD  Is patient diabetic? If yes, Type 1 or Type 2   no      Does patient have prosthetic heart valve or mechanical valve?  no  Do you have a pacemaker/defibrillator?  no  Has patient ever had endocarditis/atrial fibrillation? no  Does patient use oxygen? no  Has patient had joint replacement within last 12 months?  no  Does patient have a history of alcohol/drug use?  no  Have you had a stroke/heart attack last 6 mths? no  Do you take medicine for weight loss?  no  For female patients                     do you still have your menstrual cycle? no  Is patient on blood thinner such as Coumadin, Plavix and/or Aspirin? Aspirin 81mg  daily  Medications:  Current Outpatient Medications on File Prior to Visit  Medication Sig Dispense Refill   amLODipine-olmesartan (AZOR) 5-20 MG tablet Take 1 tablet by mouth daily.     aspirin EC 81 MG tablet Take 81 mg by mouth daily. Swallow whole.     ezetimibe (ZETIA) 10 MG tablet Take 10 mg by mouth daily.     hydroxychloroquine (PLAQUENIL) 200 MG tablet Take 200 mg by mouth daily.     leflunomide (ARAVA) 10 MG tablet Take 10 mg by mouth daily.     pantoprazole (PROTONIX) 40 MG tablet Take 40 mg by mouth daily.     sertraline (ZOLOFT) 25 MG tablet Take 25 mg by mouth daily.     solifenacin (VESICARE) 5 MG tablet Take 5 mg by mouth daily.     Vibegron (GEMTESA) 75 MG TABS Take 75 mg by mouth daily at 6 (six) AM.     No current facility-administered medications on file prior to visit.     Allergies: No Known Allergies

## 2022-05-01 NOTE — Telephone Encounter (Signed)
Room 3 Thanks 

## 2022-05-02 ENCOUNTER — Telehealth (INDEPENDENT_AMBULATORY_CARE_PROVIDER_SITE_OTHER): Payer: Self-pay

## 2022-05-02 NOTE — Telephone Encounter (Signed)
LMOVM to call back to schedule EGD

## 2022-05-02 NOTE — Telephone Encounter (Signed)
Spoke with pt. Scheduled for 2/2 at 10:45am. Aware will send instructions/pre-op appt.   PA done via cohere. Auth# [id number] , DOS: 05/25/2022 - 07/24/2022

## 2022-05-02 NOTE — Telephone Encounter (Signed)
Pt returned call

## 2022-05-03 ENCOUNTER — Telehealth (INDEPENDENT_AMBULATORY_CARE_PROVIDER_SITE_OTHER): Payer: Self-pay | Admitting: *Deleted

## 2022-05-03 ENCOUNTER — Encounter: Payer: Self-pay | Admitting: *Deleted

## 2022-05-03 NOTE — Telephone Encounter (Signed)
Patient left voicemail that she thought the date scheduled for her endoscopy would be good ( February 2nd) after talking with daughter she will  not be able to bring her and would like to reschedule.   [phone]

## 2022-05-03 NOTE — Telephone Encounter (Signed)
LMTRC

## 2022-05-04 ENCOUNTER — Encounter: Payer: Self-pay | Admitting: *Deleted

## 2022-05-04 NOTE — Telephone Encounter (Signed)
Pt has been rescheduled until 06/05/22 at 1:30 pm. New  Instructions mailed to pt with new appt date.

## 2022-05-04 NOTE — Telephone Encounter (Signed)
Questionnaire from recall,no referral needed 

## 2022-05-21 DIAGNOSIS — Z79899 Other long term (current) drug therapy: Secondary | ICD-10-CM | POA: Diagnosis not present

## 2022-05-21 DIAGNOSIS — M0609 Rheumatoid arthritis without rheumatoid factor, multiple sites: Secondary | ICD-10-CM | POA: Diagnosis not present

## 2022-05-21 DIAGNOSIS — Z681 Body mass index (BMI) 19 or less, adult: Secondary | ICD-10-CM | POA: Diagnosis not present

## 2022-05-21 DIAGNOSIS — M1991 Primary osteoarthritis, unspecified site: Secondary | ICD-10-CM | POA: Diagnosis not present

## 2022-05-23 ENCOUNTER — Other Ambulatory Visit (HOSPITAL_COMMUNITY): Payer: Medicare PPO

## 2022-05-30 NOTE — Patient Instructions (Signed)
Sarah Page  05/30/2022     @PREFPERIOPPHARMACY @   Your procedure is scheduled on  06/05/2022.   Report to [facility] at  1130  A.M.   Call this number if you have problems the morning of surgery:  [phone]  If you experience any cold or flu symptoms such as cough, fever, chills, shortness of breath, etc. between now and your scheduled surgery, please notify us at the above number.   Remember:  Follow the diet instructions given to you by the office.     Take these medicines the morning of surgery with A SIP OF WATER             arava, pantoprazole, zoloft, vesicare,gemtesa.     Do not wear jewelry, make-up or nail polish.  Do not wear lotions, powders, or perfumes, or deodorant.  Do not shave 48 hours prior to surgery.  Men may shave face and neck.  Do not bring valuables to the hospital.  [facility] is not responsible for any belongings or valuables.  Contacts, dentures or bridgework may not be worn into surgery.  Leave your suitcase in the car.  After surgery it may be brought to your room.  For patients admitted to the hospital, discharge time will be determined by your treatment team.  Patients discharged the day of surgery will not be allowed to drive home and must have someone with them for 24 hours.    Special instructions:   DO NOT smoke tobacco or vape for 24 hours before your procedure.  Please read over the following fact sheets that you were given. Anesthesia Post-op Instructions and Care and Recovery After Surgery      Upper Endoscopy, Adult, Care After After the procedure, it is common to have a sore throat. It is also common to have: Mild stomach pain or discomfort. Bloating. Nausea. Follow these instructions at home: The instructions below may help you care for yourself at home. Your health care provider may give you more instructions. If you have questions, ask your health care provider. If you were given a sedative during the  procedure, it can affect you for several hours. Do not drive or operate machinery until your health care provider says that it is safe. If you will be going home right after the procedure, plan to have a responsible adult: Take you home from the hospital or clinic. You will not be allowed to drive. Care for you for the time you are told. Follow instructions from your health care provider about what you may eat and drink. Return to your normal activities as told by your health care provider. Ask your health care provider what activities are safe for you. Take over-the-counter and prescription medicines only as told by your health care provider. Contact a health care provider if you: Have a sore throat that lasts longer than one day. Have trouble swallowing. Have a fever. Get help right away if you: Vomit blood or your vomit looks like coffee grounds. Have bloody, black, or tarry stools. Have a very bad sore throat or you cannot swallow. Have difficulty breathing or very bad pain in your chest or abdomen. These symptoms may be an emergency. Get help right away. Call 911. Do not wait to see if the symptoms will go away. Do not drive yourself to the hospital. Summary After the procedure, it is common to have a sore throat, mild stomach discomfort, bloating, and nausea. If you were  given a sedative during the procedure, it can affect you for several hours. Do not drive until your health care provider says that it is safe. Follow instructions from your health care provider about what you may eat and drink. Return to your normal activities as told by your health care provider. This information is not intended to replace advice given to you by your health care provider. Make sure you discuss any questions you have with your health care provider. Document Revised: 07/19/2021 Document Reviewed: 07/19/2021 Elsevier Patient Education  [place] After The  following information offers guidance on how to care for yourself after your procedure. Your health care provider may also give you more specific instructions. If you have problems or questions, contact your health care provider. What can I expect after the procedure? After the procedure, it is common to have: Tiredness. Little or no memory about what happened during or after the procedure. Impaired judgment when it comes to making decisions. Nausea or vomiting. Some trouble with balance. Follow these instructions at home: For the time period you were told by your health care provider:  Rest. Do not participate in activities where you could fall or become injured. Do not drive or use machinery. Do not drink alcohol. Do not take sleeping pills or medicines that cause drowsiness. Do not make important decisions or sign legal documents. Do not take care of children on your own. Medicines Take over-the-counter and prescription medicines only as told by your health care provider. If you were prescribed antibiotics, take them as told by your health care provider. Do not stop using the antibiotic even if you start to feel better. Eating and drinking Follow instructions from your health care provider about what you may eat and drink. Drink enough fluid to keep your urine pale yellow. If you vomit: Drink clear fluids slowly and in small amounts as you are able. Clear fluids include water, ice chips, low-calorie sports drinks, and fruit juice that has water added to it (diluted fruit juice). Eat light and bland foods in small amounts as you are able. These foods include bananas, applesauce, rice, lean meats, toast, and crackers. General instructions  Have a responsible adult stay with you for the time you are told. It is important to have someone help care for you until you are awake and alert. If you have sleep apnea, surgery and some medicines can increase your risk for breathing problems.  Follow instructions from your health care provider about wearing your sleep device: When you are sleeping. This includes during daytime naps. While taking prescription pain medicines, sleeping medicines, or medicines that make you drowsy. Do not use any products that contain nicotine or tobacco. These products include cigarettes, chewing tobacco, and vaping devices, such as e-cigarettes. If you need help quitting, ask your health care provider. Contact a health care provider if: You feel nauseous or vomit every time you eat or drink. You feel light-headed. You are still sleepy or having trouble with balance after 24 hours. You get a rash. You have a fever. You have redness or swelling around the IV site. Get help right away if: You have trouble breathing. You have new confusion after you get home. These symptoms may be an emergency. Get help right away. Call 911. Do not wait to see if the symptoms will go away. Do not drive yourself to the hospital. This information is not intended to replace advice given to you by your health care  provider. Make sure you discuss any questions you have with your health care provider. Document Revised: 09/04/2021 Document Reviewed: 09/04/2021 Elsevier Patient Education  [place].

## 2022-05-31 ENCOUNTER — Other Ambulatory Visit: Payer: Self-pay

## 2022-05-31 ENCOUNTER — Encounter (HOSPITAL_COMMUNITY): Payer: Self-pay

## 2022-05-31 ENCOUNTER — Encounter (HOSPITAL_COMMUNITY)
Admission: RE | Admit: 2022-05-31 | Discharge: 2022-05-31 | Disposition: A | Discharge status: Home / Self Care | Payer: Medicare PPO | Source: Ambulatory Visit | Attending: Gastroenterology | Admitting: Gastroenterology

## 2022-05-31 VITALS — BP 164/79 | HR 90 | Temp 97.6°F | Resp 18 | Ht 64.0 in | Wt 107.8 lb

## 2022-05-31 DIAGNOSIS — Z01812 Encounter for preprocedural laboratory examination: Secondary | ICD-10-CM | POA: Diagnosis not present

## 2022-05-31 DIAGNOSIS — D649 Anemia, unspecified: Secondary | ICD-10-CM | POA: Insufficient documentation

## 2022-05-31 LAB — CBC WITH DIFFERENTIAL/PLATELET
Abs Immature Granulocytes: 0.01 10*3/uL (ref 0.00–0.07)
Basophils Absolute: 0.1 10*3/uL (ref 0.0–0.1)
Basophils Relative: 2 %
Eosinophils Absolute: 0.3 10*3/uL (ref 0.0–0.5)
Eosinophils Relative: 6 %
HCT: 38.9 % (ref 36.0–46.0)
Hemoglobin: 12.1 g/dL (ref 12.0–15.0)
Immature Granulocytes: 0 %
Lymphocytes Relative: 14 %
Lymphs Abs: 0.6 10*3/uL — ABNORMAL LOW (ref 0.7–4.0)
MCH: 30.3 pg (ref 26.0–34.0)
MCHC: 31.1 g/dL (ref 30.0–36.0)
MCV: 97.5 fL (ref 80.0–100.0)
Monocytes Absolute: 0.3 10*3/uL (ref 0.1–1.0)
Monocytes Relative: 9 %
Neutro Abs: 2.7 10*3/uL (ref 1.7–7.7)
Neutrophils Relative %: 69 %
Platelets: 216 10*3/uL (ref 150–400)
RBC: 3.99 MIL/uL (ref 3.87–5.11)
RDW: 12.6 % (ref 11.5–15.5)
WBC: 3.9 10*3/uL — ABNORMAL LOW (ref 4.0–10.5)
nRBC: 0 % (ref 0.0–0.2)

## 2022-06-05 ENCOUNTER — Ambulatory Visit (HOSPITAL_BASED_OUTPATIENT_CLINIC_OR_DEPARTMENT_OTHER): Payer: Medicare PPO | Admitting: Anesthesiology

## 2022-06-05 ENCOUNTER — Encounter (HOSPITAL_COMMUNITY)
Admission: RE | Disposition: A | Discharge status: Home / Self Care | Payer: Self-pay | Source: Home / Self Care | Attending: Gastroenterology

## 2022-06-05 ENCOUNTER — Encounter (HOSPITAL_COMMUNITY): Payer: Self-pay | Admitting: Gastroenterology

## 2022-06-05 ENCOUNTER — Ambulatory Visit (HOSPITAL_COMMUNITY)
Admission: RE | Admit: 2022-06-05 | Discharge: 2022-06-05 | Disposition: A | Discharge status: Home / Self Care | Payer: Medicare PPO | Attending: Gastroenterology | Admitting: Gastroenterology

## 2022-06-05 ENCOUNTER — Ambulatory Visit (HOSPITAL_COMMUNITY): Payer: Medicare PPO | Admitting: Anesthesiology

## 2022-06-05 DIAGNOSIS — I10 Essential (primary) hypertension: Secondary | ICD-10-CM | POA: Diagnosis not present

## 2022-06-05 DIAGNOSIS — K259 Gastric ulcer, unspecified as acute or chronic, without hemorrhage or perforation: Secondary | ICD-10-CM | POA: Diagnosis not present

## 2022-06-05 DIAGNOSIS — M1990 Unspecified osteoarthritis, unspecified site: Secondary | ICD-10-CM | POA: Insufficient documentation

## 2022-06-05 DIAGNOSIS — Z7902 Long term (current) use of antithrombotics/antiplatelets: Secondary | ICD-10-CM | POA: Diagnosis not present

## 2022-06-05 DIAGNOSIS — K253 Acute gastric ulcer without hemorrhage or perforation: Secondary | ICD-10-CM

## 2022-06-05 DIAGNOSIS — K449 Diaphragmatic hernia without obstruction or gangrene: Secondary | ICD-10-CM

## 2022-06-05 DIAGNOSIS — D649 Anemia, unspecified: Secondary | ICD-10-CM | POA: Insufficient documentation

## 2022-06-05 DIAGNOSIS — Z8673 Personal history of transient ischemic attack (TIA), and cerebral infarction without residual deficits: Secondary | ICD-10-CM | POA: Insufficient documentation

## 2022-06-05 DIAGNOSIS — Z09 Encounter for follow-up examination after completed treatment for conditions other than malignant neoplasm: Secondary | ICD-10-CM | POA: Diagnosis present

## 2022-06-05 HISTORY — PX: BIOPSY: SHX5522

## 2022-06-05 HISTORY — PX: ESOPHAGOGASTRODUODENOSCOPY (EGD) WITH PROPOFOL: SHX5813

## 2022-06-05 LAB — IRON AND TIBC
Iron: 26 ug/dL — ABNORMAL LOW (ref 28–170)
Saturation Ratios: 8 % — ABNORMAL LOW (ref 10.4–31.8)
TIBC: 308 ug/dL (ref 250–450)
UIBC: 282 ug/dL

## 2022-06-05 LAB — FERRITIN: Ferritin: 139 ng/mL (ref 11–307)

## 2022-06-05 SURGERY — ESOPHAGOGASTRODUODENOSCOPY (EGD) WITH PROPOFOL
Anesthesia: General

## 2022-06-05 MED ORDER — LIDOCAINE HCL (CARDIAC) PF 100 MG/5ML IV SOSY
PREFILLED_SYRINGE | INTRAVENOUS | Status: DC | PRN
  Administered 2022-06-05: 80 mg via INTRATRACHEAL

## 2022-06-05 MED ORDER — PROPOFOL 10 MG/ML IV BOLUS
INTRAVENOUS | Status: DC | PRN
  Administered 2022-06-05: 30 mg via INTRAVENOUS
  Administered 2022-06-05: 50 mg via INTRAVENOUS

## 2022-06-05 MED ORDER — PROPOFOL 500 MG/50ML IV EMUL
INTRAVENOUS | Status: DC | PRN
  Administered 2022-06-05: 100 ug/kg/min via INTRAVENOUS

## 2022-06-05 MED ORDER — PANTOPRAZOLE SODIUM 40 MG PO TBEC: 40.0000 mg | DELAYED_RELEASE_TABLET | Freq: Two times a day (BID) | ORAL | 3 refills | Status: DC

## 2022-06-05 MED ORDER — LACTATED RINGERS IV SOLN: INTRAVENOUS | Status: DC

## 2022-06-05 MED ORDER — LACTATED RINGERS IV SOLN: INTRAVENOUS | Status: DC | PRN

## 2022-06-05 NOTE — H&P (Signed)
Sarah Page is an 82 y.o. female.   Chief Complaint: follow up gastric ulcers HPI: 82 year old female with history of anemia, arthritis, previous stroke chronically on Plavix, coming for follow-up of gastric ulcers.  The patient denies having any nausea, vomiting, fever, chills, hematochezia, melena, hematemesis, abdominal distention, abdominal pain, diarrhea, jaundice, pruritus or weight loss.  She has been taking PPI compliantly.  Has only been taking Tylenol for pain.  Past Medical History:  Diagnosis Date   Anemia    Arthritis    Gait disturbance    Rotator cuff tear    [facility]   Stroke [facility])    Vision abnormalities     Past Surgical History:  Procedure Laterality Date   BIOPSY  12/26/2021   Procedure: BIOPSY;  Surgeon: Harvel Quale, MD;  [facility];  Service: Gastroenterology;;   BIOPSY  01/04/2022   Procedure: BIOPSY;  Surgeon: Harvel Quale, MD;  [facility];  Service: Gastroenterology;;   COLONOSCOPY WITH PROPOFOL N/A 12/26/2021   Procedure: COLONOSCOPY WITH PROPOFOL;  Surgeon: Harvel Quale, MD;  [facility];  Service: Gastroenterology;  Laterality: N/A;  645  ASA 3   ESOPHAGOGASTRODUODENOSCOPY (EGD) WITH PROPOFOL N/A 01/04/2022   Surgeon: Montez Morita, Quillian Quince, MD;  clotted blood at GE junction, esophageal ulcer with stigmata of recent bleeding, 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 Pathology with reactive gastropathy, negative for H. pylori.  Recommended repeating EGD in 3 months   ORIF CLAVICULAR FRACTURE Left     Family History  Problem Relation Age of Onset   Stroke Mother    Transient ischemic attack Mother    Stroke Father    Heart attack Father    Mental illness Brother    Breast cancer Daughter 65       [name]   Social History:  reports that she has  never smoked. She has never been exposed to tobacco smoke. She has never used smokeless tobacco. She reports current alcohol use. She reports that she does not use drugs.  Allergies: No Known Allergies  Medications Prior to Admission  Medication Sig Dispense Refill   acidophilus (RISAQUAD) CAPS capsule Take by mouth daily.     amLODipine-olmesartan (AZOR) 5-20 MG tablet Take 1 tablet by mouth daily.     aspirin EC 81 MG tablet Take 81 mg by mouth daily. Swallow whole.     ezetimibe (ZETIA) 10 MG tablet Take 10 mg by mouth daily.     ferrous sulfate 325 (65 FE) MG EC tablet Take 325 mg by mouth daily with breakfast.     hydroxychloroquine (PLAQUENIL) 200 MG tablet Take 200 mg by mouth daily.     leflunomide (ARAVA) 10 MG tablet Take 10 mg by mouth daily.     pantoprazole (PROTONIX) 40 MG tablet Take 40 mg by mouth daily.     sertraline (ZOLOFT) 25 MG tablet Take 25 mg by mouth daily.     solifenacin (VESICARE) 5 MG tablet Take 5 mg by mouth daily.     Vibegron (GEMTESA) 75 MG TABS Take 75 mg by mouth daily at 6 (six) AM.     acetaminophen (TYLENOL) 650 MG CR tablet Take 650 mg by mouth every 8 (eight) hours as needed for pain (tylenol arthritis TID).     cholecalciferol (VITAMIN D3) 25 MCG (1000 UNIT) tablet Take 1,000 Units by  mouth daily.     Melatonin 10 MG TABS Take 10 mg by mouth at bedtime.     multivitamin-iron-minerals-folic acid (CENTRUM) chewable tablet Chew 1 tablet by mouth daily.      No results found for this or any previous visit (from the past 48 hour(s)). No results found.  Review of Systems  All other systems reviewed and are negative.   Blood pressure (!) 167/82, pulse 77, temperature 98.2 F (36.8 C), temperature source Oral, resp. rate 13, height 5' 4"$  (1.626 m), weight 48.9 kg, SpO2 95 %. Physical Exam  GENERAL: The patient is AO x3, in no acute distress. HEENT: Head is normocephalic and atraumatic. EOMI are intact. Mouth is well hydrated and without  lesions. NECK: Supple. No masses LUNGS: Clear to auscultation. No presence of rhonchi/wheezing/rales. Adequate chest expansion HEART: RRR, normal s1 and s2. ABDOMEN: Soft, nontender, no guarding, no peritoneal signs, and nondistended. BS +. No masses. EXTREMITIES: Without any cyanosis, clubbing, rash, lesions or edema. NEUROLOGIC: AOx3, no focal motor deficit. SKIN: no jaundice, no rashes  Assessment/Plan 82 year old female with history of anemia, arthritis, previous stroke chronically on Plavix, coming for follow-up of gastric ulcers.  Will proceed with EGD.  Harvel Quale, MD 06/05/2022, 11:54 AM

## 2022-06-05 NOTE — Discharge Instructions (Signed)
You are being discharged to home.  Resume your previous diet.  We are waiting for your pathology results.  Increase pantoprazole to 40 mg BID. Your physician has recommended a repeat upper endoscopy in six months for surveillance.

## 2022-06-05 NOTE — Anesthesia Preprocedure Evaluation (Signed)
Anesthesia Evaluation  Patient identified by MRN, date of birth, ID band Patient awake    Reviewed: Allergy & Precautions, H&P , NPO status , Patient's Chart, lab work & pertinent test results, reviewed documented beta blocker date and time   Airway Mallampati: II  TM Distance: >3 FB Neck ROM: full    Dental no notable dental hx.    Pulmonary neg pulmonary ROS   Pulmonary exam normal breath sounds clear to auscultation       Cardiovascular Exercise Tolerance: Good hypertension, negative cardio ROS  Rhythm:regular Rate:Normal     Neuro/Psych  Neuromuscular disease CVA negative neurological ROS  negative psych ROS   GI/Hepatic negative GI ROS, Neg liver ROS, hiatal hernia,,,  Endo/Other  negative endocrine ROS    Renal/GU Renal diseasenegative Renal ROS  negative genitourinary   Musculoskeletal   Abdominal   Peds  Hematology negative hematology ROS (+) Blood dyscrasia, anemia   Anesthesia Other Findings   Reproductive/Obstetrics negative OB ROS                             Anesthesia Physical Anesthesia Plan  ASA: 2  Anesthesia Plan: General   Post-op Pain Management:    Induction:   PONV Risk Score and Plan: Propofol infusion  Airway Management Planned:   Additional Equipment:   Intra-op Plan:   Post-operative Plan:   Informed Consent: I have reviewed the patients History and Physical, chart, labs and discussed the procedure including the risks, benefits and alternatives for the proposed anesthesia with the patient or authorized representative who has indicated his/her understanding and acceptance.     Dental Advisory Given  Plan Discussed with: CRNA  Anesthesia Plan Comments:        Anesthesia Quick Evaluation

## 2022-06-05 NOTE — Op Note (Signed)
[facility] Patient Name: Sarah Page Procedure Date: 06/05/2022 12:40 PM MRN: [id number] Date of Birth: 06-16-1940 Attending MD: Maylon Peppers , , [id number] CSN: [id number] Age: 82 Admit Type: Outpatient Procedure:                Upper GI endoscopy Indications:              Follow-up of acute gastric ulcer Providers:                Maylon Peppers, Rosina Lowenstein, RN, Raphael Gibney,                            Technician Referring MD:              Medicines:                Monitored Anesthesia Care Complications:            No immediate complications. Estimated Blood Loss:     Estimated blood loss: none. Procedure:                Pre-Anesthesia Assessment:                           - Prior to the procedure, a History and Physical                            was performed, and patient medications, allergies                            and sensitivities were reviewed. The patient's                            tolerance of previous anesthesia was reviewed.                           - The risks and benefits of the procedure and the                            sedation options and risks were discussed with the                            patient. All questions were answered and informed                            consent was obtained.                           - ASA Grade Assessment: II - A patient with mild                            systemic disease.                           After obtaining informed consent, the endoscope was                            passed under direct vision. Throughout the  procedure, the patient's blood pressure, pulse, and                            oxygen saturations were monitored continuously. The                            [id number]) scope was introduced through the                            mouth, and advanced to the second part of duodenum.                            The patient tolerated the procedure well. The upper                             GI endoscopy was accomplished without difficulty. 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      One non-bleeding cratered gastric ulcer with a clean ulcer base (Forrest       Class III) was found in the gastric antrum. The lesion was 8 mm in       largest dimension. Biopsies were taken with a cold forceps for       Helicobacter pylori testing.      The examined duodenum was normal.      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                           - Non-bleeding gastric ulcer with a clean ulcer                            base (Forrest Class III). Biopsied.                           - Normal examined duodenum. Moderate Sedation:      Per Anesthesia Care Recommendation:           - Discharge patient to home (ambulatory).                           - Resume previous diet.                           - Increase pantoprazole to 40 mg BID.                           - Await pathology results.                           - Repeat upper endoscopy in 6 months for  surveillance. Procedure Code(s):        --- Professional ---                           [phone], Esophagogastroduodenoscopy, flexible,                            transoral; with biopsy, single or multiple Diagnosis Code(s):        --- Professional ---                           K44.9, Diaphragmatic hernia without obstruction or                            gangrene                           K25.9, Gastric ulcer, unspecified as acute or                            chronic, without hemorrhage or perforation                           K25.3, Acute gastric ulcer without hemorrhage or                            perforation CPT copyright 2022  American Medical Association. All rights reserved. The codes documented in this report are preliminary and upon coder review may  be revised to meet current compliance requirements. Maylon Peppers, MD Maylon Peppers,  06/05/2022 1:09:52 PM This report has been signed electronically. Number of Addenda: 0

## 2022-06-06 LAB — SURGICAL PATHOLOGY

## 2022-06-07 ENCOUNTER — Telehealth (INDEPENDENT_AMBULATORY_CARE_PROVIDER_SITE_OTHER): Payer: Self-pay | Admitting: Gastroenterology

## 2022-06-07 ENCOUNTER — Other Ambulatory Visit (INDEPENDENT_AMBULATORY_CARE_PROVIDER_SITE_OTHER): Payer: Self-pay

## 2022-06-07 ENCOUNTER — Ambulatory Visit (INDEPENDENT_AMBULATORY_CARE_PROVIDER_SITE_OTHER): Payer: Self-pay

## 2022-06-07 ENCOUNTER — Other Ambulatory Visit: Payer: Self-pay

## 2022-06-07 DIAGNOSIS — D509 Iron deficiency anemia, unspecified: Secondary | ICD-10-CM

## 2022-06-07 NOTE — Telephone Encounter (Signed)
Can we pleas set her up for IV iron infusion x2 at [facility]? Dx: recurrent IDA  Thanks

## 2022-06-07 NOTE — Progress Notes (Signed)
A user error has taken place: encounter opened in error, closed for administrative reasons.

## 2022-06-07 NOTE — Telephone Encounter (Signed)
Venofer please, 2 sessions, 1 week separate from each other Thanks

## 2022-06-07 NOTE — Telephone Encounter (Signed)
Per Conversation w Jena Gauss:  We don't do Infed - we do venofer, feraheme, and monoferric - most insurance likes venofer   SM and we have those in the therapy plans :)  Now ok, thanks I will let him know  Now SM Terence Lux, RN I checked with our prior auth specialist and for [facility] its Venofer unless she has not tolerated it

## 2022-06-07 NOTE — Telephone Encounter (Signed)
I found the Infusion navigator,but I did not know the doseage as there was a 200 and a 500 mg and did not know how to change the order for it to be done Venofer ___ mg by IV x two one week apart. I have left the order open and unsigned, so hopefully you can look at it an sign off if needed. Thanks

## 2022-06-07 NOTE — Telephone Encounter (Signed)
I can not find the therapy plans do you mind placing them?

## 2022-06-07 NOTE — Telephone Encounter (Signed)
Which Iron? Ferraheme? Venofer? Infed?

## 2022-06-07 NOTE — Telephone Encounter (Signed)
I modified the order, she should actually get 5 doses, each dose one week apart from each other. Order is in, please check with the infusion center if there is anything else we need to do from our side. Thanks

## 2022-06-08 NOTE — Transfer of Care (Signed)
Immediate Anesthesia Transfer of Care Note  Patient: Sarah Page  Procedure(s) Performed: ESOPHAGOGASTRODUODENOSCOPY (EGD) WITH PROPOFOL BIOPSY  Patient Location: PACU  Anesthesia Type:General  Level of Consciousness: awake and alert   Airway & Oxygen Therapy: Patient Spontanous Breathing  Post-op Assessment: Report given to RN and Post -op Vital signs reviewed and stable  Post vital signs: Reviewed and stable  Last Vitals:  Vitals Value Taken Time  BP 115/83 06/05/22 1313  Temp 36.5 C 06/05/22 1313  Pulse 75 06/05/22 1313  Resp 17 06/05/22 1313  SpO2 94 % 06/05/22 1313    Last Pain:  Vitals:   06/05/22 1313  TempSrc: Axillary  PainSc: 0-No pain         Complications: No notable events documented.

## 2022-06-08 NOTE — Anesthesia Postprocedure Evaluation (Signed)
Anesthesia Post Note  Patient: Sarah Page  Procedure(s) Performed: ESOPHAGOGASTRODUODENOSCOPY (EGD) WITH PROPOFOL BIOPSY  Patient location during evaluation: Phase II Anesthesia Type: General Level of consciousness: awake Pain management: pain level controlled Vital Signs Assessment: post-procedure vital signs reviewed and stable Respiratory status: spontaneous breathing and respiratory function stable Cardiovascular status: blood pressure returned to baseline and stable Postop Assessment: no headache and no apparent nausea or vomiting Anesthetic complications: no Comments: Late entry   No notable events documented.   Last Vitals:  Vitals:   06/05/22 1135 06/05/22 1313  BP: (!) 167/82 115/83  Pulse: 77 75  Resp: 13 17  Temp: 36.8 C 36.5 C  SpO2: 95% 94%    Last Pain:  Vitals:   06/05/22 1313  TempSrc: Axillary  PainSc: 0-No pain                 Louann Sjogren

## 2022-06-10 ENCOUNTER — Telehealth: Payer: Self-pay | Admitting: Pharmacy Technician

## 2022-06-10 NOTE — Telephone Encounter (Signed)
Dr. Ferrel Logan  Fyi note:  Auth Submission: NO AUTH NEEDED @AP$  Payer: [employer] Rossetti MEDICARE Medication & CPT/J Code(s) submitted: Venofer (Iron Sucrose) J1756 Route of submission (phone, fax, portal):  Phone # Fax # Auth type: Buy/Bill Units/visits requested: 5 Reference number:  Approval from: 05/1822 to 11/08/22   Patient will be scheduled as soon as possible

## 2022-06-11 ENCOUNTER — Encounter (HOSPITAL_COMMUNITY): Payer: Self-pay | Admitting: Gastroenterology

## 2022-06-11 NOTE — Telephone Encounter (Signed)
I spoke with the patient, she is aware she will be getting a call from [facility] to set up the infusion.    Dr. Ferrel Logan   Fyi note:   Auth Submission: NO AUTH NEEDED @AP$  Payer: [employer] Rossetti MEDICARE Medication & CPT/J Code(s) submitted: Venofer (Iron Sucrose) J1756 Route of submission (phone, fax, portal):  Phone # Fax # Auth type: Buy/Bill Units/visits requested: 5 Reference number:  Approval from: 05/1822 to 11/08/22    Patient will be scheduled as soon as possible      Note

## 2022-06-12 ENCOUNTER — Encounter (HOSPITAL_COMMUNITY)
Admission: RE | Admit: 2022-06-12 | Discharge: 2022-06-12 | Disposition: A | Discharge status: Home / Self Care | Payer: Medicare PPO | Source: Ambulatory Visit | Attending: Gastroenterology | Admitting: Gastroenterology

## 2022-06-12 VITALS — BP 151/72 | HR 72 | Temp 98.2°F | Resp 16

## 2022-06-12 DIAGNOSIS — D509 Iron deficiency anemia, unspecified: Secondary | ICD-10-CM | POA: Insufficient documentation

## 2022-06-12 MED ORDER — SODIUM CHLORIDE 0.9 % IV SOLN
200.0000 mg | Freq: Once | INTRAVENOUS | Status: AC
  Administered 2022-06-12: 200 mg via INTRAVENOUS
  Filled 2022-06-12: qty 200

## 2022-06-12 MED ORDER — DIPHENHYDRAMINE HCL 25 MG PO CAPS
25.0000 mg | ORAL_CAPSULE | Freq: Once | ORAL | Status: AC
  Administered 2022-06-12: 25 mg via ORAL

## 2022-06-12 MED ORDER — ACETAMINOPHEN 325 MG PO TABS
650.0000 mg | ORAL_TABLET | Freq: Once | ORAL | Status: AC
  Administered 2022-06-12: 650 mg via ORAL

## 2022-06-12 NOTE — Progress Notes (Addendum)
Diagnosis: Iron Deficiency Anemia  Provider:  Harvel Quale MD  Procedure: Infusion  IV Type: Peripheral, IV Location: L Antecubital  Venofer (Iron Sucrose), Dose: 200 mg  Infusion Start Time: [id number]  Infusion Stop Time: 1219  Post Infusion IV Care: Observation period completed and Peripheral IV Discontinued  Discharge: Condition: Stable, Destination: Home . AVS Provided  Performed by:  Binnie Kand, RN

## 2022-06-12 NOTE — Telephone Encounter (Signed)
Patient is scheduled  With [facility] 06/14/2022 at 12:30 PM

## 2022-06-12 NOTE — Telephone Encounter (Signed)
I called and left a detailed message regarding the date and time of the infusion at [facility]. I asked that she please let me know she was made aware of this.

## 2022-06-12 NOTE — Addendum Note (Signed)
Encounter addended by: Baxter Hire, RN on: 06/12/2022 3:33 PM  Actions taken: Therapy plan modified

## 2022-06-14 ENCOUNTER — Encounter (HOSPITAL_COMMUNITY)
Admission: RE | Admit: 2022-06-14 | Discharge: 2022-06-14 | Disposition: A | Discharge status: Home / Self Care | Payer: Medicare PPO | Source: Ambulatory Visit | Attending: Gastroenterology | Admitting: Gastroenterology

## 2022-06-14 VITALS — BP 138/74 | HR 83 | Temp 98.3°F | Resp 20

## 2022-06-14 DIAGNOSIS — D509 Iron deficiency anemia, unspecified: Secondary | ICD-10-CM | POA: Diagnosis not present

## 2022-06-14 MED ORDER — ACETAMINOPHEN 325 MG PO TABS: 650.0000 mg | ORAL_TABLET | Freq: Once | ORAL | Status: DC

## 2022-06-14 MED ORDER — SODIUM CHLORIDE 0.9 % IV SOLN
200.0000 mg | Freq: Once | INTRAVENOUS | Status: DC
  Filled 2022-06-14: qty 10

## 2022-06-14 MED ORDER — SODIUM CHLORIDE 0.9 % IV SOLN
200.0000 mg | Freq: Once | INTRAVENOUS | Status: AC
  Administered 2022-06-14: 200 mg via INTRAVENOUS
  Filled 2022-06-14: qty 10

## 2022-06-14 MED ORDER — DIPHENHYDRAMINE HCL 25 MG PO CAPS: 25.0000 mg | ORAL_CAPSULE | Freq: Once | ORAL | Status: DC

## 2022-06-14 NOTE — Progress Notes (Addendum)
Diagnosis: Iron Deficiency Anemia  Provider:  Harvel Quale MD  Procedure: Infusion  IV Type: Peripheral, IV Location: L Antecubital  Venofer (Iron Sucrose), Dose: 200 mg  Infusion Start Time: 1227  Infusion Stop Time: [id number]  Post Infusion IV Care: Observation period completed  Discharge: Condition: Good, Destination: Home . AVS Provided  Performed by:  Kennith Maes, RN

## 2022-06-19 ENCOUNTER — Encounter (HOSPITAL_COMMUNITY)
Admission: RE | Admit: 2022-06-19 | Discharge: 2022-06-19 | Disposition: A | Discharge status: Home / Self Care | Payer: Medicare PPO | Source: Ambulatory Visit | Attending: Gastroenterology | Admitting: Gastroenterology

## 2022-06-19 VITALS — BP 137/76 | HR 85 | Temp 98.1°F | Resp 16

## 2022-06-19 DIAGNOSIS — D509 Iron deficiency anemia, unspecified: Secondary | ICD-10-CM | POA: Diagnosis not present

## 2022-06-19 MED ORDER — ACETAMINOPHEN 325 MG PO TABS: 650.0000 mg | ORAL_TABLET | Freq: Once | ORAL | Status: DC

## 2022-06-19 MED ORDER — DIPHENHYDRAMINE HCL 25 MG PO CAPS: 25.0000 mg | ORAL_CAPSULE | Freq: Once | ORAL | Status: DC

## 2022-06-19 MED ORDER — SODIUM CHLORIDE 0.9 % IV SOLN
200.0000 mg | Freq: Once | INTRAVENOUS | Status: AC
  Administered 2022-06-19: 200 mg via INTRAVENOUS
  Filled 2022-06-19: qty 200

## 2022-06-19 NOTE — Progress Notes (Signed)
Diagnosis: Iron Deficiency Anemia  Provider:  Harvel Quale MD  Procedure: Infusion  IV Type: Peripheral, IV Location: R Antecubital  Venofer (Iron Sucrose), Dose: 200 mg  Infusion Start Time: [id number]  Infusion Stop Time: [id number]  Post Infusion IV Care: Patient declined observation and Peripheral IV Discontinued  Discharge: Condition: Stable, Destination: Home . AVS Provided  Performed by:  Binnie Kand, RN

## 2022-06-19 NOTE — Addendum Note (Signed)
Encounter addended by: Baxter Hire, RN on: 06/19/2022 1:01 PM  Actions taken: Therapy plan modified

## 2022-06-20 ENCOUNTER — Encounter (HOSPITAL_COMMUNITY): Payer: Medicare PPO

## 2022-06-21 ENCOUNTER — Telehealth (INDEPENDENT_AMBULATORY_CARE_PROVIDER_SITE_OTHER): Payer: Self-pay | Admitting: *Deleted

## 2022-06-21 ENCOUNTER — Encounter (HOSPITAL_COMMUNITY): Admission: RE | Admit: 2022-06-21 | Payer: Medicare PPO | Source: Ambulatory Visit

## 2022-06-22 NOTE — Telephone Encounter (Signed)
error 

## 2022-06-26 ENCOUNTER — Encounter (HOSPITAL_COMMUNITY): Payer: Medicare PPO

## 2022-06-28 ENCOUNTER — Encounter (HOSPITAL_COMMUNITY)
Admission: RE | Admit: 2022-06-28 | Discharge: 2022-06-28 | Disposition: A | Discharge status: Home / Self Care | Payer: Medicare PPO | Source: Ambulatory Visit | Attending: Gastroenterology | Admitting: Gastroenterology

## 2022-06-28 VITALS — BP 129/65 | HR 77 | Temp 98.2°F | Resp 16

## 2022-06-28 DIAGNOSIS — D509 Iron deficiency anemia, unspecified: Secondary | ICD-10-CM | POA: Insufficient documentation

## 2022-06-28 MED ORDER — SODIUM CHLORIDE 0.9 % IV SOLN
200.0000 mg | Freq: Once | INTRAVENOUS | Status: AC
  Administered 2022-06-28: 200 mg via INTRAVENOUS
  Filled 2022-06-28: qty 10

## 2022-06-28 NOTE — Addendum Note (Signed)
Encounter addended by: Baxter Hire, RN on: 06/28/2022 3:10 PM  Actions taken: Therapy plan modified

## 2022-06-28 NOTE — Progress Notes (Signed)
Diagnosis: Iron Deficiency Anemia  Provider:  Maylon Peppers MD  Procedure: Infusion  IV Type: Peripheral, IV Location: L Antecubital  Venofer (Iron Sucrose), Dose: 200 mg  Infusion Start Time: 1223  Infusion Stop Time: 1240  Post Infusion IV Care: Patient declined observation and Peripheral IV Discontinued  Discharge: Condition: Good, Destination: Home . AVS Provided  Performed by:  Baxter Hire, RN

## 2022-07-03 ENCOUNTER — Encounter (HOSPITAL_COMMUNITY)
Admission: RE | Admit: 2022-07-03 | Discharge: 2022-07-03 | Disposition: A | Discharge status: Home / Self Care | Payer: Medicare PPO | Source: Ambulatory Visit | Attending: Gastroenterology | Admitting: Gastroenterology

## 2022-07-03 VITALS — BP 153/82 | HR 94 | Resp 20

## 2022-07-03 DIAGNOSIS — D509 Iron deficiency anemia, unspecified: Secondary | ICD-10-CM

## 2022-07-03 MED ORDER — SODIUM CHLORIDE 0.9 % IV SOLN
200.0000 mg | Freq: Once | INTRAVENOUS | Status: AC
  Administered 2022-07-03: 200 mg via INTRAVENOUS
  Filled 2022-07-03: qty 200

## 2022-07-03 MED ORDER — ACETAMINOPHEN 325 MG PO TABS: 650.0000 mg | ORAL_TABLET | Freq: Once | ORAL | Status: DC

## 2022-07-03 MED ORDER — DIPHENHYDRAMINE HCL 25 MG PO CAPS: 25.0000 mg | ORAL_CAPSULE | Freq: Once | ORAL | Status: DC

## 2022-07-03 NOTE — Addendum Note (Signed)
Encounter addended by: Baxter Hire, RN on: 07/03/2022 1:52 PM  Actions taken: Therapy plan modified

## 2022-07-04 ENCOUNTER — Other Ambulatory Visit: Payer: Self-pay

## 2022-07-04 ENCOUNTER — Encounter (HOSPITAL_COMMUNITY): Payer: Self-pay | Admitting: Family Medicine

## 2022-07-04 ENCOUNTER — Observation Stay (HOSPITAL_COMMUNITY)
Admission: EM | Admit: 2022-07-04 | Discharge: 2022-07-05 | Disposition: A | Discharge status: Home / Self Care | Payer: Medicare PPO | Attending: Family Medicine | Admitting: Family Medicine

## 2022-07-04 DIAGNOSIS — M069 Rheumatoid arthritis, unspecified: Secondary | ICD-10-CM | POA: Insufficient documentation

## 2022-07-04 DIAGNOSIS — Z8719 Personal history of other diseases of the digestive system: Secondary | ICD-10-CM

## 2022-07-04 DIAGNOSIS — E559 Vitamin D deficiency, unspecified: Secondary | ICD-10-CM | POA: Diagnosis not present

## 2022-07-04 DIAGNOSIS — Z79899 Other long term (current) drug therapy: Secondary | ICD-10-CM | POA: Insufficient documentation

## 2022-07-04 DIAGNOSIS — D509 Iron deficiency anemia, unspecified: Secondary | ICD-10-CM | POA: Diagnosis not present

## 2022-07-04 DIAGNOSIS — K625 Hemorrhage of anus and rectum: Secondary | ICD-10-CM | POA: Diagnosis not present

## 2022-07-04 DIAGNOSIS — R531 Weakness: Secondary | ICD-10-CM

## 2022-07-04 DIAGNOSIS — K259 Gastric ulcer, unspecified as acute or chronic, without hemorrhage or perforation: Secondary | ICD-10-CM | POA: Diagnosis present

## 2022-07-04 DIAGNOSIS — K922 Gastrointestinal hemorrhage, unspecified: Secondary | ICD-10-CM

## 2022-07-04 DIAGNOSIS — N319 Neuromuscular dysfunction of bladder, unspecified: Secondary | ICD-10-CM | POA: Insufficient documentation

## 2022-07-04 DIAGNOSIS — Z7982 Long term (current) use of aspirin: Secondary | ICD-10-CM | POA: Insufficient documentation

## 2022-07-04 DIAGNOSIS — Z8673 Personal history of transient ischemic attack (TIA), and cerebral infarction without residual deficits: Secondary | ICD-10-CM | POA: Diagnosis not present

## 2022-07-04 DIAGNOSIS — I10 Essential (primary) hypertension: Secondary | ICD-10-CM | POA: Diagnosis not present

## 2022-07-04 DIAGNOSIS — R9431 Abnormal electrocardiogram [ECG] [EKG]: Secondary | ICD-10-CM | POA: Diagnosis not present

## 2022-07-04 DIAGNOSIS — D62 Acute posthemorrhagic anemia: Secondary | ICD-10-CM | POA: Diagnosis not present

## 2022-07-04 DIAGNOSIS — K5790 Diverticulosis of intestine, part unspecified, without perforation or abscess without bleeding: Secondary | ICD-10-CM | POA: Diagnosis not present

## 2022-07-04 DIAGNOSIS — Z8711 Personal history of peptic ulcer disease: Secondary | ICD-10-CM

## 2022-07-04 DIAGNOSIS — R195 Other fecal abnormalities: Secondary | ICD-10-CM | POA: Diagnosis not present

## 2022-07-04 DIAGNOSIS — E7800 Pure hypercholesterolemia, unspecified: Secondary | ICD-10-CM | POA: Insufficient documentation

## 2022-07-04 DIAGNOSIS — G4700 Insomnia, unspecified: Secondary | ICD-10-CM | POA: Diagnosis present

## 2022-07-04 LAB — COMPREHENSIVE METABOLIC PANEL
ALT: 33 U/L (ref 0–44)
AST: 45 U/L — ABNORMAL HIGH (ref 15–41)
Albumin: 3.5 g/dL (ref 3.5–5.0)
Alkaline Phosphatase: 66 U/L (ref 38–126)
Anion gap: 9 (ref 5–15)
BUN: 21 mg/dL (ref 8–23)
CO2: 28 mmol/L (ref 22–32)
Calcium: 8.6 mg/dL — ABNORMAL LOW (ref 8.9–10.3)
Chloride: 104 mmol/L (ref 98–111)
Creatinine, Ser: 0.94 mg/dL (ref 0.44–1.00)
GFR, Estimated: >60 mL/min (ref >60)
Glucose, Bld: 138 mg/dL — ABNORMAL HIGH (ref 70–99)
Potassium: 4.1 mmol/L (ref 3.5–5.1)
Sodium: 141 mmol/L (ref 135–145)
Total Bilirubin: 0.4 mg/dL (ref 0.3–1.2)
Total Protein: 6.4 g/dL — ABNORMAL LOW (ref 6.5–8.1)

## 2022-07-04 LAB — CBC
HCT: 28 % — ABNORMAL LOW (ref 36.0–46.0)
Hemoglobin: 8.9 g/dL — ABNORMAL LOW (ref 12.0–15.0)
MCH: 30.7 pg (ref 26.0–34.0)
MCHC: 31.8 g/dL (ref 30.0–36.0)
MCV: 96.6 fL (ref 80.0–100.0)
Platelets: 182 10*3/uL (ref 150–400)
RBC: 2.9 MIL/uL — ABNORMAL LOW (ref 3.87–5.11)
RDW: 13.1 % (ref 11.5–15.5)
WBC: 4.1 10*3/uL (ref 4.0–10.5)
nRBC: 0 % (ref 0.0–0.2)

## 2022-07-04 LAB — CBC WITH DIFFERENTIAL/PLATELET
Abs Immature Granulocytes: 0.02 10*3/uL (ref 0.00–0.07)
Basophils Absolute: 0.1 10*3/uL (ref 0.0–0.1)
Basophils Relative: 2 %
Eosinophils Absolute: 0.2 10*3/uL (ref 0.0–0.5)
Eosinophils Relative: 3 %
HCT: 30.9 % — ABNORMAL LOW (ref 36.0–46.0)
Hemoglobin: 9.8 g/dL — ABNORMAL LOW (ref 12.0–15.0)
Immature Granulocytes: 0 %
Lymphocytes Relative: 9 %
Lymphs Abs: 0.6 10*3/uL — ABNORMAL LOW (ref 0.7–4.0)
MCH: 30.8 pg (ref 26.0–34.0)
MCHC: 31.7 g/dL (ref 30.0–36.0)
MCV: 97.2 fL (ref 80.0–100.0)
Monocytes Absolute: 0.6 10*3/uL (ref 0.1–1.0)
Monocytes Relative: 10 %
Neutro Abs: 5.1 10*3/uL (ref 1.7–7.7)
Neutrophils Relative %: 76 %
Platelets: 194 10*3/uL (ref 150–400)
RBC: 3.18 MIL/uL — ABNORMAL LOW (ref 3.87–5.11)
RDW: 13.2 % (ref 11.5–15.5)
WBC: 6.6 10*3/uL (ref 4.0–10.5)
nRBC: 0 % (ref 0.0–0.2)

## 2022-07-04 LAB — PROTIME-INR
INR: 1 (ref 0.8–1.2)
Prothrombin Time: 12.9 seconds (ref 11.4–15.2)

## 2022-07-04 LAB — LACTIC ACID, PLASMA: Lactic Acid, Venous: 1.7 mmol/L (ref 0.5–1.9)

## 2022-07-04 LAB — TYPE AND SCREEN
ABO/RH(D): O POS
Antibody Screen: NEGATIVE

## 2022-07-04 MED ORDER — ADULT MULTIVITAMIN W/MINERALS CH
1.0000 | ORAL_TABLET | Freq: Every day | ORAL | Status: DC
  Administered 2022-07-04 – 2022-07-05 (×2): 1 via ORAL
  Filled 2022-07-04 (×2): qty 1

## 2022-07-04 MED ORDER — LEFLUNOMIDE 10 MG PO TABS
10.0000 mg | ORAL_TABLET | Freq: Every day | ORAL | Status: DC
  Administered 2022-07-04 – 2022-07-05 (×2): 10 mg via ORAL
  Filled 2022-07-04 (×3): qty 1

## 2022-07-04 MED ORDER — HYDROXYCHLOROQUINE SULFATE 200 MG PO TABS
200.0000 mg | ORAL_TABLET | Freq: Every day | ORAL | Status: DC
  Administered 2022-07-04 – 2022-07-05 (×2): 200 mg via ORAL
  Filled 2022-07-04 (×2): qty 1

## 2022-07-04 MED ORDER — ONDANSETRON HCL 4 MG PO TABS: 4.0000 mg | ORAL_TABLET | Freq: Four times a day (QID) | ORAL | Status: DC | PRN

## 2022-07-04 MED ORDER — ONDANSETRON HCL 4 MG/2ML IJ SOLN: 4.0000 mg | Freq: Four times a day (QID) | INTRAMUSCULAR | Status: DC | PRN

## 2022-07-04 MED ORDER — EZETIMIBE 10 MG PO TABS
10.0000 mg | ORAL_TABLET | Freq: Every day | ORAL | Status: DC
  Administered 2022-07-04 – 2022-07-05 (×2): 10 mg via ORAL
  Filled 2022-07-04 (×2): qty 1

## 2022-07-04 MED ORDER — SODIUM CHLORIDE 0.9 % IV SOLN: INTRAVENOUS | Status: AC

## 2022-07-04 MED ORDER — MELATONIN 3 MG PO TABS
9.0000 mg | ORAL_TABLET | Freq: Every day | ORAL | Status: DC
  Administered 2022-07-04: 9 mg via ORAL
  Filled 2022-07-04: qty 3

## 2022-07-04 MED ORDER — FESOTERODINE FUMARATE ER 4 MG PO TB24
4.0000 mg | ORAL_TABLET | Freq: Every day | ORAL | Status: DC
  Administered 2022-07-04 – 2022-07-05 (×2): 4 mg via ORAL
  Filled 2022-07-04 (×2): qty 1

## 2022-07-04 MED ORDER — VITAMIN D 25 MCG (1000 UNIT) PO TABS
1000.0000 [IU] | ORAL_TABLET | Freq: Every day | ORAL | Status: DC
  Administered 2022-07-04 – 2022-07-05 (×2): 1000 [IU] via ORAL
  Filled 2022-07-04 (×2): qty 1

## 2022-07-04 MED ORDER — IRBESARTAN 150 MG PO TABS
150.0000 mg | ORAL_TABLET | Freq: Every day | ORAL | Status: DC
  Administered 2022-07-04 – 2022-07-05 (×2): 150 mg via ORAL
  Filled 2022-07-04 (×2): qty 1

## 2022-07-04 MED ORDER — AMLODIPINE-OLMESARTAN 5-20 MG PO TABS: 1.0000 | ORAL_TABLET | Freq: Every day | ORAL | Status: DC

## 2022-07-04 MED ORDER — FERROUS SULFATE 325 (65 FE) MG PO TABS
325.0000 mg | ORAL_TABLET | Freq: Every day | ORAL | Status: DC
  Administered 2022-07-05: 325 mg via ORAL
  Filled 2022-07-04 (×2): qty 1

## 2022-07-04 MED ORDER — PANTOPRAZOLE SODIUM 40 MG PO TBEC
40.0000 mg | DELAYED_RELEASE_TABLET | Freq: Two times a day (BID) | ORAL | Status: DC
  Administered 2022-07-04 – 2022-07-05 (×3): 40 mg via ORAL
  Filled 2022-07-04 (×3): qty 1

## 2022-07-04 MED ORDER — MIRABEGRON ER 25 MG PO TB24
25.0000 mg | ORAL_TABLET | Freq: Every day | ORAL | Status: DC
  Administered 2022-07-04 – 2022-07-05 (×2): 25 mg via ORAL
  Filled 2022-07-04 (×2): qty 1

## 2022-07-04 MED ORDER — ACETAMINOPHEN 325 MG PO TABS: 650.0000 mg | ORAL_TABLET | Freq: Four times a day (QID) | ORAL | Status: DC | PRN

## 2022-07-04 MED ORDER — MELATONIN 5 MG PO TABS
10.0000 mg | ORAL_TABLET | Freq: Every day | ORAL | Status: DC
  Filled 2022-07-04 (×2): qty 2

## 2022-07-04 MED ORDER — AMLODIPINE BESYLATE 5 MG PO TABS
5.0000 mg | ORAL_TABLET | Freq: Every day | ORAL | Status: DC
  Administered 2022-07-04 – 2022-07-05 (×2): 5 mg via ORAL
  Filled 2022-07-04 (×2): qty 1

## 2022-07-04 MED ORDER — SERTRALINE HCL 50 MG PO TABS
25.0000 mg | ORAL_TABLET | Freq: Every day | ORAL | Status: DC
  Administered 2022-07-04 – 2022-07-05 (×2): 25 mg via ORAL
  Filled 2022-07-04 (×2): qty 1

## 2022-07-04 MED ORDER — OXYCODONE HCL 5 MG PO TABS: 5.0000 mg | ORAL_TABLET | Freq: Four times a day (QID) | ORAL | Status: DC | PRN

## 2022-07-04 MED ORDER — ACETAMINOPHEN 650 MG RE SUPP: 650.0000 mg | Freq: Four times a day (QID) | RECTAL | Status: DC | PRN

## 2022-07-04 NOTE — H&P (Addendum)
History and Physical    Cheyana Battles [id number] DOB: 1941-01-18 DOA: 07/04/2022  PCP: Curlene Labrum, MD  Patient coming from: Home  Chief Complaint: Rectal bleeding  HPI: Sarah Page is a 82 y.o. female with medical history significant for diverticulosis, peptic ulcer, iron deficiency anemia, past stroke now on ASA, and rheumatoid arthritis presenting with bright red rectal bleeding this morning.  She had a bowel movement in the AM and it was bright red, minimal stool passed.  She feels overall well, though somewhat weak/tired.  She denies shortness of breath, chest pain, abdominal pain, nausea/vomiting/diarrhea, or recent illness.  She does not take NSAIDs and does not drink alcohol.  She follows outpatient with Dr. Jenetta Downer for iron infusions for iron deficiency anemia and her last one was yesterday.  She was hospitalized in February for upper GI bleed and EGD showed multiple gastric ulcers.  Did receive transfusion that hospitalization.  September colonoscopy showing extensive diverticulosis  ED Course: Presented following BRBPR, Hgb 9.8 on arrival.  No indication for transfusion at this time.  Hospitalist consulted for admission for blood loss anemia and to trend H&H.  Review of Systems: Reviewed as noted above, otherwise negative.  Past Medical History:  Diagnosis Date   Anemia    Arthritis    Gait disturbance    Rotator cuff tear    DR [facility]   Stroke [facility])    Vision abnormalities    Past Surgical History:  Procedure Laterality Date   BIOPSY  12/26/2021   Procedure: BIOPSY;  Surgeon: Harvel Quale, MD;  [facility];  Service: Gastroenterology;;   BIOPSY  01/04/2022   Procedure: BIOPSY;  Surgeon: Harvel Quale, MD;  [facility];  Service: Gastroenterology;;   BIOPSY  06/05/2022   Procedure: BIOPSY;  Surgeon: Harvel Quale, MD;  [facility];  Service: Gastroenterology;;   COLONOSCOPY  WITH PROPOFOL N/A 12/26/2021   Procedure: COLONOSCOPY WITH PROPOFOL;  Surgeon: Harvel Quale, MD;  [facility];  Service: Gastroenterology;  Laterality: N/A;  645  ASA 3   ESOPHAGOGASTRODUODENOSCOPY (EGD) WITH PROPOFOL N/A 01/04/2022   Surgeon: Montez Morita, Quillian Quince, MD;  clotted blood at GE junction, esophageal ulcer with stigmata of recent bleeding, 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 Pathology with reactive gastropathy, negative for H. pylori.  Recommended repeating EGD in 3 months   ESOPHAGOGASTRODUODENOSCOPY (EGD) WITH PROPOFOL N/A 06/05/2022   Procedure: ESOPHAGOGASTRODUODENOSCOPY (EGD) WITH PROPOFOL;  Surgeon: Harvel Quale, MD;  [facility];  Service: Gastroenterology;  Laterality: N/A;  1045am, asa 3, LM to see if pt can come earlier   ORIF CLAVICULAR FRACTURE Left      reports that she has never smoked. She has never been exposed to tobacco smoke. She has never used smokeless tobacco. She reports current alcohol use. She reports that she does not use drugs.  No Known Allergies  Family History  Problem Relation Age of Onset   Stroke Mother    Transient ischemic attack Mother    Stroke Father    Heart attack Father    Mental illness Brother    Breast cancer Daughter 49       [name]    Prior to Admission medications   Medication Sig Start Date End Date Taking? Authorizing Provider  acetaminophen (TYLENOL) 650 MG CR tablet Take 650 mg  by mouth every 8 (eight) hours as needed for pain (tylenol arthritis TID).    [provider]  acidophilus (RISAQUAD) CAPS capsule Take by mouth daily.    [provider]  amLODipine-olmesartan (AZOR) 5-20 MG tablet Take 1 tablet by mouth daily.    [provider]  aspirin EC 81 MG tablet Take 81 mg by mouth daily. Swallow whole.    [provider]  cholecalciferol (VITAMIN D3) 25 MCG (1000 UNIT) tablet Take 1,000 Units by mouth daily.    [provider]  ezetimibe (ZETIA) 10 MG tablet Take 10 mg by mouth daily.    [provider]  ferrous sulfate 325 (65 FE) MG EC tablet Take 325 mg by mouth daily with breakfast.    [provider]  hydroxychloroquine (PLAQUENIL) 200 MG tablet Take 200 mg by mouth daily. 05/25/21   [provider]  leflunomide (ARAVA) 10 MG tablet Take 10 mg by mouth daily. 05/25/21   [provider]  Melatonin 10 MG TABS Take 10 mg by mouth at bedtime.    [provider]  multivitamin-iron-minerals-folic acid (CENTRUM) chewable tablet Chew 1 tablet by mouth daily.    [provider]  pantoprazole (PROTONIX) 40 MG tablet Take 1 tablet (40 mg total) by mouth 2 (two) times daily. 06/05/22   Harvel Quale, MD  sertraline (ZOLOFT) 25 MG tablet Take 25 mg by mouth daily.    [provider]  solifenacin (VESICARE) 5 MG tablet Take 5 mg by mouth daily.    [provider]  Vibegron (GEMTESA) 75 MG TABS Take 75 mg by mouth daily at 6 (six) AM.    [provider]    Physical Exam: Vitals:   07/04/22 0730 07/04/22 0800 07/04/22 0830 07/04/22 0858  BP: 111/63 114/67 114/66 133/75  Pulse: 79 75 74 99  Resp: '12 13 12 17  '$ Temp:    97.7 F (36.5 C)  TempSrc:    Oral  SpO2: 92% 94% 92% 100%  Weight:   50.9 kg   Height:   '5\' 4"'$  (1.626 m)     Examination: General: Early female resting in bed, no acute distress.  Pleasant appropriate. HEENT: PERRLA.  NCAT.  Mild mucosal pallor. Lungs: CTAB.  Normal work of breathing room air. Cardiovascular: RRR with occasional irregular beat.  Normal S1/S2.  No murmurs/rubs/gallops. Abdomen: Nontender to palpation.  Soft, nondistended. GU: No suprapubic tenderness. Extremities: Warm, perfusing.  Capillary refill less than 2 seconds.  Peripheral pulses 2+ bilaterally. Skin: Warm,  dry.  No rashes or lesions grossly. Neuro: Alert and oriented x 4.  No focal neurological deficit.  Labs on Admission: I have personally reviewed following labs and imaging studies:  CBC: Recent Labs  Lab 07/04/22 0530  WBC 6.6  NEUTROABS 5.1  HGB 9.8*  HCT 30.9*  MCV 97.2  PLT [id number]   Basic Metabolic Panel: Recent Labs  Lab 07/04/22 0530  NA 141  K 4.1  CL 104  CO2 28  GLUCOSE 138*  BUN 21  CREATININE 0.94  CALCIUM 8.6*   GFR: Estimated Creatinine Clearance: 39 mL/min (by C-G formula based on SCr of 0.94 mg/dL). Liver Function Tests: Recent Labs  Lab 07/04/22 0530  AST 45*  ALT 33  ALKPHOS 66  BILITOT 0.4  PROT 6.4*  ALBUMIN 3.5   No results for input(s): "LIPASE", "AMYLASE" in the last 168 hours. No results for input(s): "AMMONIA" in the last 168 hours. Coagulation Profile: Recent Labs  Lab  07/04/22 0530  INR 1.0   Cardiac Enzymes: No results for input(s): "CKTOTAL", "CKMB", "CKMBINDEX", "TROPONINI" in the last 168 hours. BNP (last 3 results) No results for input(s): "PROBNP" in the last 8760 hours. HbA1C: No results for input(s): "HGBA1C" in the last 72 hours. CBG: No results for input(s): "GLUCAP" in the last 168 hours. Lipid Profile: No results for input(s): "CHOL", "HDL", "LDLCALC", "TRIG", "CHOLHDL", "LDLDIRECT" in the last 72 hours. Thyroid Function Tests: No results for input(s): "TSH", "T4TOTAL", "FREET4", "T3FREE", "THYROIDAB" in the last 72 hours. Anemia Panel: No results for input(s): "VITAMINB12", "FOLATE", "FERRITIN", "TIBC", "IRON", "RETICCTPCT" in the last 72 hours. Urine analysis:    Component Value Date/Time   COLORURINE YELLOW 01/02/2022 2106   APPEARANCEUR HAZY (A) 01/02/2022 2106   APPEARANCEUR Clear 03/12/2018 1449   LABSPEC 1.017 01/02/2022 2106   PHURINE 7.0 01/02/2022 2106   GLUCOSEU NEGATIVE 01/02/2022 2106   HGBUR NEGATIVE 01/02/2022 2106   BILIRUBINUR NEGATIVE 01/02/2022 2106   BILIRUBINUR Negative 03/12/2018 [place] 01/02/2022 2106   PROTEINUR NEGATIVE 01/02/2022 2106   NITRITE NEGATIVE 01/02/2022 2106   LEUKOCYTESUR LARGE (A) 01/02/2022 2106    Radiological Exams on Admission: No results found.  EKG: Independently reviewed and showing NSR with occasional PVCs and some mild baseline wander.  Assessment/Plan Principal Problem:   Bright red rectal bleeding Active Problems:   Hx of completed stroke   Guaiac positive stools   Generalized weakness   Insomnia   History of gastric ulcer   Multiple gastric ulcers   IDA (iron deficiency anemia)   ABLA (acute blood loss anemia)   Diverticulosis  Bright red blood per rectum Likely LGIB given bright red blood, unlikely to be rapid UGIB given clinical stability.  Suspect diverticular bleed given extensive history of diverticulosis.  Notable history of gastric ulcers.  Continue to monitor and consult GI for further workup. - Pantoprazole BID - Monitor for signs of further acute bleeding - GI consulted, follow up recs  Acute blood loss anemia Chronic iron deficiency Hgb 9.8 today, was 12.1 on 2/8.  Drop likely secondary to LGIB.  Follows with Dr. Jenetta Downer outpatient for IV iron infusions for iron deficiency anemia, had one yesterday. - Trend H&H, transfuse if Hgb<7 - Ferrous sulfate 325 mg PO daily  History of stroke - On 81 mg ASA at home, holding in setting of acute GI bleed   Generalized weakness Likely secondary to blood loss, continue to monitor.  Hypercholesterolemia - Ezetimibe 10 mg daily  Insomnia - Melatonin 9 mg daily - Sertraline 25 mg daily  Rheumatoid arthritis - Hydroxychloroquine 200 mg daily - Leflunomide 10 mg daily  Neurogenic bladder - Festerodine 4 mg daily - Patient's daughter bringing solifenacin and vibegron from home  HTN - Amlodipine 5 mg daily and irbesartan 150 mg daily  Vitamin D deficiency - Cholecalciferol 1000 units daily  DVT prophylaxis: SCDs, pharmacological prophylaxis  held in setting of acute bleed Code Status: Full Family Communication: Daughter at bedside 3/13 Disposition Plan: Home after GI workup Consults called: GI Admission status: Observation  Severity of Illness: The appropriate patient status for this patient is OBSERVATION. Observation status is judged to be reasonable and necessary in order to provide the required intensity of service to ensure the patient's safety. The patient's presenting symptoms, physical exam findings, and initial radiographic and laboratory data in the context of their medical condition is felt to place them at decreased risk for further clinical deterioration. Furthermore, it is anticipated that  the patient will be medically stable for discharge from the hospital within 2 midnights of admission.   Bre [employer] engineer, MS4 [facility]  If 7PM-7AM, please contact night-coverage [email or web address]  07/04/2022, 10:05 AM

## 2022-07-04 NOTE — ED Triage Notes (Signed)
Pt presents to ED with c/o rectal bleeding. Pt states she woke up this morning to have a bowel movement and passed a large amount a bright red blood. Denies any pain.  Pt has hx of gastric ulcers.

## 2022-07-04 NOTE — Consult Note (Signed)
Gastroenterology Consult   Referring Provider: No ref. provider found Primary Care Physician:  Curlene Labrum, MD Primary Gastroenterologist:  Susanne Greenhouse   Patient ID: Aili Fiebelkorn; [id number]; Dec 18, 1940   Admit date: 07/04/2022  LOS: 0 days   Date of Consultation: 07/04/2022  Reason for Consultation:  rectal bleeding   History of Present Illness   Sarah Page is a 82 y.o. year old female with past medical history of anemia, IBS, arthritis, stroke, visual impairment and diverticulitis (2019), PUD who presented to the ED last night with painless rectal bleeding. GI consulted for further evaluation.  ED Course:  Hgb 9.8 (12.1 on 2/8) BUN normal at 21  Hemodynamically stable  Last iron studies in feb with ferritin 139, iron 26, TIBC 308, saturation 8%   Consult:  Patient well known to our team, history of IDA. Colonoscopy in September 2023 with diverticulitis throughout, EGD in February with Cameron ulcers and gastric ulcer. She was recommended to have repeat EGD August 2024 and ordered for iron infusions x2 in February due to ongoing [place]. Discussion held regarding potential hiatal hernia repair if hemoglobin/iron stores remained low or she continued with persistent ulcers contributing to her IDA.  Patient reports that about 3am she woke up to have a BM. She notes that she saw BRB in the toilet, unsure if there was any presence of stool or not. She denies abdominal pain, nausea or vomiting. She reports she ate some beans at dinner as she went out with friends to [place]. She does note that she slipped and fell onto the floor after she got off the toilet but denies syncope or dizziness at that time. She then got into the shower, she walked to her bed shortly thereafter and stomach felt a little unsettled. She denies any pain currently. She has not had any further BMs since she arrived to the hospital. She denies any NSAID use. She had 3 iron infusions then had a break due to  covid and received 2 more with last one being yesterday. She does note a small amount of rectal bleeding during her episode of covid but this was self limiting. She denies any Melena. She did resume her PO iron every other day after resolution of Covid. She has not noted any improvement in her fatigue since receiving Iron infusions. Denies any SOB.   She has history of chronic diarrhea, she notes this has remained about the same, typically having 1 to 2 BMs per day, noting that certain foods tend to worsen her symptoms.   EGD: feb 2024 - 6 cm hiatal hernia with multiple Cameron ulcers.                           - Non-bleeding gastric ulcer with a clean ulcer                            base (Forrest Class III). Biopsied.                           - Normal examined duodenum. Colonoscopy: 12/2021  - Hemorrhoids found on perianal exam.                           - Diverticulosis in the entire examined colon.                           -  The entire examined colon is normal. Biopsied.                            - Tortuous colon.                           - The distal rectum and anal verge are normal on                            retroflexion view.  Past Medical History:  Diagnosis Date   Anemia    Arthritis    Gait disturbance    Rotator cuff tear    DR [facility]   Stroke [facility])    Vision abnormalities     Past Surgical History:  Procedure Laterality Date   BIOPSY  12/26/2021   Procedure: BIOPSY;  Surgeon: Harvel Quale, MD;  [facility];  Service: Gastroenterology;;   BIOPSY  01/04/2022   Procedure: BIOPSY;  Surgeon: Harvel Quale, MD;  [facility];  Service: Gastroenterology;;   BIOPSY  06/05/2022   Procedure: BIOPSY;  Surgeon: Harvel Quale, MD;  [facility];  Service: Gastroenterology;;   COLONOSCOPY WITH PROPOFOL N/A 12/26/2021   Procedure: COLONOSCOPY WITH PROPOFOL;  Surgeon: Harvel Quale, MD;  [facility];  Service: Gastroenterology;  Laterality: N/A;  645  ASA 3   ESOPHAGOGASTRODUODENOSCOPY (EGD) WITH PROPOFOL N/A 01/04/2022   Surgeon: Montez Morita, Quillian Quince, MD;  clotted blood at GE junction, esophageal ulcer with stigmata of recent bleeding, 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 Pathology with reactive gastropathy, negative for H. pylori.  Recommended repeating EGD in 3 months   ESOPHAGOGASTRODUODENOSCOPY (EGD) WITH PROPOFOL N/A 06/05/2022   Procedure: ESOPHAGOGASTRODUODENOSCOPY (EGD) WITH PROPOFOL;  Surgeon: Harvel Quale, MD;  [facility];  Service: Gastroenterology;  Laterality: N/A;  1045am, asa 3, LM to see if pt can come earlier   ORIF CLAVICULAR FRACTURE Left     Prior to Admission medications   Medication Sig Start Date End Date Taking? Authorizing Provider  acetaminophen (TYLENOL) 650 MG CR tablet Take 650 mg by mouth every 8 (eight) hours as needed for pain (tylenol arthritis TID).   Yes [provider]  amLODipine-olmesartan (AZOR) 5-20 MG tablet Take 1 tablet by mouth daily.   Yes [provider]  aspirin EC 81 MG tablet Take 81 mg by mouth daily. Swallow whole.   Yes [provider]  cholecalciferol (VITAMIN D3) 25 MCG (1000 UNIT) tablet Take 1,000 Units by mouth daily.   Yes [provider]  ezetimibe (ZETIA) 10 MG tablet Take 10 mg by mouth daily.   Yes [provider]  ferrous sulfate 325 (65 FE) MG EC tablet Take 325 mg by mouth daily with breakfast.   Yes [provider]  hydroxychloroquine (PLAQUENIL) 200 MG tablet Take 200 mg by mouth daily. 05/25/21  Yes [provider]  leflunomide (ARAVA) 10 MG tablet Take 10 mg by mouth daily. 05/25/21  Yes [provider]  Melatonin 10 MG TABS Take 10 mg by mouth at bedtime.   Yes [provider]   multivitamin-iron-minerals-folic acid (CENTRUM) chewable tablet Chew 1 tablet by mouth daily.   Yes [provider]  pantoprazole (PROTONIX)  40 MG tablet Take 1 tablet (40 mg total) by mouth 2 (two) times daily. 06/05/22  Yes Harvel Quale, MD  sertraline (ZOLOFT) 25 MG tablet Take 25 mg by mouth daily.   Yes [provider]  solifenacin (VESICARE) 5 MG tablet Take 5 mg by mouth daily.   Yes [provider]  Vibegron (GEMTESA) 75 MG TABS Take 75 mg by mouth daily at 6 (six) AM.   Yes [provider]    Current Facility-Administered Medications  Medication Dose Route Frequency Provider Last Rate Last Admin   0.9 %  sodium chloride infusion   Intravenous Continuous Johnson, Clanford L, MD       acetaminophen (TYLENOL) tablet 650 mg  650 mg Oral Q6H PRN Johnson, Clanford L, MD       Or   acetaminophen (TYLENOL) suppository 650 mg  650 mg Rectal Q6H PRN Johnson, Clanford L, MD       ondansetron (ZOFRAN) tablet 4 mg  4 mg Oral Q6H PRN Johnson, Clanford L, MD       Or   ondansetron (ZOFRAN) injection 4 mg  4 mg Intravenous Q6H PRN Johnson, Clanford L, MD       oxyCODONE (Oxy IR/ROXICODONE) immediate release tablet 5 mg  5 mg Oral Q6H PRN Johnson, Clanford L, MD       pantoprazole (PROTONIX) EC tablet 40 mg  40 mg Oral BID Wynetta Emery, Clanford L, MD        Allergies as of 07/04/2022   (No Known Allergies)    Family History  Problem Relation Age of Onset   Stroke Mother    Transient ischemic attack Mother    Stroke Father    Heart attack Father    Mental illness Brother    Breast cancer Daughter 9       [name]    Social History   Socioeconomic History   Marital status: Widowed    Spouse name: Not on file   Number of children: Not on file   Years of education: Not on file   Highest education level: Not on file  Occupational History   Not on file  Tobacco Use   Smoking status: Never    Passive exposure: Never   Smokeless tobacco:  Never  Vaping Use   Vaping Use: Never used  Substance and Sexual Activity   Alcohol use: Yes    Comment: occasional wine   Drug use: Never   Sexual activity: Not on file  Other Topics Concern   Not on file  Social History Narrative   Not on file   Social Determinants of Health   Financial Resource Strain: Not on file  Food Insecurity: No Food Insecurity (07/04/2022)   Hunger Vital Sign    Worried About Running Out of Food in the Last Year: Never true    Ran Out of Food in the Last Year: Never true  Transportation Needs: No Transportation Needs (01/03/2022)   PRAPARE - Hydrologist (Medical): No    Lack of Transportation (Non-Medical): No  Physical Activity: Not on file  Stress: Not on file  Social Connections: Not on file  Intimate Partner Violence: Not At Risk (01/03/2022)   Humiliation, Afraid, Rape, and Kick questionnaire    Fear of Current or Ex-Partner: No    Emotionally Abused: No    Physically Abused: No    Sexually Abused: No     Review of Systems   Gen: Denies any fever,  chills, loss of appetite, change in weight or weight loss CV: Denies chest pain, heart palpitations, syncope, edema  Resp: Denies shortness of breath with rest, cough, wheezing, coughing up blood, and pleurisy. GI:  denies melena,, nausea, vomiting, diarrhea, constipation, dysphagia, odyonophagia, early satiety or weight loss. +rectal bleeding  GU : Denies urinary burning, blood in urine, urinary frequency, and urinary incontinence. MS: Denies joint pain, limitation of movement, swelling, cramps, and atrophy.  Derm: Denies rash, itching, dry skin, hives. Psych: Denies depression, anxiety, memory loss, hallucinations, and confusion. Heme: Denies bruising or bleeding Neuro:  Denies any headaches, dizziness, paresthesias, shaking  Physical Exam   Vital Signs in last 24 hours: Temp:  [97.7 F (36.5 C)] 97.7 F (36.5 C) (03/13 0858) Pulse Rate:  [69-99] 99 (03/13  0858) Resp:  [12-20] 17 (03/13 0858) BP: (109-153)/(63-82) 133/75 (03/13 0858) SpO2:  [92 %-100 %] 100 % (03/13 0858) Weight:  [50.9 kg-52.2 kg] 50.9 kg (03/13 0830)   General:   Alert,  Well-developed, well-nourished, pleasant and cooperative in NAD Head:  Normocephalic and atraumatic. Eyes:  Sclera clear, no icterus.   Conjunctiva pink. Ears:  Normal auditory acuity. Mouth:  No deformity or lesions, dentition normal. Neck:  Supple; no masses Lungs:  Clear throughout to auscultation.   No wheezes, crackles, or rhonchi. No acute distress. Heart:  Regular rate and rhythm; no murmurs, clicks, rubs,  or gallops. Abdomen:  Soft, nontender and nondistended. No masses, hepatosplenomegaly or hernias noted. Normal bowel sounds, without guarding, and without rebound.    Msk:  Symmetrical without gross deformities. Normal posture. Extremities:  Without clubbing or edema. Neurologic:  Alert and  oriented x4. Skin:  Intact without significant lesions or rashes. Psych:  Alert and cooperative. Normal mood and affect.  Labs/Studies   Recent Labs Recent Labs    07/04/22 0530  WBC 6.6  HGB 9.8*  HCT 30.9*  PLT 194   BMET Recent Labs    07/04/22 0530  NA 141  K 4.1  CL 104  CO2 28  GLUCOSE 138*  BUN 21  CREATININE 0.94  CALCIUM 8.6*   LFT Recent Labs    07/04/22 0530  PROT 6.4*  ALBUMIN 3.5  AST 45*  ALT 33  ALKPHOS 66  BILITOT 0.4   PT/INR Recent Labs    07/04/22 0530  LABPROT 12.9  INR 1.0     Assessment   Sarah Page is a 82 y.o. year old female with past medical history of anemia, IBS, arthritis, stroke, visual impairment and diverticulitis (2019), PUD who presented to the ED last night with painless rectal bleeding. GI consulted for further evaluation.  Rectal bleeing/anemia: history of IDA, thought secondary to gastric and cameron ulcers, last EGD in feb with persistent ulcer in stomach and cameron ulcers, colonoscopy in sept 2023 with diverticulosis.  She has been receiving iron infusions, last was yesterday for ongoing IDA. Hgb 1 month ago was 12.1, 9.8 on admission. She reported a painless episode of large volume BRBPR overnight.  She has loose stools at baseline but no BMs since admission. Denies SOB or dizziness, she has fatigue at baseline. Notably nursing staff reported a darker stool with streak of BRB after my assessment this morning, however she is maintained on PO iron, with last dose yesterday, low suspicion that this is related to a rapid UGIB given presentation, query transient diverticular bleed. Will monitor her hemoglobin closely, continue with PPI BID, PO daily iron pills. If further overt rectal bleeding recurs, she  will need CT Angio GI bleed scan STAT.    Plan / Recommendations    Trend h&h, transfuse for hgb <7 PPI BID Monitor for overt GI bleeding Continue PO iron CT Angio GI bleed scan STAT if overt rectal bleeding recurs  Plan for repeat EGD in August 2024   07/04/2022, 9:09 AM  Terik Haughey L. Alver Sorrow, MSN, APRN, AGNP-C Adult-Gerontology Nurse Practitioner [facility] at [facility]

## 2022-07-04 NOTE — ED Provider Notes (Signed)
[facility] Provider Note   CSN: [id number] Arrival date & time: 07/04/22  0450     History  Chief Complaint  Patient presents with   GI Bleeding    Sarah Page is a 82 y.o. female.  Patient presents to the department for evaluation of rectal bleeding.  Patient reports that she woke up this morning and felt like she needed to have a bowel movement.  Patient reports that instead of passing stool she passed a large amount of bright red blood.  Patient reports that she has a history of iron deficiency anemia and gets iron infusions.  Patient denies syncope, chest pain, racing heartbeat, shortness of breath, abdominal pain.       Home Medications Prior to Admission medications   Medication Sig Start Date End Date Taking? Authorizing Provider  acetaminophen (TYLENOL) 650 MG CR tablet Take 650 mg by mouth every 8 (eight) hours as needed for pain (tylenol arthritis TID).    [provider]  acidophilus (RISAQUAD) CAPS capsule Take by mouth daily.    [provider]  amLODipine-olmesartan (AZOR) 5-20 MG tablet Take 1 tablet by mouth daily.    [provider]  aspirin EC 81 MG tablet Take 81 mg by mouth daily. Swallow whole.    [provider]  cholecalciferol (VITAMIN D3) 25 MCG (1000 UNIT) tablet Take 1,000 Units by mouth daily.    [provider]  ezetimibe (ZETIA) 10 MG tablet Take 10 mg by mouth daily.    [provider]  ferrous sulfate 325 (65 FE) MG EC tablet Take 325 mg by mouth daily with breakfast.    [provider]  hydroxychloroquine (PLAQUENIL) 200 MG tablet Take 200 mg by mouth daily. 05/25/21   [provider]  leflunomide (ARAVA) 10 MG tablet Take 10 mg by mouth daily. 05/25/21   [provider]  Melatonin 10 MG TABS Take 10 mg by mouth at bedtime.    [provider]  multivitamin-iron-minerals-folic acid (CENTRUM) chewable tablet Chew 1  tablet by mouth daily.    [provider]  pantoprazole (PROTONIX) 40 MG tablet Take 1 tablet (40 mg total) by mouth 2 (two) times daily. 06/05/22   Harvel Quale, MD  sertraline (ZOLOFT) 25 MG tablet Take 25 mg by mouth daily.    [provider]  solifenacin (VESICARE) 5 MG tablet Take 5 mg by mouth daily.    [provider]  Vibegron (GEMTESA) 75 MG TABS Take 75 mg by mouth daily at 6 (six) AM.    [provider]      Allergies    Patient has no known allergies.    Review of Systems   Review of Systems  Physical Exam Updated Vital Signs BP 120/63   Pulse 76   Temp 97.7 F (36.5 C) (Oral)   Resp 14   Ht '5\' 4"'$  (1.626 m)   Wt 52.2 kg   SpO2 100%   BMI 19.74 kg/m  Physical Exam Vitals and nursing note reviewed.  Constitutional:      General: She is not in acute distress.    Appearance: She is well-developed.  HENT:     Head: Normocephalic and atraumatic.     Mouth/Throat:     Mouth: Mucous membranes are moist.  Eyes:     General: Vision grossly intact. Gaze aligned appropriately.     Extraocular Movements: Extraocular movements intact.     Conjunctiva/sclera: Conjunctivae normal.  Cardiovascular:     Rate and Rhythm: Normal rate and regular rhythm.     Pulses: Normal pulses.     Heart sounds: Normal heart sounds, S1 normal and S2 normal. No murmur heard.    No friction rub. No gallop.  Pulmonary:     Effort: Pulmonary effort is normal. No respiratory distress.     Breath sounds: Normal breath sounds.  Abdominal:     General: Bowel sounds are normal.     Palpations: Abdomen is soft.     Tenderness: There is no abdominal tenderness. There is no guarding or rebound.     Hernia: No hernia is present.  Musculoskeletal:        General: No swelling.     Cervical back: Full passive range of motion without pain, normal range of motion and neck supple. No spinous process tenderness or muscular tenderness. Normal range of motion.      Right lower leg: No edema.     Left lower leg: No edema.  Skin:    General: Skin is warm and dry.     Capillary Refill: Capillary refill takes less than 2 seconds.     Findings: No ecchymosis, erythema, rash or wound.  Neurological:     General: No focal deficit present.     Mental Status: She is alert and oriented to person, place, and time.     GCS: GCS eye subscore is 4. GCS verbal subscore is 5. GCS motor subscore is 6.     Cranial Nerves: Cranial nerves 2-12 are intact.     Sensory: Sensation is intact.     Motor: Motor function is intact.     Coordination: Coordination is intact.  Psychiatric:        Attention and Perception: Attention normal.        Mood and Affect: Mood normal.        Speech: Speech normal.        Behavior: Behavior normal.     ED Results / Procedures / Treatments   Labs (all labs ordered are listed, but only abnormal results are displayed) Labs Reviewed  CBC WITH DIFFERENTIAL/PLATELET - Abnormal; Notable for the following components:      Result Value   RBC 3.18 (*)    Hemoglobin 9.8 (*)    HCT 30.9 (*)    Lymphs Abs 0.6 (*)    All other components within normal limits  COMPREHENSIVE METABOLIC PANEL - Abnormal; Notable for the following components:   Glucose, Bld 138 (*)    Calcium 8.6 (*)    Total Protein 6.4 (*)    AST 45 (*)    All other components within normal limits  LACTIC ACID, PLASMA  PROTIME-INR  TYPE AND SCREEN    EKG EKG Interpretation  Date/Time:  Wednesday July 04 2022 05:17:16 EDT Ventricular Rate:  75 PR Interval:  135 QRS Duration: 115 QT Interval:  415 QTC Calculation: 464 R Axis:   96 Text Interpretation: Sinus rhythm Multiple ventricular premature complexes Nonspecific intraventricular conduction delay Low voltage, extremity leads Nonspecific ST and T wave abnormality No significant change since last tracing Confirmed by Orpah Greek [phone]) on 07/04/2022 5:46:47 AM  Radiology No results  found.  Procedures Procedures    Medications Ordered in ED Medications - No data to display  ED Course/ Medical Decision Making/ A&P  Medical Decision Making Amount and/or Complexity of Data Reviewed Labs: ordered.  Risk Decision regarding hospitalization.   Patient presents to the emergency department for evaluation of bright red blood per rectum  Differential diagnosis considered includes, but not limited to: Ischemic bowel, diverticulitis, colitis, AVM, brisk upper GI bleed, diverticular bleed  Patient presents with painless passage of bright red blood per rectum.  She did have an urge to defecate but only passed blood.  Patient has a history of iron deficiency anemia.  She is followed by GI.  Colonoscopy in September revealed diffuse diverticular disease throughout the colon.  EGD 1 month ago revealed ulceration of paraesophageal hiatal hernia as well as a gastric ulcer.  Patient receiving IV infusions of iron for her anemia which is felt to be secondary to the ulcer.  Hemoglobin 9.8.  She has been as high as 12 after the infusions.  This appears to be at least a 2 g drop for her.  She reports large volume of bleeding prior to coming to the ED.  She does have a known gastric ulcer.  She does not appear ill enough for this to be a brisk upper GI bleed, but cannot rule out.  Her last colonoscopy showed extensive diverticular disease and her bleeding is likely secondary to the diverticulosis.  He is not experiencing any pain, no tenderness, doubt acute diverticulitis.  Will need monitoring to ensure that her H&H is stabilized, does not require blood transfusion again.        Final Clinical Impression(s) / ED Diagnoses Final diagnoses:  Lower GI bleed    Rx / DC Orders ED Discharge Orders     None         Orpah Greek, MD 07/04/22 [phone]

## 2022-07-05 ENCOUNTER — Other Ambulatory Visit: Payer: Self-pay

## 2022-07-05 ENCOUNTER — Telehealth: Payer: Self-pay | Admitting: Gastroenterology

## 2022-07-05 DIAGNOSIS — R634 Abnormal weight loss: Secondary | ICD-10-CM

## 2022-07-05 DIAGNOSIS — D509 Iron deficiency anemia, unspecified: Secondary | ICD-10-CM

## 2022-07-05 DIAGNOSIS — E7849 Other hyperlipidemia: Secondary | ICD-10-CM | POA: Diagnosis not present

## 2022-07-05 DIAGNOSIS — R739 Hyperglycemia, unspecified: Secondary | ICD-10-CM | POA: Diagnosis not present

## 2022-07-05 DIAGNOSIS — K5790 Diverticulosis of intestine, part unspecified, without perforation or abscess without bleeding: Secondary | ICD-10-CM

## 2022-07-05 DIAGNOSIS — I10 Essential (primary) hypertension: Secondary | ICD-10-CM | POA: Diagnosis not present

## 2022-07-05 DIAGNOSIS — Z8719 Personal history of other diseases of the digestive system: Secondary | ICD-10-CM

## 2022-07-05 DIAGNOSIS — K259 Gastric ulcer, unspecified as acute or chronic, without hemorrhage or perforation: Secondary | ICD-10-CM | POA: Diagnosis not present

## 2022-07-05 DIAGNOSIS — K625 Hemorrhage of anus and rectum: Secondary | ICD-10-CM | POA: Diagnosis not present

## 2022-07-05 DIAGNOSIS — K750 Abscess of liver: Secondary | ICD-10-CM | POA: Diagnosis not present

## 2022-07-05 DIAGNOSIS — R748 Abnormal levels of other serum enzymes: Secondary | ICD-10-CM | POA: Diagnosis not present

## 2022-07-05 DIAGNOSIS — N1831 Chronic kidney disease, stage 3a: Secondary | ICD-10-CM | POA: Diagnosis not present

## 2022-07-05 DIAGNOSIS — R7303 Prediabetes: Secondary | ICD-10-CM | POA: Diagnosis not present

## 2022-07-05 DIAGNOSIS — Z8711 Personal history of peptic ulcer disease: Secondary | ICD-10-CM | POA: Diagnosis not present

## 2022-07-05 DIAGNOSIS — R195 Other fecal abnormalities: Secondary | ICD-10-CM | POA: Diagnosis not present

## 2022-07-05 LAB — CBC
HCT: 26.2 % — ABNORMAL LOW (ref 36.0–46.0)
Hemoglobin: 8.2 g/dL — ABNORMAL LOW (ref 12.0–15.0)
MCH: 30.8 pg (ref 26.0–34.0)
MCHC: 31.3 g/dL (ref 30.0–36.0)
MCV: 98.5 fL (ref 80.0–100.0)
Platelets: 161 10*3/uL (ref 150–400)
RBC: 2.66 MIL/uL — ABNORMAL LOW (ref 3.87–5.11)
RDW: 13.3 % (ref 11.5–15.5)
WBC: 6.5 10*3/uL (ref 4.0–10.5)
nRBC: 0 % (ref 0.0–0.2)

## 2022-07-05 LAB — CBC WITH DIFFERENTIAL/PLATELET
Abs Immature Granulocytes: 0.01 10*3/uL (ref 0.00–0.07)
Basophils Absolute: 0.1 10*3/uL (ref 0.0–0.1)
Basophils Relative: 2 %
Eosinophils Absolute: 0.2 10*3/uL (ref 0.0–0.5)
Eosinophils Relative: 3 %
HCT: 24.6 % — ABNORMAL LOW (ref 36.0–46.0)
Hemoglobin: 7.8 g/dL — ABNORMAL LOW (ref 12.0–15.0)
Immature Granulocytes: 0 %
Lymphocytes Relative: 13 %
Lymphs Abs: 0.7 10*3/uL (ref 0.7–4.0)
MCH: 31.1 pg (ref 26.0–34.0)
MCHC: 31.7 g/dL (ref 30.0–36.0)
MCV: 98 fL (ref 80.0–100.0)
Monocytes Absolute: 0.5 10*3/uL (ref 0.1–1.0)
Monocytes Relative: 11 %
Neutro Abs: 3.4 10*3/uL (ref 1.7–7.7)
Neutrophils Relative %: 71 %
Platelets: 161 10*3/uL (ref 150–400)
RBC: 2.51 MIL/uL — ABNORMAL LOW (ref 3.87–5.11)
RDW: 13.5 % (ref 11.5–15.5)
WBC: 4.9 10*3/uL (ref 4.0–10.5)
nRBC: 0 % (ref 0.0–0.2)

## 2022-07-05 LAB — BASIC METABOLIC PANEL
Anion gap: 8 (ref 5–15)
BUN: 13 mg/dL (ref 8–23)
CO2: 26 mmol/L (ref 22–32)
Calcium: 8 mg/dL — ABNORMAL LOW (ref 8.9–10.3)
Chloride: 106 mmol/L (ref 98–111)
Creatinine, Ser: 0.8 mg/dL (ref 0.44–1.00)
GFR, Estimated: >60 mL/min (ref >60)
Glucose, Bld: 115 mg/dL — ABNORMAL HIGH (ref 70–99)
Potassium: 3.4 mmol/L — ABNORMAL LOW (ref 3.5–5.1)
Sodium: 140 mmol/L (ref 135–145)

## 2022-07-05 LAB — MAGNESIUM: Magnesium: 1.9 mg/dL (ref 1.7–2.4)

## 2022-07-05 MED ORDER — POTASSIUM CHLORIDE CRYS ER 20 MEQ PO TBCR
40.0000 meq | EXTENDED_RELEASE_TABLET | Freq: Once | ORAL | Status: AC
  Administered 2022-07-05: 40 meq via ORAL
  Filled 2022-07-05: qty 2

## 2022-07-05 NOTE — Progress Notes (Signed)
Gastroenterology Progress Note   Referring Provider: No ref. provider found Primary Care Physician:  Curlene Labrum, MD Primary Gastroenterologist:  Dr. Jenetta Downer  Patient ID: Sarah Page; [id number]; 1941-01-13   Subjective:    Patient really wants to go home. She is not resting well. She reports having a small amount of red blood on her briefs this morning. Denies significant bleeding since admission. Small stool yesterday with streaks of blood. Denies abdominal pain.   Objective:   Vital signs in last 24 hours: Temp:  [97.5 F (36.4 C)-98.4 F (36.9 C)] 98.4 F (36.9 C) (03/14 0506) Pulse Rate:  [74-99] 83 (03/14 0506) Resp:  [12-18] 16 (03/14 0506) BP: (114-133)/(66-75) 117/67 (03/14 0506) SpO2:  [92 %-100 %] 92 % (03/14 0506) Weight:  [50.9 kg] 50.9 kg (03/13 0830) Last BM Date : 07/04/22 General:   Alert,  Well-developed, well-nourished, pleasant and cooperative in NAD. Daughter at bedside. Head:  Normocephalic and atraumatic. Eyes:  Sclera clear, no icterus.   Abdomen:  Soft, nontender and nondistended.  Normal bowel sounds, without guarding, and without rebound.   Extremities:  Without clubbing, deformity or edema. Neurologic:  Alert and  oriented x4;  grossly normal neurologically. Skin:  Intact without significant lesions or rashes. Psych:  Alert and cooperative. Normal mood and affect.  Intake/Output from previous day: 03/13 0701 - 03/14 0700 In: 970.5 [P.O.:720; I.V.:250.5] Out: -  Intake/Output this shift: No intake/output data recorded.  Lab Results: CBC Recent Labs    07/04/22 0530 07/04/22 1242 07/05/22 0418  WBC 6.6 4.1 6.5  HGB 9.8* 8.9* 8.2*  HCT 30.9* 28.0* 26.2*  MCV 97.2 96.6 98.5  PLT 194 182 161   BMET Recent Labs    07/04/22 0530 07/05/22 0418  NA 141 140  K 4.1 3.4*  CL 104 106  CO2 28 26  GLUCOSE 138* 115*  BUN 21 13  CREATININE 0.94 0.80  CALCIUM 8.6* 8.0*   LFTs Recent Labs    07/04/22 0530  BILITOT 0.4   ALKPHOS 66  AST 45*  ALT 33  PROT 6.4*  ALBUMIN 3.5   No results for input(s): "LIPASE" in the last 72 hours. PT/INR Recent Labs    07/04/22 0530  LABPROT 12.9  INR 1.0         Imaging Studies: No results found.[2 weeks]  Assessment:   Pleasant 82 year old female with past medical history of anemia, IBS, arthritis, stroke, visual impairment, diverticulitis, peptic ulcer disease who presented to the ED with painless rectal bleeding.  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  She has been receiving IV iron.  Hemoglobin 1 month ago 12.1, 9.8 on admission.  Down to 8.2 today.  Presents with painless episode of large-volume bright red blood per rectum.  Suspected diverticular bleed.  Very unlikely related to rapid upper GI bleed given clinical presentation.      Plan:   PPI twice daily. CT angio GI bleed scan stat if overt rectal bleeding recurs. Repeat CBC at noon. If stable Hgb, consider discharge later today.  Once discharged, patient will consume bland low fiber diet over the next several days and slowly add fiber back to diet. Recommend daily fiber supplement, at least 3-4 grams daily.  We will arrange for outpatient follow up.   LOS: 0 days   Laureen Ochs. [facility] [phone] 3/14/20248:21 AM

## 2022-07-05 NOTE — Telephone Encounter (Signed)
Please make that appt for 2 week follow up (per Dr. Gala Romney).   Please arrange for CBC on 07/09/22.

## 2022-07-05 NOTE — Progress Notes (Signed)
Patient discharged home today, transported home by family. Discharge summary went over with patient, patient verbalized understanding. Belongings sent home with patient.  

## 2022-07-05 NOTE — TOC Progression Note (Addendum)
  Transition of Care [facility]) Screening Note   Patient Details  Name: Amandalee Lacap Date of Birth: 25-May-1940   Transition of Care [facility]) CM/SW Contact:    Boneta Lucks, RN Phone Number: 07/05/2022, 11:26 AM   TOC consulted for [facility], then cancelled, GI set up outpatient labs to be drawn on 3/18.  Transition of Care Department [facility]) has reviewed patient and no TOC needs have been identified at this time. We will continue to monitor patient advancement through interdisciplinary progression rounds. If new patient transition needs arise, please place a TOC consult.    Expected Discharge Plan: Home/Self Care Barriers to Discharge: Continued Medical Work up  Expected Discharge Plan and Services    Home  Social Determinants of Health (SDOH) Interventions SDOH Screenings   Food Insecurity: No Food Insecurity (07/04/2022)  Housing: Low Risk  (07/04/2022)  Transportation Needs: No Transportation Needs (07/04/2022)  Utilities: Not At Risk (07/04/2022)  Tobacco Use: Low Risk  (07/04/2022)

## 2022-07-05 NOTE — Telephone Encounter (Signed)
Lmom for pt to return my call.  

## 2022-07-05 NOTE — Discharge Summary (Signed)
Physician Discharge Summary  Sarah Page [id number] DOB: [birth year] DOA: 07/04/2022  PCP: Curlene Labrum, MD GI:  [facility] : [place]  Admit date: 07/04/2022  Discharge date: 07/05/2022  Admitted From: Home  Disposition: Home  Recommendations for Outpatient Follow-up:  Follow up with PCP in 1-2 weeks. Per Dr. Gala Romney, okay to discharge today but needs CBC on 3/18 for close monitoring; GI office visit for labs 3/18. Outpatient follow up with GI in 2 weeks with Dr. Abbey Chatters. Recommend hold aspirin until outpatient GI follow up visit to discuss further  Consume bland low fiber diet over the next several days and slowly add fiber back to diet.    Recommend daily fiber supplement, at least 3-4 grams daily    Please Hold daily aspirin until follow up with your gastroenterologist to be sure it is safe to restart.     Please have your CBC checked with [facility] on 07/09/22.    Please follow up with [facility] to see provider in 2 weeks.     Home Health: None  Equipment/Devices: None  Discharge Condition: Stable  CODE STATUS: Full  Diet recommendation:  bland, low fiber diet over next 3 days then slowly add back fiber to diet; then recommend starting daily fiber supplement at least  3-4 grams.    Brief/Interim Summary: Sarah Page is a 82 y.o. female with medical history significant for diverticulosis, peptic ulcer, iron deficiency anemia, past stroke now on ASA, and rheumatoid arthritis presenting with bright red rectal bleeding morning of 3/13 during a bowel movement.  Hgb 9.8 on arrival.  No indication for transfusion and hospitalist consulted for admission for blood loss anemia.  She follows outpatient with GI Dr. Jenetta Downer for iron infusions, her last one was 3/12.  She was hospitalized in February 2024 for upper GI bleed and EGD showed multiple gastric ulcers.  Did receive transfusion that hospitalization.  September 2023 colonoscopy showing extensive  diverticulosis.  GI was consulted and agreed with likely lower GI bleed secondary to diverticulosis, recommending outpatient follow-up and no scope at this time.  Patient hemoglobin held steady with slight downward trend consistent with hemodilution on 3/14 she was discharged on p.o. iron and with 3/18 follow up labs as well as close outpatient GI follow-up planned for 2 weeks from now.  Discharge Diagnoses:  Principal Problem:   Bright red rectal bleeding Active Problems:   Hx of completed stroke   Guaiac positive stools   Generalized weakness   Insomnia   History of gastric ulcer   Multiple gastric ulcers   IDA (iron deficiency anemia)   ABLA (acute blood loss anemia)   Diverticulosis  Hospitalized with acute anemia 2/2 bright red blood per rectum in the setting of known diverticulosis.  Bright red blood per rectum Diverticulosis Large episode of bleeding AM 3/13 prior to admission.  One small episode of bleeding the overnight from 3/13 to 3/14.  Per GI consult they are in agreement that this is likely LGIB 2/2 diverticulosis.  Stable at discharge without signs of acute bleeding. - Received pantoprazole BID and PO iron supplementation   Acute blood loss anemia Chronic iron deficiency Hgb trend 9.8>8.9>8.2>7.8, will follow up outpatient with GI lab visit 3/18. - Continue oral iron supplementation at discharge - Repeat CBC 3/18   History of stroke - On 81 mg ASA at home, held in setting of acute GI bleed; continue holding at discharge pending GI follow-up outpatient with Dr. Abbey Chatters.  Pt is having a  CBC checked 07/09/22 with [facility] office.     Generalized weakness Likely secondary to blood loss.  Hypercholesterolemia - Ezetimibe 10 mg daily continued   Insomnia Depression - Melatonin 9 mg daily continued - Sertraline 25 mg daily continued   Rheumatoid arthritis - Hydroxychloroquine 200 mg daily - Leflunomide 10 mg daily continued   Neurogenic bladder - Festerodine  4 mg daily - Patient's daughter brought solifenacin and vibegron from home   HTN - Amlodipine 5 mg daily and irbesartan 150 mg daily   Vitamin D deficiency - Cholecalciferol 1000 units daily  Discharge Instructions   Allergies as of 07/05/2022   No Known Allergies      Medication List     STOP taking these medications    aspirin EC 81 MG tablet       TAKE these medications    acetaminophen 650 MG CR tablet Commonly known as: TYLENOL Take 650 mg by mouth every 8 (eight) hours as needed for pain (tylenol arthritis TID).   amLODipine-olmesartan 5-20 MG tablet Commonly known as: AZOR Take 1 tablet by mouth daily.   cholecalciferol 25 MCG (1000 UNIT) tablet Commonly known as: VITAMIN D3 Take 1,000 Units by mouth daily.   ezetimibe 10 MG tablet Commonly known as: ZETIA Take 10 mg by mouth daily.   ferrous sulfate 325 (65 FE) MG EC tablet Take 325 mg by mouth daily with breakfast.   Gemtesa 75 MG Tabs Generic drug: Vibegron Take 75 mg by mouth daily at 6 (six) AM.   hydroxychloroquine 200 MG tablet Commonly known as: PLAQUENIL Take 200 mg by mouth daily.   leflunomide 10 MG tablet Commonly known as: ARAVA Take 10 mg by mouth daily.   Melatonin 10 MG Tabs Take 10 mg by mouth at bedtime.   multivitamin-iron-minerals-folic acid chewable tablet Chew 1 tablet by mouth daily.   pantoprazole 40 MG tablet Commonly known as: PROTONIX Take 1 tablet (40 mg total) by mouth 2 (two) times daily.   sertraline 25 MG tablet Commonly known as: ZOLOFT Take 25 mg by mouth daily.   solifenacin 5 MG tablet Commonly known as: VESICARE Take 5 mg by mouth daily.        Follow-up Information     [facility]. Go on 07/09/2022.   Why: For CBC check on 07/09/22 Contact information: [place] [phone]        [place]. Schedule an appointment as soon as possible for  a visit in 2 week(s).   Why: Hospital Follow Up Contact information: [address] [place] [phone]               No Known Allergies  Consultations: Gastroenterology  Procedures/Studies: No results found.   Discharge Exam: Vitals:   07/05/22 0506 07/05/22 0957  BP: 117/67 131/71  Pulse: 83   Resp: 16   Temp: 98.4 F (36.9 C)   SpO2: 92%    Vitals:   07/04/22 1350 07/04/22 2055 07/05/22 0506 07/05/22 0957  BP: 122/71 122/68 117/67 131/71  Pulse: 90 86 83   Resp:  18 16   Temp: (!) 97.5 F (36.4 C) 98.4 F (36.9 C) 98.4 F (36.9 C)   TempSrc: Oral Oral Oral   SpO2: 95% 97% 92%   Weight:      Height:        General: Thin elderly female resting in bed, no acute distress.  Pleasant and appropriate. HEENT: PERRLA.  NCAT.  Mild mucosal pallor. Lungs: CTAB.  Normal work of breathing room air. Cardiovascular: RRR with occasional irregular beat.  Normal S1/S2.  No murmurs/rubs/gallops. Abdomen: Nontender to palpation.  Soft, nondistended. GU: No suprapubic tenderness. Extremities: Warm, perfusing.  Capillary refill less than 2 seconds.  Peripheral pulses 2+ bilaterally. Skin: Warm, dry.  No rashes or lesions grossly. Neuro: Alert and oriented x 4.  No focal neurological deficit.  The results of significant diagnostics from this hospitalization (including imaging, microbiology, ancillary and laboratory) are listed below for reference.    Microbiology: No results found for this or any previous visit (from the past 240 hour(s)).   Labs: BNP (last 3 results) No results for input(s): "BNP" in the last 8760 hours. Basic Metabolic Panel: Recent Labs  Lab 07/04/22 0530 07/05/22 0418  NA 141 140  K 4.1 3.4*  CL 104 106  CO2 28 26  GLUCOSE 138* 115*  BUN 21 13  CREATININE 0.94 0.80  CALCIUM 8.6* 8.0*  MG  --  1.9   Liver Function Tests: Recent Labs  Lab 07/04/22 0530  AST 45*  ALT 33  ALKPHOS 66  BILITOT 0.4  PROT  6.4*  ALBUMIN 3.5   No results for input(s): "LIPASE", "AMYLASE" in the last 168 hours. No results for input(s): "AMMONIA" in the last 168 hours. CBC: Recent Labs  Lab 07/04/22 0530 07/04/22 1242 07/05/22 0418 07/05/22 1203  WBC 6.6 4.1 6.5 4.9  NEUTROABS 5.1  --   --  3.4  HGB 9.8* 8.9* 8.2* 7.8*  HCT 30.9* 28.0* 26.2* 24.6*  MCV 97.2 96.6 98.5 98.0  PLT 194 182 161 161   Cardiac Enzymes: No results for input(s): "CKTOTAL", "CKMB", "CKMBINDEX", "TROPONINI" in the last 168 hours. BNP: Invalid input(s): "POCBNP" CBG: No results for input(s): "GLUCAP" in the last 168 hours. D-Dimer No results for input(s): "DDIMER" in the last 72 hours. Hgb A1c No results for input(s): "HGBA1C" in the last 72 hours. Lipid Profile No results for input(s): "CHOL", "HDL", "LDLCALC", "TRIG", "CHOLHDL", "LDLDIRECT" in the last 72 hours. Thyroid function studies No results for input(s): "TSH", "T4TOTAL", "T3FREE", "THYROIDAB" in the last 72 hours.  Invalid input(s): "FREET3" Anemia work up No results for input(s): "VITAMINB12", "FOLATE", "FERRITIN", "TIBC", "IRON", "RETICCTPCT" in the last 72 hours. Urinalysis    Component Value Date/Time   COLORURINE YELLOW 01/02/2022 2106   APPEARANCEUR HAZY (A) 01/02/2022 2106   APPEARANCEUR Clear 03/12/2018 1449   LABSPEC 1.017 01/02/2022 2106   PHURINE 7.0 01/02/2022 2106   GLUCOSEU NEGATIVE 01/02/2022 2106   HGBUR NEGATIVE 01/02/2022 2106   BILIRUBINUR NEGATIVE 01/02/2022 2106   BILIRUBINUR Negative 03/12/2018 [place] 01/02/2022 2106   PROTEINUR NEGATIVE 01/02/2022 2106   NITRITE NEGATIVE 01/02/2022 2106   LEUKOCYTESUR LARGE (A) 01/02/2022 2106   Sepsis Labs Recent Labs  Lab 07/04/22 0530 07/04/22 1242 07/05/22 0418 07/05/22 1203  WBC 6.6 4.1 6.5 4.9   Microbiology No results found for this or any previous visit (from the past 240 hour(s)).   Time coordinating discharge: 35 minutes  SIGNED:   Dimitry Shitarev,  MS4 [facility] 07/05/2022, 2:21 PM  If 7PM-7AM, please contact night-coverage [email or web address]  ATTENDING NOTE  Patient seen and examined with Nemiah Commander, Medical student. In addition to supervising the encounter, I played a key role in the decision making process as well as reviewed key findings.   Discussed with Dr. Gala Romney and Neil Crouch with [facility].  Pt can discharge home today with close  outpatient CBC check on 3/18 with [facility] GI office and they also are arranging a 2 week follow up with [facility] to see provider. Pt requested to discharge today and did not want to stay another night. Pt verbalized understanding to discharge instructions and need for labs and close outpatient followup.  Pt will hold aspirin until follow up CBC lab test and outpatient GI followup to discuss when she can safely restart. Pt stable to discharge home.    Gerlene Fee, MD DipACD  How to contact the [facility] Attending or Consulting provider [place] or covering provider during after hours [place], for this patient?  Check the care team in [facility] and look for a) attending/consulting [facility] provider listed and b) the [facility] team listed Log into [email or web address] and use [place]'s universal password to access. If you do not have the password, please contact the hospital operator. Locate the [facility] provider you are looking for under [facility] and page to a number that you can be directly reached. If you still have difficulty reaching the provider, please page the [facility] (Director on Call) for the Hospitalists listed on amion for assistance.

## 2022-07-05 NOTE — Care Management Obs Status (Signed)
[place] NOTIFICATION   Patient Details  Name: Sarah Page MRN: [id number] Date of Birth: October 09, 1940   Medicare Observation Status Notification Given:  Yes    Tommy Medal 07/05/2022, 2:19 PM

## 2022-07-05 NOTE — Plan of Care (Signed)
  Problem: Health Behavior/Discharge Planning: Goal: Ability to manage health-related needs will improve Outcome: Progressing   

## 2022-07-05 NOTE — Discharge Instructions (Addendum)
Consume bland low fiber diet over the next several days and slowly add fiber back to diet.   Recommend daily fiber supplement, at least 3-4 grams daily   Please Hold daily aspirin until follow up with your gastroenterologist to be sure it is safe to restart.    Please have your CBC checked with [facility] on 07/09/22.   Please follow up with [facility] to see provider in 2 weeks.      IMPORTANT INFORMATION: PAY CLOSE ATTENTION   PHYSICIAN DISCHARGE INSTRUCTIONS  Follow with Primary care provider  [place], Virgina Evener, MD  and other consultants as instructed by your Hospitalist Physician  [place] IF SYMPTOMS COME BACK, WORSEN OR NEW PROBLEM DEVELOPS   Please note: You were cared for by a hospitalist during your hospital stay. Every effort will be made to forward records to your primary care provider.  You can request that your primary care provider send for your hospital records if they have not received them.  Once you are discharged, your primary care physician will handle any further medical issues. Please note that NO REFILLS for any discharge medications will be authorized once you are discharged, as it is imperative that you return to your primary care physician (or establish a relationship with a primary care physician if you do not have one) for your post hospital discharge needs so that they can reassess your need for medications and monitor your lab values.  Please get a complete blood count and chemistry panel checked by your Primary MD at your next visit, and again as instructed by your Primary MD.  Get Medicines reviewed and adjusted: Please take all your medications with you for your next visit with your Primary MD  Laboratory/radiological data: Please request your Primary MD to go over all hospital tests and procedure/radiological results at the follow up, please ask your primary care provider to get all Hospital records sent to  his/her office.  In some cases, they will be blood work, cultures and biopsy results pending at the time of your discharge. Please request that your primary care provider follow up on these results.  If you are diabetic, please bring your blood sugar readings with you to your follow up appointment with primary care.    Please call and make your follow up appointments as soon as possible.    Also Note the following: If you experience worsening of your admission symptoms, develop shortness of breath, life threatening emergency, suicidal or homicidal thoughts you must seek medical attention immediately by calling 911 or calling your MD immediately  if symptoms less severe.  You must read complete instructions/literature along with all the possible adverse reactions/side effects for all the Medicines you take and that have been prescribed to you. Take any new Medicines after you have completely understood and accpet all the possible adverse reactions/side effects.   Do not drive when taking Pain medications or sleeping medications (Benzodiazepines)  Do not take more than prescribed Pain, Sleep and Anxiety Medications. It is not advisable to combine anxiety,sleep and pain medications without talking with your primary care practitioner  Special Instructions: If you have smoked or chewed Tobacco  in the last 2 yrs please stop smoking, stop any regular Alcohol  and or any Recreational drug use.  Wear Seat belts while driving.  Do not drive if taking any narcotic, mind altering or controlled substances or recreational drugs or alcohol.

## 2022-07-05 NOTE — Telephone Encounter (Signed)
Patient needs a hospital follow up in 3-4 weeks. Patient preference with Dr. Jenetta Downer, Cyril Mourning, or myself.

## 2022-07-06 NOTE — Telephone Encounter (Signed)
Lmom for pt to return my call.  

## 2022-07-09 ENCOUNTER — Other Ambulatory Visit (INDEPENDENT_AMBULATORY_CARE_PROVIDER_SITE_OTHER): Payer: Self-pay | Admitting: *Deleted

## 2022-07-09 DIAGNOSIS — D509 Iron deficiency anemia, unspecified: Secondary | ICD-10-CM | POA: Diagnosis not present

## 2022-07-09 DIAGNOSIS — R634 Abnormal weight loss: Secondary | ICD-10-CM

## 2022-07-09 NOTE — Telephone Encounter (Signed)
Pt picked lab orders up to have completed.

## 2022-07-10 LAB — CBC WITH DIFFERENTIAL/PLATELET
Basophils Absolute: 0.1 10*3/uL (ref 0.0–0.2)
Basos: 1 %
EOS (ABSOLUTE): 0.3 10*3/uL (ref 0.0–0.4)
Eos: 6 %
Hematocrit: 28.6 % — ABNORMAL LOW (ref 34.0–46.6)
Hemoglobin: 9 g/dL — ABNORMAL LOW (ref 11.1–15.9)
Immature Grans (Abs): 0 10*3/uL (ref 0.0–0.1)
Immature Granulocytes: 0 %
Lymphocytes Absolute: 0.8 10*3/uL (ref 0.7–3.1)
Lymphs: 17 %
MCH: 30.3 pg (ref 26.6–33.0)
MCHC: 31.5 g/dL (ref 31.5–35.7)
MCV: 96 fL (ref 79–97)
Monocytes Absolute: 0.5 10*3/uL (ref 0.1–0.9)
Monocytes: 9 %
Neutrophils Absolute: 3.3 10*3/uL (ref 1.4–7.0)
Neutrophils: 67 %
Platelets: 253 10*3/uL (ref 150–450)
RBC: 2.97 x10E6/uL — ABNORMAL LOW (ref 3.77–5.28)
RDW: 13.1 % (ref 11.7–15.4)
WBC: 5 10*3/uL (ref 3.4–10.8)

## 2022-07-11 ENCOUNTER — Telehealth (INDEPENDENT_AMBULATORY_CARE_PROVIDER_SITE_OTHER): Payer: Self-pay | Admitting: Gastroenterology

## 2022-07-11 NOTE — Telephone Encounter (Signed)
Can someone call and let her know her appointment was rescheduled.

## 2022-07-11 NOTE — Telephone Encounter (Signed)
Noted  

## 2022-07-11 NOTE — Telephone Encounter (Signed)
Patient left voice mail message stating someone had called her about rescheduling something - please advise - [phone]

## 2022-07-13 DIAGNOSIS — I10 Essential (primary) hypertension: Secondary | ICD-10-CM | POA: Diagnosis not present

## 2022-07-13 DIAGNOSIS — Z0001 Encounter for general adult medical examination with abnormal findings: Secondary | ICD-10-CM | POA: Diagnosis not present

## 2022-07-13 DIAGNOSIS — E039 Hypothyroidism, unspecified: Secondary | ICD-10-CM | POA: Diagnosis not present

## 2022-07-13 DIAGNOSIS — N1831 Chronic kidney disease, stage 3a: Secondary | ICD-10-CM | POA: Diagnosis not present

## 2022-07-13 DIAGNOSIS — D649 Anemia, unspecified: Secondary | ICD-10-CM | POA: Diagnosis not present

## 2022-07-13 DIAGNOSIS — E7849 Other hyperlipidemia: Secondary | ICD-10-CM | POA: Diagnosis not present

## 2022-07-13 DIAGNOSIS — F17210 Nicotine dependence, cigarettes, uncomplicated: Secondary | ICD-10-CM | POA: Diagnosis not present

## 2022-07-25 ENCOUNTER — Ambulatory Visit: Payer: Medicare PPO | Admitting: Gastroenterology

## 2022-07-25 NOTE — Progress Notes (Deleted)
GI Office Note    Referring Provider: Curlene Labrum, MD Primary Care Physician:  Curlene Labrum, MD  Primary Gastroenterologist: Dr. Jenetta Downer  Chief Complaint   No chief complaint on file.   History of Present Illness   Sarah Page is a 82 y.o. female presenting today for follow up. 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 She has also been experiencing weight loss in the setting of lack of appetite since early 2023.  Recent admission with rectal bleeding suspected diverticular bleeding.   Evaluation of diarrhea has included negative celiac serologies. C. difficile, GI path panel, O&P negative in July 2023. TSH nml in May 2023. She noticed improvement in August with coconut and low FODMAP diet, avoiding identified triggers including applesauce, garlic, cheese, and diet pepsi.    Colonoscopy 12/26/2021 external hemorrhoids, diverticulosis in the entire colon, otherwise normal-appearing colonic mucosa biopsied, tortuous colon.  Colon biopsies were benign.  She was started on colestipol(patient never started).    Admitted 9/12-9/15 after presenting with nausea, vomiting, melena, and coffee ground emesis x 2 days, associated chest heaviness and abdominal cramping/discomfort.  Also with diarrhea.  Noted patient has lost approximately 25 pounds.  She denied GERD, early satiety, postprandial abdominal pain, NSAID use, but reported she just did not have an appetite.  She was found to have normocytic anemia with hemoglobin 11.8, FOBT positive.  CT A/P with contrast with acute diverticulitis/proctitis of the sigmoid colon and rectum, large hiatal hernia with mild thickening of the walls of the gastric antrum with gastric mucosal enhancement.  She was treated with  antibiotics for diverticulitis while inpatient, but no antibiotics at discharge.  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  Pathology with reactive gastropathy, negative for H. Pylori. Recommended PPI twice daily, repeat EGD in 3 months, start oral iron daily.  During admission, hemoglobin declined as low as 7.3.  She received 2 units PRBCs with hemoglobin of 10.8 on day of discharge.   Recent admission 3/*** for painfless rectal bleeding supsected to be due to diverticular bleeding. Hgb one month before admission was 12.1, down to 9.8 on admission, 7.8 day of discharge. Treated conservatively. Plans for STAT CT angio GI bleed scan if recurrent bleeding.   EGD 05/2022:  -6cm hh with multiple Cameron ulcers -non-bleeding gastric ulcer with clean ulcer base -antral and oxyntic mucosa with mild reactive/reparative change. No H.plyori -increase PPI to BID -EGD in 6 months  Medications   Current Outpatient Medications  Medication Sig Dispense Refill   acetaminophen (TYLENOL) 650 MG CR tablet Take 650 mg by mouth every 8 (eight) hours as needed for pain (tylenol arthritis TID).     amLODipine-olmesartan (AZOR) 5-20 MG tablet Take 1 tablet by mouth daily.     cholecalciferol (VITAMIN D3) 25 MCG (1000 UNIT) tablet Take 1,000 Units by mouth daily.     ezetimibe (ZETIA) 10 MG tablet Take 10 mg by mouth daily.     ferrous sulfate 325 (65 FE) MG EC tablet Take 325 mg by mouth daily with breakfast.     hydroxychloroquine (PLAQUENIL) 200 MG tablet  Take 200 mg by mouth daily.     leflunomide (ARAVA) 10 MG tablet Take 10 mg by mouth daily.     Melatonin 10 MG TABS Take 10 mg by mouth at bedtime.     multivitamin-iron-minerals-folic acid (CENTRUM) chewable tablet Chew 1 tablet  by mouth daily.     pantoprazole (PROTONIX) 40 MG tablet Take 1 tablet (40 mg total) by mouth 2 (two) times daily. 180 tablet 3   sertraline (ZOLOFT) 25 MG tablet Take 25 mg by mouth daily.     solifenacin (VESICARE) 5 MG tablet Take 5 mg by mouth daily.     Vibegron (GEMTESA) 75 MG TABS Take 75 mg by mouth daily at 6 (six) AM.     No current facility-administered medications for this visit.    Allergies   Allergies as of 07/25/2022   (No Known Allergies)     Past Medical History   Past Medical History:  Diagnosis Date   Anemia    Arthritis    Gait disturbance    Rotator cuff tear    DR Ninfa Linden   Stroke [facility])    Vision abnormalities     Past Surgical History   Past Surgical History:  Procedure Laterality Date   BIOPSY  12/26/2021   Procedure: BIOPSY;  Surgeon: Harvel Quale, MD;  [facility];  Service: Gastroenterology;;   BIOPSY  01/04/2022   Procedure: BIOPSY;  Surgeon: Harvel Quale, MD;  [facility];  Service: Gastroenterology;;   BIOPSY  06/05/2022   Procedure: BIOPSY;  Surgeon: Harvel Quale, MD;  [facility];  Service: Gastroenterology;;   COLONOSCOPY WITH PROPOFOL N/A 12/26/2021   Procedure: COLONOSCOPY WITH PROPOFOL;  Surgeon: Harvel Quale, MD;  [facility];  Service: Gastroenterology;  Laterality: N/A;  645  ASA 3   ESOPHAGOGASTRODUODENOSCOPY (EGD) WITH PROPOFOL N/A 01/04/2022   Surgeon: Montez Morita, Quillian Quince, MD;  clotted blood at GE junction, esophageal ulcer with stigmata of recent bleeding, 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 Pathology with reactive gastropathy, negative for H. pylori.  Recommended repeating EGD in 3 months   ESOPHAGOGASTRODUODENOSCOPY (EGD) WITH PROPOFOL N/A 06/05/2022   Procedure: ESOPHAGOGASTRODUODENOSCOPY (EGD) WITH  PROPOFOL;  Surgeon: Harvel Quale, MD;  [facility];  Service: Gastroenterology;  Laterality: N/A;  1045am, asa 3, LM to see if pt can come earlier   ORIF CLAVICULAR FRACTURE Left     Past Family History   Family History  Problem Relation Age of Onset   Stroke Mother    Transient ischemic attack Mother    Stroke Father    Heart attack Father    Mental illness Brother    Breast cancer Daughter 79       [name]    Past Social History   Social History   Socioeconomic History   Marital status: Widowed    Spouse name: Not on file   Number of children: Not on file   Years of education: Not on file   Highest education level: Not on file  Occupational History   Not on file  Tobacco Use   Smoking status: Never    Passive exposure: Never   Smokeless tobacco: Never  Vaping Use   Vaping Use: Never used  Substance and Sexual Activity   Alcohol use: Yes    Comment: occasional wine  Drug use: Never   Sexual activity: Not on file  Other Topics Concern   Not on file  Social History Narrative   Not on file   Social Determinants of Health   Financial Resource Strain: Not on file  Food Insecurity: No Food Insecurity (07/04/2022)   Hunger Vital Sign    Worried About Running Out of Food in the Last Year: Never true    Ran Out of Food in the Last Year: Never true  Transportation Needs: No Transportation Needs (07/04/2022)   PRAPARE - Hydrologist (Medical): No    Lack of Transportation (Non-Medical): No  Physical Activity: Not on file  Stress: Not on file  Social Connections: Not on file  Intimate Partner Violence: Not At Risk (07/04/2022)   Humiliation, Afraid, Rape, and Kick questionnaire    Fear of Current or Ex-Partner: No    Emotionally Abused: No    Physically Abused: No    Sexually Abused: No    Review of Systems   General: Negative for anorexia, weight loss, fever, chills, fatigue, weakness. ENT: Negative for  hoarseness, difficulty swallowing , nasal congestion. CV: Negative for chest pain, angina, palpitations, dyspnea on exertion, peripheral edema.  Respiratory: Negative for dyspnea at rest, dyspnea on exertion, cough, sputum, wheezing.  GI: See history of present illness. GU:  Negative for dysuria, hematuria, urinary incontinence, urinary frequency, nocturnal urination.  Endo: Negative for unusual weight change.     Physical Exam   There were no vitals taken for this visit.   General: Well-nourished, well-developed in no acute distress.  Eyes: No icterus. Mouth: Oropharyngeal mucosa moist and pink , no lesions erythema or exudate. Lungs: Clear to auscultation bilaterally.  Heart: Regular rate and rhythm, no murmurs rubs or gallops.  Abdomen: Bowel sounds are normal, nontender, nondistended, no hepatosplenomegaly or masses,  no abdominal bruits or hernia , no rebound or guarding.  Rectal: ***  Extremities: No lower extremity edema. No clubbing or deformities. Neuro: Alert and oriented x 4   Skin: Warm and dry, no jaundice.   Psych: Alert and cooperative, normal mood and affect.  Labs   Lab Results  Component Value Date   CREATININE 0.80 07/05/2022   BUN 13 07/05/2022   NA 140 07/05/2022   K 3.4 (L) 07/05/2022   CL 106 07/05/2022   CO2 26 07/05/2022   Lab Results  Component Value Date   ALT 33 07/04/2022   AST 45 (H) 07/04/2022   ALKPHOS 66 07/04/2022   BILITOT 0.4 07/04/2022   Lab Results  Component Value Date   WBC 5.0 07/09/2022   HGB 9.0 (L) 07/09/2022   HCT 28.6 (L) 07/09/2022   MCV 96 07/09/2022   PLT 253 07/09/2022   Lab Results  Component Value Date   IRON 26 (L) 06/05/2022   TIBC 308 06/05/2022   FERRITIN 139 06/05/2022   No results found for: "VITAMINB12" No results found for: "FOLATE" Lab Results  Component Value Date   INR 1.0 07/04/2022    Imaging Studies   No results found.  Assessment       PLAN   ***   Laureen Ochs. Bobby Rumpf, [place],  [facility]

## 2022-07-26 ENCOUNTER — Ambulatory Visit (INDEPENDENT_AMBULATORY_CARE_PROVIDER_SITE_OTHER): Payer: Medicare PPO | Admitting: Gastroenterology

## 2022-07-26 ENCOUNTER — Encounter: Payer: Self-pay | Admitting: Gastroenterology

## 2022-07-26 VITALS — BP 145/83 | HR 82 | Temp 97.5°F | Ht 64.0 in | Wt 118.5 lb

## 2022-07-26 DIAGNOSIS — R634 Abnormal weight loss: Secondary | ICD-10-CM

## 2022-07-26 DIAGNOSIS — D509 Iron deficiency anemia, unspecified: Secondary | ICD-10-CM

## 2022-07-26 DIAGNOSIS — R195 Other fecal abnormalities: Secondary | ICD-10-CM

## 2022-07-26 NOTE — Patient Instructions (Addendum)
Continue boost juice drink shakes as needed.  Please ensure you are eating at least 3 meals per day, preferably 4-6 small meals per day to help maintain your weight.  For your looser stools you may continue to take Imodium as needed.  I suspect that if you start taking your iron once daily that this will help with your stool consistencies.  If you develop stools that are too hard you may start a stool softener along with your iron.  We will need to repeat your hemoglobin in about 4 weeks.  Continue to take your fiber supplement.  You may increase to twice daily if you are still having some looser stools after taking your iron daily for a few weeks.  Please start taking your iron every single day with food to avoid nausea.  Please let us know if you have any recurrent black/tarry stools or bright red blood in your stools.  We will reach out in July/August when it is time to repeat your upper endoscopy.  It was a pleasure to see you today. I want to create trusting relationships with patients. If you receive a survey regarding your visit,  I greatly appreciate you taking time to fill this out on paper or through your MyChart. I value your feedback.  Venetia Night, MSN, FNP-BC, AGACNP-BC [facility]

## 2022-07-26 NOTE — Progress Notes (Addendum)
Okay patient  GI Office Note    Referring Provider: Curlene Labrum, MD Primary Care Physician:  Curlene Labrum, MD Primary Gastroenterologist: Harvel Quale, MD  Date:  07/26/2022  ID:  Sarah, Page 12-03-1940, MRN [id number]   Chief Complaint   Chief Complaint  Patient presents with   Anemia    Follow up on anemia. Not seeing any blood in stools since last ER visit. Takes iron tablet every other day. Gets constipated if taking every day.    Diarrhea    Still having loose stools and cannot control when she has BM. Happens every morning.    Weight Loss    Follow up on weight loss. Has gained some weight. Last weight 107 lbs in sept and today 118. Forces self to eat.      History of Present Illness  Sarah Page is a 82 y.o. female with a history of anemia, arthritis, stroke, visual impairment, diverticulitis in 2019, chronic loose stools, possible IBS presenting today for follow-up with complaint of ongoing loose stools.  Her prior evaluation for diarrhea has included negative celiac panel, negative C. difficile and GI pathogen panel as well as O&P in July 2023 also with prior normal TSH.  Previously noted improvement with coconut and low FODMAP diet in August 2023.  Her typical dietary triggers include applesauce, garlic, cheese, diet Pepsi.  Colonoscopy 12/26/2021: -External hemorrhoids -Pancolonic diverticulosis -Normal colon s/p biopsy -Benign colon biopsies -Started on colestipol  Hospitalization 9/12-9/15/23.  She presented with nausea, vomiting, melena, coffee-ground emesis for 2 days.  She was also noted to have lost about 25 pounds.  She had normocytic anemia with hemoglobin 11.8 and FOBT positive.  She underwent CT A/P with contrast with acute diverticulitis/proctitis of the sigmoid and rectum, large hiatal hernia with mild thickening of the walls of the gastric antrum with gastric mucosal enhancement.  She was treated with antibiotics for  diverticulitis while inpatient but not given anything on discharge.  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 -Pathology with reactive gastropathy, negative for H. Pylori -Recommended iron daily and PPI twice daily and repeat EGD in 3 months.  Last office visit 01/11/2022.  Reportedly been feeling well since discharge.  Denying abdominal pain, nausea, vomiting, [place]ier, or melena.  Denied any reflux symptoms.  Would like taking pantoprazole 40 mg once daily per her discharge instructions although her bottle reported twice daily.  She had not resumed her Plavix since discharge.  Per Dr. Domenic Polite she was told she may not need to restart it.  Denies any NSAID use.  Reportedly eating okay and eating small portions but gets full quickly.  Was taking 2 boost per day.  She had not had any recurrent diarrhea in the last month with limiting food triggers including onions, garlic, dairy, apples, and coconut cookies.  She never started colestipol.  Using Imodium as needed.  She was advised to have hemoglobin rechecked in a week.  Start pantoprazole twice daily.  Encouraged to continue boost daily.  Continue to monitor weight.  EGD 06/05/2022: -6 cm hiatal hernia with multiple Cameron ulcers -Nonbleeding gastric ulcer with clean ulcer base s/p biopsy -Advised to increase pantoprazole to 40 mg twice daily -Pathology with mild reactive/reparative changes, negative for H. Pylori -She was advised to take  iron daily and if she were to have persistently low iron stores or drop in hemoglobin with persistent ulcers and she would need to consider hiatal hernia repair -Repeat EGD in 6 months for surveillance  Patient was set up for iron infusions at [facility].  She was to receive 5 IV  iron infusions.  Recent hospitalization 07/04/2022-07/05/2022.  She presented with painless rectal bleeding.  She had had an almost 1.5 point drop in her hemoglobin in 1 month.  Had a very large episode of BRBPR.  Suspected to be a diverticular bleed.  Unlikely to be rapid upper GI bleed.  Advised CTA GI bleed scan if reoccurred.  Advised PPI twice daily.  Advised bland diet for several days and then slowly add fiber back into her diet.  Advised 3-4 g daily.  CBC 07/09/2022 with hemoglobin of 9.  Today: Anemia- Has not had any rectal bleeding since  the hospital a few weeks a go. No melena. Overall right now she continues to have fatigue. [facility] has been riding the bike to keep herself mobile. She is used to doing what she wants to do but this has been a struggle for her. Taking iron every other day. She states she she was taking it every day she was getting backed up.   Diarrhea - When she wakes up she will use the bathroom and when she sits down to drink coffee she will meed to use the bathroom. She has more of a formed stool but she is unable to control it. Wears a pad to make sure she doesn't have accidents. If this happens twice in the day she will take some imodium. She feels as though this works better than the cholestyramine. No abdominal pain. Taking 2 fiber gummies daily. She reports that just about everything on the low FODMAP diet are things that she likes to eat and she was having trouble eating things that she liked to eat.   Weight - Still does not have much hunger. Does not get hungry until 9:30. Cooks every night. Cooks a lot of vegetables. Does eat chicken and beef. Has some buist juice drinks that are not milk based and she likes these. Does not drink them often but will drink mid afternoon if she feels hungry.   Reports she has some muscle pain in her legs and is due to see rheumatology soon. She wants to be able to walk more. She is taking 2 medications  for bladder issues.   Wt Readings from Last 3 Encounters:  07/26/22 118 lb 8 oz (53.8 kg)  07/04/22 112 lb 3.4 oz (50.9 kg)  06/05/22 107 lb 12.9 oz (48.9 kg)    Current Outpatient Medications  Medication Sig Dispense Refill   acetaminophen (TYLENOL) 650 MG CR tablet Take 650 mg by mouth every 8 (eight) hours as needed for pain (tylenol arthritis TID).     amLODipine-olmesartan (AZOR) 5-20 MG tablet Take 1 tablet by mouth daily.     cholecalciferol (VITAMIN D3) 25 MCG (1000 UNIT) tablet Take 1,000 Units by mouth daily.     ezetimibe (ZETIA) 10 MG tablet Take 10 mg by mouth daily.     ferrous sulfate 325 (65 FE) MG EC tablet Take 325 mg by mouth. Takes iron every other day     FIBER SELECT GUMMIES PO Take by mouth. 2 daily     hydroxychloroquine (PLAQUENIL) 200 MG tablet Take 200 mg by mouth daily.  leflunomide (ARAVA) 10 MG tablet Take 10 mg by mouth daily.     Melatonin 10 MG TABS Take 10 mg by mouth at bedtime.     multivitamin-iron-minerals-folic acid (CENTRUM) chewable tablet Chew 1 tablet by mouth daily.     pantoprazole (PROTONIX) 40 MG tablet Take 1 tablet (40 mg total) by mouth 2 (two) times daily. 180 tablet 3   sertraline (ZOLOFT) 25 MG tablet Take 25 mg by mouth daily.     solifenacin (VESICARE) 5 MG tablet Take 5 mg by mouth daily.     Vibegron (GEMTESA) 75 MG TABS Take 75 mg by mouth daily at 6 (six) AM.     No current facility-administered medications for this visit.    Past Medical History:  Diagnosis Date   Anemia    Arthritis    Gait disturbance    [facility]   [facility]    Vision abnormalities     Past Surgical History:  Procedure Laterality Date   BIOPSY  12/26/2021   Procedure: BIOPSY;  Surgeon: Dolores Frameastaneda Mayorga, Daniel, MD;  [facility];  Service: Gastroenterology;;   BIOPSY  01/04/2022   Procedure: BIOPSY;  Surgeon: Dolores Frameastaneda Mayorga, Daniel, MD;  [facility];  Service: Gastroenterology;;   BIOPSY   06/05/2022   Procedure: BIOPSY;  Surgeon: Dolores Frameastaneda Mayorga, Daniel, MD;  [facility];  Service: Gastroenterology;;   COLONOSCOPY WITH PROPOFOL N/A 12/26/2021   Procedure: COLONOSCOPY WITH PROPOFOL;  Surgeon: Dolores Frameastaneda Mayorga, Daniel, MD;  [facility];  Service: Gastroenterology;  Laterality: N/A;  645  ASA 3   ESOPHAGOGASTRODUODENOSCOPY (EGD) WITH PROPOFOL N/A 01/04/2022   Surgeon: Marguerita Merlesastaneda Mayorga, Reuel Boomaniel, MD;  clotted blood at GE junction, esophageal ulcer with stigmata of recent bleeding, 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 Pathology with reactive gastropathy, negative for H. pylori.  Recommended repeating EGD in 3 months   ESOPHAGOGASTRODUODENOSCOPY (EGD) WITH PROPOFOL N/A 06/05/2022   Procedure: ESOPHAGOGASTRODUODENOSCOPY (EGD) WITH PROPOFOL;  Surgeon: Dolores Frameastaneda Mayorga, Daniel, MD;  [facility];  Service: Gastroenterology;  Laterality: N/A;  1045am, asa 3, LM to see if pt can come earlier   ORIF CLAVICULAR FRACTURE Left     Family History  Problem Relation Age of Onset   Stroke Mother    Transient ischemic attack Mother    Stroke Father    Heart attack Father    Mental illness Brother    Breast cancer Daughter [phone]       [name]    Allergies as of 07/26/2022   (No Known Allergies)    Social History   Socioeconomic History   Marital status: Widowed    Spouse name: Not on file   Number of children: Not on file   Years of education: Not on file   Highest education level: Not on file  Occupational History   Not on file  Tobacco Use   Smoking status: Never    Passive exposure: Never   Smokeless tobacco: Never  Vaping Use   Vaping Use: Never used  Substance and Sexual Activity   Alcohol use: Yes    Comment: occasional wine   Drug use: Never   Sexual activity: Not on file  Other Topics Concern   Not on file   Social History Narrative   Not on file   Social Determinants of Health  Financial Resource Strain: Not on file  Food Insecurity: No Food Insecurity (07/04/2022)   Hunger Vital Sign    Worried About Running Out of Food in the Last Year: Never true    Ran Out of Food in the Last Year: Never true  Transportation Needs: No Transportation Needs (07/04/2022)   PRAPARE - Administrator, Civil Service (Medical): No    Lack of Transportation (Non-Medical): No  Physical Activity: Not on file  Stress: Not on file  Social Connections: Not on file     Review of Systems   Gen: Denies fever, chills, anorexia. Denies fatigue, weakness, weight loss.  CV: Denies chest pain, palpitations, syncope, peripheral edema, and claudication. Resp: Denies dyspnea at rest, cough, wheezing, coughing up blood, and pleurisy. GI: See HPI Derm: Denies rash, itching, dry skin Psych: Denies depression, anxiety, memory loss, confusion. No homicidal or suicidal ideation.  Heme: Denies bruising, bleeding, and enlarged lymph nodes.   Physical Exam   BP (!) 145/83 (BP Location: Right Arm)   Pulse 82   Temp (!) 97.5 F (36.4 C) (Oral)   Ht  (1.626 m)   Wt 118 lb 8 oz (53.8 kg)   BMI 20.34 kg/m   General:   Alert and oriented. No distress noted. Pleasant and cooperative.  Head:  Normocephalic and atraumatic. Eyes:  Conjuctiva clear without scleral icterus. Mouth:  Oral mucosa pink and moist. Good dentition. No lesions. Lungs:  Clear to auscultation bilaterally. No wheezes, rales, or rhonchi. No distress.  Heart:  S1, S2 present without murmurs appreciated.  Abdomen:  +BS, soft, non-tender and non-distended. No rebound or guarding. No HSM or masses noted. Rectal: deferred Msk:  Symmetrical without gross deformities. Normal posture. Extremities:  Without edema. Neurologic:  Alert and  oriented x4 Psych:  Alert and cooperative. Normal mood and affect.   Assessment  Sarah Page is a  82 y.o. female with a history of anemia, arthritis, stroke, visual impairment, diverticulitis in 2019, chronic loose stools, possible IBS, and recent suspected diverticular bleed presenting today for follow-up with complaint of ongoing loose stools.  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  Endoscopic workup outlined in HPI.  Her biopsies have been negative for H. pylori.  After her last EGD she has continued to take her pantoprazole twice daily as instructed.  She is currently only been taking her iron once every other day due to fear of constipation.  We discussed taking this every day to help with stool consistencies as well as to ensure she maintains her iron levels and hemoglobin levels.  She states that she will try this.  I instructed her that if she began to have some harder stools that she could take Colace once daily.  She had hemoglobin checked 07/09/2022 which was 4 days after discharge and she had improvement of her hemoglobin from 7.8-9.  Since her last office visit she has also received 5 IV iron infusions.  Given her hiatal hernia and multiple Sheria Lang erosions/ulcers this is likely the cause of her ongoing anemia.  She did also have a recent hospitalization for rectal bleeding which was suspected to be diverticular in nature.  She is on recall for repeat EGD in 6 months for surveillance of her ulcers.  Given she is having improvement in her hemoglobin we will recheck again in 4 weeks.  Abdominal exam benign today.  She was instructed to continue taking her pantoprazole 40 mg twice daily.  Diarrhea: No longer having  significant diarrhea.  Occasionally at times she is having some urgency with some looser/mushy stools when she drinks coffee in the mornings.  Has been trying to avoid some of her typical dietary triggers.  States she tried a low FODMAP diet but had difficulties in finding things that she like to eat that were not on the list.  She is currently using Imodium as needed.  We  discussed that if she has more bulk up stools that this may help her urgency/second bowel movement on the morning.  Currently she is taking 2 fiber Gummies daily.  We discussed possibly increasing this to twice daily if she does not have improvement of stool consistency after taking iron daily.  Weight loss: Previously with a significant amount of weight loss however since diarrhea has improved her weight has increased.  She has had imaging in the past with no explanation to explain her prior weight loss.  She still does not have much of an appetite but eats as she should.  She is using boost drinks as needed.  Per review of chart she has gained 11 pounds in less than 2 months.  Advised her to continue boost drinks as needed and continue what she is doing for calorie intake.  PLAN   Recheck CBC in 4 weeks.  Continue imodium as needed Start iron daily rather than every other day Pantoprazole 40 mg BID Continue fiber supplements daily. Can increase to twice daily if needed.  Continue boost drinks as needed Repeat EGD in August Follow-up in 3 months, sooner if needed.    Brooke Bonito, MSN, FNP-BC, AGACNP-BC [facility]  I have reviewed the note and agree with the APP's assessment as described in this progress note  Katrinka Blazing, MD Gastroenterology and Hepatology [facility]

## 2022-08-04 DIAGNOSIS — I10 Essential (primary) hypertension: Secondary | ICD-10-CM | POA: Diagnosis not present

## 2022-08-04 DIAGNOSIS — M159 Polyosteoarthritis, unspecified: Secondary | ICD-10-CM | POA: Diagnosis not present

## 2022-08-04 DIAGNOSIS — D62 Acute posthemorrhagic anemia: Secondary | ICD-10-CM | POA: Diagnosis not present

## 2022-08-04 DIAGNOSIS — K5791 Diverticulosis of intestine, part unspecified, without perforation or abscess with bleeding: Secondary | ICD-10-CM | POA: Diagnosis not present

## 2022-08-04 DIAGNOSIS — D509 Iron deficiency anemia, unspecified: Secondary | ICD-10-CM | POA: Diagnosis not present

## 2022-08-04 DIAGNOSIS — M069 Rheumatoid arthritis, unspecified: Secondary | ICD-10-CM | POA: Diagnosis not present

## 2022-08-04 DIAGNOSIS — N1831 Chronic kidney disease, stage 3a: Secondary | ICD-10-CM | POA: Diagnosis not present

## 2022-08-04 DIAGNOSIS — R5381 Other malaise: Secondary | ICD-10-CM | POA: Diagnosis not present

## 2022-08-04 DIAGNOSIS — U071 COVID-19: Secondary | ICD-10-CM | POA: Diagnosis not present

## 2022-08-07 ENCOUNTER — Ambulatory Visit (INDEPENDENT_AMBULATORY_CARE_PROVIDER_SITE_OTHER): Payer: Medicare PPO | Admitting: Gastroenterology

## 2022-08-08 DIAGNOSIS — R350 Frequency of micturition: Secondary | ICD-10-CM | POA: Diagnosis not present

## 2022-08-08 DIAGNOSIS — N3946 Mixed incontinence: Secondary | ICD-10-CM | POA: Diagnosis not present

## 2022-08-21 DIAGNOSIS — M069 Rheumatoid arthritis, unspecified: Secondary | ICD-10-CM | POA: Diagnosis not present

## 2022-08-22 ENCOUNTER — Other Ambulatory Visit: Payer: Self-pay | Admitting: *Deleted

## 2022-08-22 ENCOUNTER — Telehealth: Payer: Self-pay | Admitting: *Deleted

## 2022-08-22 DIAGNOSIS — D649 Anemia, unspecified: Secondary | ICD-10-CM

## 2022-08-22 DIAGNOSIS — D509 Iron deficiency anemia, unspecified: Secondary | ICD-10-CM

## 2022-08-22 NOTE — Telephone Encounter (Signed)
Patient in reminder file to have cbc repeated. I called pt to let her know it was time for labs. She told me she just [facility] yesterday [facility]. ( Labs are under [facility] in care every where when I check it. Hemoglobin was collected on 08/21/22 and it was 13.0)

## 2022-08-31 ENCOUNTER — Telehealth: Payer: Self-pay | Admitting: Gastroenterology

## 2022-08-31 DIAGNOSIS — S63615A Unspecified sprain of left ring finger, initial encounter: Secondary | ICD-10-CM | POA: Diagnosis not present

## 2022-08-31 DIAGNOSIS — R030 Elevated blood-pressure reading, without diagnosis of hypertension: Secondary | ICD-10-CM | POA: Diagnosis not present

## 2022-08-31 DIAGNOSIS — M25551 Pain in right hip: Secondary | ICD-10-CM | POA: Diagnosis not present

## 2022-08-31 DIAGNOSIS — Z6822 Body mass index (BMI) 22.0-22.9, adult: Secondary | ICD-10-CM | POA: Diagnosis not present

## 2022-08-31 NOTE — Telephone Encounter (Signed)
I received the results of the most recent labs from 08/21/2022 which showed a hemoglobin of 13.0, MCV 96, WBC 4.5, platelets 253.  CMP showed a AST of 46, ALT of 27, alkaline phosphatase 83, total bili 0.5, normal electrolytes, creatinine 0.89 and BUN 14.  Hi Crystal,  Can you please call the patient and tell the patient the CBC and CMP were normal?  Thanks,  Katrinka Blazing, MD Gastroenterology and Hepatology [facility]

## 2022-09-03 NOTE — Telephone Encounter (Signed)
Patient returned call on vm and said she would be home this morning to call back. I called her back and had to leave another message for her to return call

## 2022-09-03 NOTE — Telephone Encounter (Signed)
Discussed with patient per Dr. Levon Hedger -  I received the results of the most recent labs from 08/21/2022 which showed a hemoglobin of 13.0, MCV 96, WBC 4.5, platelets 253.  CMP showed a AST of 46, ALT of 27, alkaline phosphatase 83, total bili 0.5, normal electrolytes, creatinine 0.89 and BUN 14   the CBC and CMP were normal.    Patient verbalized understanding.

## 2022-09-03 NOTE — Telephone Encounter (Signed)
I called and left a message asked that patient please return call to the office.  

## 2022-09-18 LAB — CBC: EGFR: 66

## 2022-09-19 ENCOUNTER — Telehealth (INDEPENDENT_AMBULATORY_CARE_PROVIDER_SITE_OTHER): Payer: Self-pay

## 2022-10-03 DIAGNOSIS — R609 Edema, unspecified: Secondary | ICD-10-CM | POA: Diagnosis not present

## 2022-10-03 DIAGNOSIS — Z6822 Body mass index (BMI) 22.0-22.9, adult: Secondary | ICD-10-CM | POA: Diagnosis not present

## 2022-10-03 DIAGNOSIS — R030 Elevated blood-pressure reading, without diagnosis of hypertension: Secondary | ICD-10-CM | POA: Diagnosis not present

## 2022-10-11 DIAGNOSIS — I10 Essential (primary) hypertension: Secondary | ICD-10-CM | POA: Diagnosis not present

## 2022-10-11 DIAGNOSIS — R7303 Prediabetes: Secondary | ICD-10-CM | POA: Diagnosis not present

## 2022-10-11 DIAGNOSIS — K769 Liver disease, unspecified: Secondary | ICD-10-CM | POA: Diagnosis not present

## 2022-10-11 DIAGNOSIS — E7849 Other hyperlipidemia: Secondary | ICD-10-CM | POA: Diagnosis not present

## 2022-10-18 DIAGNOSIS — M159 Polyosteoarthritis, unspecified: Secondary | ICD-10-CM | POA: Diagnosis not present

## 2022-10-18 DIAGNOSIS — Z6822 Body mass index (BMI) 22.0-22.9, adult: Secondary | ICD-10-CM | POA: Diagnosis not present

## 2022-10-18 DIAGNOSIS — I10 Essential (primary) hypertension: Secondary | ICD-10-CM | POA: Diagnosis not present

## 2022-10-18 DIAGNOSIS — E785 Hyperlipidemia, unspecified: Secondary | ICD-10-CM | POA: Diagnosis not present

## 2022-10-18 DIAGNOSIS — E1169 Type 2 diabetes mellitus with other specified complication: Secondary | ICD-10-CM | POA: Diagnosis not present

## 2022-10-18 DIAGNOSIS — N1831 Chronic kidney disease, stage 3a: Secondary | ICD-10-CM | POA: Diagnosis not present

## 2022-10-18 DIAGNOSIS — M069 Rheumatoid arthritis, unspecified: Secondary | ICD-10-CM | POA: Diagnosis not present

## 2022-10-18 DIAGNOSIS — D509 Iron deficiency anemia, unspecified: Secondary | ICD-10-CM | POA: Diagnosis not present

## 2022-10-18 DIAGNOSIS — R2681 Unsteadiness on feet: Secondary | ICD-10-CM | POA: Diagnosis not present

## 2022-11-06 DIAGNOSIS — M1991 Primary osteoarthritis, unspecified site: Secondary | ICD-10-CM | POA: Diagnosis not present

## 2022-11-06 DIAGNOSIS — Z681 Body mass index (BMI) 19 or less, adult: Secondary | ICD-10-CM | POA: Diagnosis not present

## 2022-11-06 DIAGNOSIS — M0609 Rheumatoid arthritis without rheumatoid factor, multiple sites: Secondary | ICD-10-CM | POA: Diagnosis not present

## 2022-11-06 DIAGNOSIS — R29818 Other symptoms and signs involving the nervous system: Secondary | ICD-10-CM | POA: Diagnosis not present

## 2022-11-06 DIAGNOSIS — Z79899 Other long term (current) drug therapy: Secondary | ICD-10-CM | POA: Diagnosis not present

## 2022-11-06 DIAGNOSIS — R531 Weakness: Secondary | ICD-10-CM | POA: Diagnosis not present

## 2022-11-07 ENCOUNTER — Other Ambulatory Visit: Payer: Self-pay

## 2022-11-16 ENCOUNTER — Encounter (INDEPENDENT_AMBULATORY_CARE_PROVIDER_SITE_OTHER): Payer: Self-pay | Admitting: *Deleted

## 2022-11-19 ENCOUNTER — Ambulatory Visit (INDEPENDENT_AMBULATORY_CARE_PROVIDER_SITE_OTHER): Payer: Medicare PPO | Admitting: Gastroenterology

## 2022-11-26 DIAGNOSIS — R262 Difficulty in walking, not elsewhere classified: Secondary | ICD-10-CM | POA: Diagnosis not present

## 2022-11-26 DIAGNOSIS — Z789 Other specified health status: Secondary | ICD-10-CM | POA: Diagnosis not present

## 2022-11-26 DIAGNOSIS — R531 Weakness: Secondary | ICD-10-CM | POA: Diagnosis not present

## 2022-11-26 DIAGNOSIS — R293 Abnormal posture: Secondary | ICD-10-CM | POA: Diagnosis not present

## 2022-11-26 DIAGNOSIS — R2689 Other abnormalities of gait and mobility: Secondary | ICD-10-CM | POA: Diagnosis not present

## 2022-11-26 DIAGNOSIS — Z9181 History of falling: Secondary | ICD-10-CM | POA: Diagnosis not present

## 2022-12-05 DIAGNOSIS — Z9181 History of falling: Secondary | ICD-10-CM | POA: Diagnosis not present

## 2022-12-05 DIAGNOSIS — Z789 Other specified health status: Secondary | ICD-10-CM | POA: Diagnosis not present

## 2022-12-05 DIAGNOSIS — R262 Difficulty in walking, not elsewhere classified: Secondary | ICD-10-CM | POA: Diagnosis not present

## 2022-12-05 DIAGNOSIS — R293 Abnormal posture: Secondary | ICD-10-CM | POA: Diagnosis not present

## 2022-12-05 DIAGNOSIS — R531 Weakness: Secondary | ICD-10-CM | POA: Diagnosis not present

## 2022-12-05 DIAGNOSIS — R2689 Other abnormalities of gait and mobility: Secondary | ICD-10-CM | POA: Diagnosis not present

## 2022-12-06 DIAGNOSIS — H1031 Unspecified acute conjunctivitis, right eye: Secondary | ICD-10-CM | POA: Diagnosis not present

## 2022-12-07 DIAGNOSIS — H1031 Unspecified acute conjunctivitis, right eye: Secondary | ICD-10-CM | POA: Diagnosis not present

## 2022-12-10 DIAGNOSIS — R293 Abnormal posture: Secondary | ICD-10-CM | POA: Diagnosis not present

## 2022-12-10 DIAGNOSIS — Z9181 History of falling: Secondary | ICD-10-CM | POA: Diagnosis not present

## 2022-12-10 DIAGNOSIS — R2689 Other abnormalities of gait and mobility: Secondary | ICD-10-CM | POA: Diagnosis not present

## 2022-12-10 DIAGNOSIS — R531 Weakness: Secondary | ICD-10-CM | POA: Diagnosis not present

## 2022-12-10 DIAGNOSIS — R262 Difficulty in walking, not elsewhere classified: Secondary | ICD-10-CM | POA: Diagnosis not present

## 2022-12-10 DIAGNOSIS — Z789 Other specified health status: Secondary | ICD-10-CM | POA: Diagnosis not present

## 2022-12-12 DIAGNOSIS — Z789 Other specified health status: Secondary | ICD-10-CM | POA: Diagnosis not present

## 2022-12-12 DIAGNOSIS — R262 Difficulty in walking, not elsewhere classified: Secondary | ICD-10-CM | POA: Diagnosis not present

## 2022-12-12 DIAGNOSIS — Z9181 History of falling: Secondary | ICD-10-CM | POA: Diagnosis not present

## 2022-12-12 DIAGNOSIS — R531 Weakness: Secondary | ICD-10-CM | POA: Diagnosis not present

## 2022-12-12 DIAGNOSIS — R2689 Other abnormalities of gait and mobility: Secondary | ICD-10-CM | POA: Diagnosis not present

## 2022-12-12 DIAGNOSIS — R293 Abnormal posture: Secondary | ICD-10-CM | POA: Diagnosis not present

## 2022-12-14 ENCOUNTER — Telehealth (INDEPENDENT_AMBULATORY_CARE_PROVIDER_SITE_OTHER): Payer: Self-pay | Admitting: *Deleted

## 2022-12-14 NOTE — Telephone Encounter (Signed)
Referring MD/PCP: [employer]: Francine Graven [id number]  Best Phone Number: [phone]  Reason for the EGD gastric ulcer - 6 month EGD recall  Has patient had this procedure before?  Yes, 05/2022  If so, when, by whom and where?    Is there a family history of colon cancer?    Who?  What age when diagnosed?    Is patient diabetic? If yes, Type 1 or Type 2   no      Does patient have prosthetic heart valve or mechanical valve?  no  Do you have a pacemaker/defibrillator?  no  Has patient ever had endocarditis/atrial fibrillation? no  Has patient had joint replacement within last 12 months?  no  Is patient constipated or do they take laxatives? nono  Does patient have a history of alcohol/drug use?  no  Does patient use oxygen? no  Have you had a stroke/heart attack last 6 mths? no  Do you take medicine for weight loss?  no  For female patients,: have you had a hysterectomy no                      are you post menopausal                       do you still have your menstrual cycle   Do you take any blood-thinning medications such as: (aspirin, warfarin, Plavix, Aggrenox)  no  If yes we need the name, milligram, dosage and who is prescribing doctor   Medications: amlodipine 10 mg daily, olmesartan 20 mg daily, ezetimibe 10 mg daily, gemtesa 75 mg daily, plaquenil 200 mg daily, pantoprazole 40 mg twice daily, sertraine 25 mg daily, solifenacin 5 mg daily, leflunomide 10 mg daily  Allergies: nkda  Pharmacy: [facility]

## 2022-12-14 NOTE — Telephone Encounter (Signed)
Room 1 Thanks 

## 2022-12-17 DIAGNOSIS — Z9181 History of falling: Secondary | ICD-10-CM | POA: Diagnosis not present

## 2022-12-17 DIAGNOSIS — R293 Abnormal posture: Secondary | ICD-10-CM | POA: Diagnosis not present

## 2022-12-17 DIAGNOSIS — R2689 Other abnormalities of gait and mobility: Secondary | ICD-10-CM | POA: Diagnosis not present

## 2022-12-17 DIAGNOSIS — R531 Weakness: Secondary | ICD-10-CM | POA: Diagnosis not present

## 2022-12-17 DIAGNOSIS — R262 Difficulty in walking, not elsewhere classified: Secondary | ICD-10-CM | POA: Diagnosis not present

## 2022-12-17 DIAGNOSIS — Z789 Other specified health status: Secondary | ICD-10-CM | POA: Diagnosis not present

## 2022-12-20 DIAGNOSIS — R531 Weakness: Secondary | ICD-10-CM | POA: Diagnosis not present

## 2022-12-20 DIAGNOSIS — Z9181 History of falling: Secondary | ICD-10-CM | POA: Diagnosis not present

## 2022-12-20 DIAGNOSIS — R293 Abnormal posture: Secondary | ICD-10-CM | POA: Diagnosis not present

## 2022-12-20 DIAGNOSIS — Z789 Other specified health status: Secondary | ICD-10-CM | POA: Diagnosis not present

## 2022-12-20 DIAGNOSIS — R262 Difficulty in walking, not elsewhere classified: Secondary | ICD-10-CM | POA: Diagnosis not present

## 2022-12-20 DIAGNOSIS — R2689 Other abnormalities of gait and mobility: Secondary | ICD-10-CM | POA: Diagnosis not present

## 2022-12-25 ENCOUNTER — Ambulatory Visit (INDEPENDENT_AMBULATORY_CARE_PROVIDER_SITE_OTHER): Payer: Medicare PPO | Admitting: Gastroenterology

## 2022-12-27 NOTE — Telephone Encounter (Signed)
PA approved via cohere. DOS:  12/27/2022 - 01/26/2023, auth#: [id number]

## 2022-12-27 NOTE — Telephone Encounter (Signed)
Questionnaire from recall, no referral needed  

## 2022-12-27 NOTE — Telephone Encounter (Signed)
Spoke with pt. Scheduled for 9/11. Aware will send instructions via mychart

## 2022-12-31 DIAGNOSIS — R262 Difficulty in walking, not elsewhere classified: Secondary | ICD-10-CM | POA: Diagnosis not present

## 2022-12-31 DIAGNOSIS — R293 Abnormal posture: Secondary | ICD-10-CM | POA: Diagnosis not present

## 2022-12-31 DIAGNOSIS — Z789 Other specified health status: Secondary | ICD-10-CM | POA: Diagnosis not present

## 2022-12-31 DIAGNOSIS — R2689 Other abnormalities of gait and mobility: Secondary | ICD-10-CM | POA: Diagnosis not present

## 2022-12-31 DIAGNOSIS — R531 Weakness: Secondary | ICD-10-CM | POA: Diagnosis not present

## 2022-12-31 DIAGNOSIS — Z9181 History of falling: Secondary | ICD-10-CM | POA: Diagnosis not present

## 2023-01-01 ENCOUNTER — Other Ambulatory Visit (INDEPENDENT_AMBULATORY_CARE_PROVIDER_SITE_OTHER): Payer: Self-pay | Admitting: *Deleted

## 2023-01-02 ENCOUNTER — Ambulatory Visit (HOSPITAL_COMMUNITY): Payer: Medicare PPO | Admitting: Anesthesiology

## 2023-01-02 ENCOUNTER — Encounter (HOSPITAL_COMMUNITY): Payer: Self-pay | Admitting: Gastroenterology

## 2023-01-02 ENCOUNTER — Other Ambulatory Visit: Payer: Self-pay

## 2023-01-02 ENCOUNTER — Ambulatory Visit (HOSPITAL_COMMUNITY)
Admission: RE | Admit: 2023-01-02 | Discharge: 2023-01-02 | Disposition: A | Discharge status: Home / Self Care | Payer: Medicare PPO | Attending: Gastroenterology | Admitting: Gastroenterology

## 2023-01-02 ENCOUNTER — Encounter (HOSPITAL_COMMUNITY)
Admission: RE | Disposition: A | Discharge status: Home / Self Care | Payer: Self-pay | Source: Home / Self Care | Attending: Gastroenterology

## 2023-01-02 DIAGNOSIS — D649 Anemia, unspecified: Secondary | ICD-10-CM | POA: Insufficient documentation

## 2023-01-02 DIAGNOSIS — Z09 Encounter for follow-up examination after completed treatment for conditions other than malignant neoplasm: Secondary | ICD-10-CM | POA: Diagnosis not present

## 2023-01-02 DIAGNOSIS — K449 Diaphragmatic hernia without obstruction or gangrene: Secondary | ICD-10-CM | POA: Diagnosis not present

## 2023-01-02 DIAGNOSIS — M1990 Unspecified osteoarthritis, unspecified site: Secondary | ICD-10-CM | POA: Diagnosis not present

## 2023-01-02 DIAGNOSIS — F1090 Alcohol use, unspecified, uncomplicated: Secondary | ICD-10-CM | POA: Diagnosis not present

## 2023-01-02 DIAGNOSIS — Z8673 Personal history of transient ischemic attack (TIA), and cerebral infarction without residual deficits: Secondary | ICD-10-CM | POA: Diagnosis not present

## 2023-01-02 DIAGNOSIS — K279 Peptic ulcer, site unspecified, unspecified as acute or chronic, without hemorrhage or perforation: Secondary | ICD-10-CM

## 2023-01-02 DIAGNOSIS — K589 Irritable bowel syndrome without diarrhea: Secondary | ICD-10-CM | POA: Diagnosis not present

## 2023-01-02 DIAGNOSIS — Z8711 Personal history of peptic ulcer disease: Secondary | ICD-10-CM | POA: Diagnosis not present

## 2023-01-02 DIAGNOSIS — K259 Gastric ulcer, unspecified as acute or chronic, without hemorrhage or perforation: Secondary | ICD-10-CM | POA: Diagnosis not present

## 2023-01-02 HISTORY — PX: ESOPHAGOGASTRODUODENOSCOPY (EGD) WITH PROPOFOL: SHX5813

## 2023-01-02 LAB — IRON AND TIBC
Iron: 61 ug/dL (ref 28–170)
Saturation Ratios: 22 % (ref 10.4–31.8)
TIBC: 282 ug/dL (ref 250–450)
UIBC: 221 ug/dL

## 2023-01-02 LAB — CBC
HCT: 36.4 % (ref 36.0–46.0)
Hemoglobin: 11.6 g/dL — ABNORMAL LOW (ref 12.0–15.0)
MCH: 31.1 pg (ref 26.0–34.0)
MCHC: 31.9 g/dL (ref 30.0–36.0)
MCV: 97.6 fL (ref 80.0–100.0)
Platelets: 173 10*3/uL (ref 150–400)
RBC: 3.73 MIL/uL — ABNORMAL LOW (ref 3.87–5.11)
RDW: 12.2 % (ref 11.5–15.5)
WBC: 3.8 10*3/uL — ABNORMAL LOW (ref 4.0–10.5)
nRBC: 0 % (ref 0.0–0.2)

## 2023-01-02 LAB — FERRITIN: Ferritin: 139 ng/mL (ref 11–307)

## 2023-01-02 SURGERY — ESOPHAGOGASTRODUODENOSCOPY (EGD) WITH PROPOFOL
Anesthesia: General

## 2023-01-02 MED ORDER — LACTATED RINGERS IV SOLN: INTRAVENOUS | Status: DC

## 2023-01-02 MED ORDER — PROPOFOL 10 MG/ML IV BOLUS
INTRAVENOUS | Status: DC | PRN
Start: 2023-01-02 — End: 2023-01-02
  Administered 2023-01-02: 50 mg via INTRAVENOUS

## 2023-01-02 NOTE — H&P (Signed)
Sarah Page is an 82 y.o. female.   Chief Complaint: Follow-up HPI: Sarah Page is a 82 y.o. female with a history of anemia, arthritis, stroke, visual impairment, diverticulitis in 2019, chronic loose stools, IBS and Cameron ulcers.  Patient reports feeling well. The patient denies having any nausea, vomiting, fever, chills, hematochezia, melena, hematemesis, abdominal distention, abdominal pain, diarrhea, jaundice, pruritus or weight loss.  Most recent hemoglobin was 9.0.   Past Medical History:  Diagnosis Date   Anemia    Arthritis    Gait disturbance    Rotator cuff tear    [facility]   Stroke [facility])    Vision abnormalities     Past Surgical History:  Procedure Laterality Date   BIOPSY  12/26/2021   Procedure: BIOPSY;  Surgeon: Dolores Frame, MD;  [facility];  Service: Gastroenterology;;   BIOPSY  01/04/2022   Procedure: BIOPSY;  Surgeon: Dolores Frame, MD;  [facility];  Service: Gastroenterology;;   BIOPSY  06/05/2022   Procedure: BIOPSY;  Surgeon: Dolores Frame, MD;  [facility];  Service: Gastroenterology;;   COLONOSCOPY WITH PROPOFOL N/A 12/26/2021   Procedure: COLONOSCOPY WITH PROPOFOL;  Surgeon: Dolores Frame, MD;  [facility];  Service: Gastroenterology;  Laterality: N/A;  645  ASA 3   ESOPHAGOGASTRODUODENOSCOPY (EGD) WITH PROPOFOL N/A 01/04/2022   Surgeon: Marguerita Merles, Reuel Boom, MD;  clotted blood at GE junction, esophageal ulcer with stigmata of recent bleeding, 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 Pathology with reactive gastropathy, negative for H. pylori.  Recommended repeating EGD in 3 months   ESOPHAGOGASTRODUODENOSCOPY (EGD) WITH PROPOFOL N/A 06/05/2022   Procedure: ESOPHAGOGASTRODUODENOSCOPY (EGD) WITH PROPOFOL;  Surgeon: Dolores Frame, MD;  [facility];  Service: Gastroenterology;  Laterality: N/A;  1045am, asa 3, LM to see if pt can come earlier   ORIF CLAVICULAR FRACTURE Left     Family History  Problem Relation Age of Onset   Stroke Mother    Transient ischemic attack Mother    Stroke Father    Heart attack Father    Mental illness Brother    Breast cancer Daughter 27       [name]   Social History:  reports that she has never smoked. She has never been exposed to tobacco smoke. She has never used smokeless tobacco. She reports current alcohol use. She reports that she does not use drugs.  Allergies: No Known Allergies  Medications Prior to Admission  Medication Sig Dispense Refill   acetaminophen (TYLENOL) 650 MG CR tablet Take 650 mg by mouth every 8 (eight) hours as needed for pain (tylenol arthritis TID).     amLODipine-olmesartan (AZOR) 5-20 MG tablet Take 1 tablet by mouth daily.     ezetimibe (ZETIA) 10 MG tablet Take 10 mg by mouth daily.     ferrous sulfate 325 (65 FE) MG EC tablet Take 325 mg by mouth daily.     hydroxychloroquine (PLAQUENIL) 200 MG tablet Take 200 mg by mouth daily.     leflunomide (ARAVA) 10 MG tablet Take 10 mg by mouth daily.     Melatonin 10 MG TABS Take 10 mg by mouth at bedtime.     pantoprazole (PROTONIX) 40 MG tablet Take 1 tablet (40 mg total) by mouth 2 (two) times daily. 180 tablet 3   sertraline (  ZOLOFT) 25 MG tablet Take 25 mg by mouth daily.     solifenacin (VESICARE) 5 MG tablet Take 5 mg by mouth daily.     Vibegron (GEMTESA) 75 MG TABS Take 75 mg by mouth daily at 6 (six) AM.     cholecalciferol (VITAMIN D3) 25 MCG (1000 UNIT) tablet Take 1,000 Units by mouth daily.     FIBER SELECT GUMMIES PO Take by mouth. 2 daily     multivitamin-iron-minerals-folic acid (CENTRUM) chewable tablet Chew 1 tablet by mouth daily.      No results found for this or any previous visit (from the past 48 hour(s)). No results found.  Review of Systems  All  other systems reviewed and are negative.   Blood pressure (!) 174/75, pulse 62, temperature 98.1 F (36.7 C), temperature source Oral, resp. rate 17, height 5\' 4"  (1.626 m), weight 54.4 kg, SpO2 96%. Physical Exam  GENERAL: The patient is AO x3, in no acute distress. HEENT: Head is normocephalic and atraumatic. EOMI are intact. Mouth is well hydrated and without lesions. NECK: Supple. No masses LUNGS: Clear to auscultation. No presence of rhonchi/wheezing/rales. Adequate chest expansion HEART: RRR, normal s1 and s2. ABDOMEN: Soft, nontender, no guarding, no peritoneal signs, and nondistended. BS +. No masses. EXTREMITIES: Without any cyanosis, clubbing, rash, lesions or edema. NEUROLOGIC: AOx3, no focal motor deficit. SKIN: no jaundice, no rashes  Assessment/Plan Sarah Page is a 82 y.o. female with a history of anemia, arthritis, stroke, visual impairment, diverticulitis in 2019, chronic loose stools, IBS and Cameron ulcers.  Will proceed with EGD.  Dolores Frame, MD 01/02/2023, 8:44 AM

## 2023-01-02 NOTE — Transfer of Care (Signed)
Immediate Anesthesia Transfer of Care Note  Patient: Sarah Page  Procedure(s) Performed: ESOPHAGOGASTRODUODENOSCOPY (EGD) WITH PROPOFOL  Patient Location: Short Stay  Anesthesia Type:General  Level of Consciousness: awake, alert , oriented, and patient cooperative  Airway & Oxygen Therapy: Patient Spontanous Breathing  Post-op Assessment: Report given to RN, Post -op Vital signs reviewed and stable, and Patient moving all extremities X 4  Post vital signs: Reviewed and stable  Last Vitals:  Vitals Value Taken Time  BP 104/70   Temp    Pulse 72   Resp 20   SpO2 95     Last Pain:  Vitals:   01/02/23 0909  TempSrc:   PainSc: 0-No pain      Patients Stated Pain Goal: 5 (01/02/23 0756)  Complications: No notable events documented.

## 2023-01-02 NOTE — Op Note (Signed)
[facility] Patient Name: Sarah Page Procedure Date: 01/02/2023 9:03 AM MRN: [id number] Date of Birth: 04-24-1940 Attending MD: Katrinka Blazing , , [id number] CSN: [id number] Age: 82 Admit Type: Outpatient Procedure:                Upper GI endoscopy Indications:              Follow-up of peptic/Cameron ulcers Providers:                Katrinka Blazing, Jannett Celestine, RN, Zena Amos Referring MD:              Medicines:                Monitored Anesthesia Care Complications:            No immediate complications. Estimated Blood Loss:     Estimated blood loss: none. Procedure:                Pre-Anesthesia Assessment:                           - Prior to the procedure, a History and Physical                            was performed, and patient medications, allergies                            and sensitivities were reviewed. The patient's                            tolerance of previous anesthesia was reviewed.                           - The risks and benefits of the procedure and the                            sedation options and risks were discussed with the                            patient. All questions were answered and informed                            consent was obtained.                           - ASA Grade Assessment: III - A patient with severe                            systemic disease.                           After obtaining informed consent, the endoscope was                            passed under direct vision. Throughout the                            procedure, the patient's  blood pressure, pulse, and                            oxygen saturations were monitored continuously. The                            [id number]-H190 ([id number]) scope was introduced through the                            mouth, and advanced to the second part of duodenum.                            The upper GI endoscopy was accomplished without                             difficulty. The patient tolerated the procedure                            well. 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 There was presence of moderate       amount of food in the hernia. No ulcers were found.      The stomach was normal, although the pylorus was somewhat deformed due       to hernia.      The examined duodenum was normal. Impression:               - Large paraesophageal hernia.                           - Normal stomach.                           - Normal examined duodenum.                           - No specimens collected. Moderate Sedation:      Per Anesthesia Care Recommendation:           - Discharge patient to home (ambulatory).                           - Resume previous diet.                           - Continue present medications.                           - Check CBC and iron stores today.                           - If presenting abdominal pain or dysphagia, will                            need to have surgical consultation for hernia  repair.                           - If presenting severe abdominal pain or inability                            to swallow, will need urgent evaluation in the ER                            at [facility]. Procedure Code(s):        --- Professional ---                           [id number], Esophagogastroduodenoscopy, flexible,                            transoral; diagnostic, including collection of                            specimen(s) by brushing or washing, when performed                            (separate procedure) Diagnosis Code(s):        --- Professional ---                           K44.9, Diaphragmatic hernia without obstruction or                            gangrene                           K27.9, Peptic ulcer, site unspecified, unspecified                             as acute or chronic, without hemorrhage or                            perforation CPT copyright 2022 [employer]. All rights reserved. The codes documented in this report are preliminary and upon coder review may  be revised to meet current compliance requirements. Katrinka Blazing, MD Katrinka Blazing,  01/02/2023 9:27:25 AM This report has been signed electronically. Number of Addenda: 0

## 2023-01-02 NOTE — Discharge Instructions (Addendum)
You are being discharged to home.  Resume your previous diet.  Continue your present medications.  If presenting severe abdominal pain or inability to swallow, will need urgent evaluation in the ER at [facility].

## 2023-01-02 NOTE — Anesthesia Preprocedure Evaluation (Signed)
Anesthesia Evaluation  Patient identified by MRN, date of birth, ID band Patient awake    Reviewed: Allergy & Precautions, H&P , NPO status , Patient's Chart, lab work & pertinent test results, reviewed documented beta blocker date and time   Airway Mallampati: II  TM Distance: >3 FB Neck ROM: full    Dental no notable dental hx.    Pulmonary neg pulmonary ROS   Pulmonary exam normal breath sounds clear to auscultation       Cardiovascular Exercise Tolerance: Good hypertension, negative cardio ROS  Rhythm:regular Rate:Normal     Neuro/Psych  Neuromuscular disease CVA negative neurological ROS  negative psych ROS   GI/Hepatic negative GI ROS, Neg liver ROS, hiatal hernia, PUD,,,  Endo/Other  negative endocrine ROS    Renal/GU Renal diseasenegative Renal ROS  negative genitourinary   Musculoskeletal   Abdominal   Peds  Hematology negative hematology ROS (+) Blood dyscrasia, anemia   Anesthesia Other Findings   Reproductive/Obstetrics negative OB ROS                             Anesthesia Physical Anesthesia Plan  ASA: 2  Anesthesia Plan: General   Post-op Pain Management:    Induction:   PONV Risk Score and Plan: Propofol infusion  Airway Management Planned:   Additional Equipment:   Intra-op Plan:   Post-operative Plan:   Informed Consent: I have reviewed the patients History and Physical, chart, labs and discussed the procedure including the risks, benefits and alternatives for the proposed anesthesia with the patient or authorized representative who has indicated his/her understanding and acceptance.     Dental Advisory Given  Plan Discussed with: CRNA  Anesthesia Plan Comments:        Anesthesia Quick Evaluation

## 2023-01-03 DIAGNOSIS — R293 Abnormal posture: Secondary | ICD-10-CM | POA: Diagnosis not present

## 2023-01-03 DIAGNOSIS — Z9181 History of falling: Secondary | ICD-10-CM | POA: Diagnosis not present

## 2023-01-03 DIAGNOSIS — R531 Weakness: Secondary | ICD-10-CM | POA: Diagnosis not present

## 2023-01-03 DIAGNOSIS — R262 Difficulty in walking, not elsewhere classified: Secondary | ICD-10-CM | POA: Diagnosis not present

## 2023-01-03 DIAGNOSIS — R2689 Other abnormalities of gait and mobility: Secondary | ICD-10-CM | POA: Diagnosis not present

## 2023-01-03 DIAGNOSIS — Z789 Other specified health status: Secondary | ICD-10-CM | POA: Diagnosis not present

## 2023-01-06 NOTE — Anesthesia Postprocedure Evaluation (Signed)
Anesthesia Post Note  Patient: Sarah Page  Procedure(s) Performed: ESOPHAGOGASTRODUODENOSCOPY (EGD) WITH PROPOFOL  Patient location during evaluation: Phase II Anesthesia Type: General Level of consciousness: awake Pain management: pain level controlled Vital Signs Assessment: post-procedure vital signs reviewed and stable Respiratory status: spontaneous breathing and respiratory function stable Cardiovascular status: blood pressure returned to baseline and stable Postop Assessment: no headache and no apparent nausea or vomiting Anesthetic complications: no Comments: Late entry   No notable events documented.   Last Vitals:  Vitals:   01/02/23 0920 01/02/23 0923  BP: (!) 102/55 120/72  Pulse: 60 62  Resp: (!) 22 14  Temp:    SpO2:  96%    Last Pain:  Vitals:   01/02/23 0923  TempSrc:   PainSc: 0-No pain                 Windell Norfolk

## 2023-01-09 DIAGNOSIS — N1831 Chronic kidney disease, stage 3a: Secondary | ICD-10-CM | POA: Diagnosis not present

## 2023-01-09 DIAGNOSIS — I10 Essential (primary) hypertension: Secondary | ICD-10-CM | POA: Diagnosis not present

## 2023-01-09 DIAGNOSIS — R7303 Prediabetes: Secondary | ICD-10-CM | POA: Diagnosis not present

## 2023-01-09 DIAGNOSIS — R5383 Other fatigue: Secondary | ICD-10-CM | POA: Diagnosis not present

## 2023-01-09 DIAGNOSIS — R262 Difficulty in walking, not elsewhere classified: Secondary | ICD-10-CM | POA: Diagnosis not present

## 2023-01-09 DIAGNOSIS — Z9181 History of falling: Secondary | ICD-10-CM | POA: Diagnosis not present

## 2023-01-09 DIAGNOSIS — Z789 Other specified health status: Secondary | ICD-10-CM | POA: Diagnosis not present

## 2023-01-09 DIAGNOSIS — R739 Hyperglycemia, unspecified: Secondary | ICD-10-CM | POA: Diagnosis not present

## 2023-01-09 DIAGNOSIS — R531 Weakness: Secondary | ICD-10-CM | POA: Diagnosis not present

## 2023-01-09 DIAGNOSIS — R293 Abnormal posture: Secondary | ICD-10-CM | POA: Diagnosis not present

## 2023-01-09 DIAGNOSIS — R2689 Other abnormalities of gait and mobility: Secondary | ICD-10-CM | POA: Diagnosis not present

## 2023-01-11 ENCOUNTER — Encounter (HOSPITAL_COMMUNITY): Payer: Self-pay | Admitting: Gastroenterology

## 2023-01-14 DIAGNOSIS — R293 Abnormal posture: Secondary | ICD-10-CM | POA: Diagnosis not present

## 2023-01-14 DIAGNOSIS — R531 Weakness: Secondary | ICD-10-CM | POA: Diagnosis not present

## 2023-01-14 DIAGNOSIS — Z789 Other specified health status: Secondary | ICD-10-CM | POA: Diagnosis not present

## 2023-01-14 DIAGNOSIS — R2689 Other abnormalities of gait and mobility: Secondary | ICD-10-CM | POA: Diagnosis not present

## 2023-01-14 DIAGNOSIS — R262 Difficulty in walking, not elsewhere classified: Secondary | ICD-10-CM | POA: Diagnosis not present

## 2023-01-14 DIAGNOSIS — Z9181 History of falling: Secondary | ICD-10-CM | POA: Diagnosis not present

## 2023-01-16 DIAGNOSIS — I10 Essential (primary) hypertension: Secondary | ICD-10-CM | POA: Diagnosis not present

## 2023-01-16 DIAGNOSIS — Z6822 Body mass index (BMI) 22.0-22.9, adult: Secondary | ICD-10-CM | POA: Diagnosis not present

## 2023-01-16 DIAGNOSIS — N1831 Chronic kidney disease, stage 3a: Secondary | ICD-10-CM | POA: Diagnosis not present

## 2023-01-16 DIAGNOSIS — E1169 Type 2 diabetes mellitus with other specified complication: Secondary | ICD-10-CM | POA: Diagnosis not present

## 2023-01-16 DIAGNOSIS — M0600 Rheumatoid arthritis without rheumatoid factor, unspecified site: Secondary | ICD-10-CM | POA: Diagnosis not present

## 2023-01-17 DIAGNOSIS — R2689 Other abnormalities of gait and mobility: Secondary | ICD-10-CM | POA: Diagnosis not present

## 2023-01-17 DIAGNOSIS — Z789 Other specified health status: Secondary | ICD-10-CM | POA: Diagnosis not present

## 2023-01-17 DIAGNOSIS — R531 Weakness: Secondary | ICD-10-CM | POA: Diagnosis not present

## 2023-01-17 DIAGNOSIS — Z9181 History of falling: Secondary | ICD-10-CM | POA: Diagnosis not present

## 2023-01-17 DIAGNOSIS — R262 Difficulty in walking, not elsewhere classified: Secondary | ICD-10-CM | POA: Diagnosis not present

## 2023-01-17 DIAGNOSIS — R293 Abnormal posture: Secondary | ICD-10-CM | POA: Diagnosis not present

## 2023-01-21 DIAGNOSIS — Z9181 History of falling: Secondary | ICD-10-CM | POA: Diagnosis not present

## 2023-01-21 DIAGNOSIS — R2689 Other abnormalities of gait and mobility: Secondary | ICD-10-CM | POA: Diagnosis not present

## 2023-01-21 DIAGNOSIS — Z789 Other specified health status: Secondary | ICD-10-CM | POA: Diagnosis not present

## 2023-01-21 DIAGNOSIS — R293 Abnormal posture: Secondary | ICD-10-CM | POA: Diagnosis not present

## 2023-01-21 DIAGNOSIS — R262 Difficulty in walking, not elsewhere classified: Secondary | ICD-10-CM | POA: Diagnosis not present

## 2023-01-21 DIAGNOSIS — R531 Weakness: Secondary | ICD-10-CM | POA: Diagnosis not present

## 2023-01-24 DIAGNOSIS — R531 Weakness: Secondary | ICD-10-CM | POA: Diagnosis not present

## 2023-01-24 DIAGNOSIS — R2689 Other abnormalities of gait and mobility: Secondary | ICD-10-CM | POA: Diagnosis not present

## 2023-01-24 DIAGNOSIS — R293 Abnormal posture: Secondary | ICD-10-CM | POA: Diagnosis not present

## 2023-01-24 DIAGNOSIS — Z789 Other specified health status: Secondary | ICD-10-CM | POA: Diagnosis not present

## 2023-01-24 DIAGNOSIS — R262 Difficulty in walking, not elsewhere classified: Secondary | ICD-10-CM | POA: Diagnosis not present

## 2023-01-24 DIAGNOSIS — Z9181 History of falling: Secondary | ICD-10-CM | POA: Diagnosis not present

## 2023-01-28 ENCOUNTER — Ambulatory Visit (INDEPENDENT_AMBULATORY_CARE_PROVIDER_SITE_OTHER): Payer: Medicare PPO | Admitting: Gastroenterology

## 2023-01-28 ENCOUNTER — Encounter (INDEPENDENT_AMBULATORY_CARE_PROVIDER_SITE_OTHER): Payer: Self-pay | Admitting: Gastroenterology

## 2023-01-28 VITALS — BP 128/77 | HR 98 | Temp 97.5°F | Ht 64.0 in | Wt 119.1 lb

## 2023-01-28 DIAGNOSIS — K259 Gastric ulcer, unspecified as acute or chronic, without hemorrhage or perforation: Secondary | ICD-10-CM

## 2023-01-28 DIAGNOSIS — D500 Iron deficiency anemia secondary to blood loss (chronic): Secondary | ICD-10-CM

## 2023-01-28 DIAGNOSIS — D509 Iron deficiency anemia, unspecified: Secondary | ICD-10-CM | POA: Diagnosis not present

## 2023-01-28 DIAGNOSIS — K449 Diaphragmatic hernia without obstruction or gangrene: Secondary | ICD-10-CM

## 2023-01-28 NOTE — Progress Notes (Signed)
Sarah Page, M.D. Gastroenterology & Hepatology [facility] [address] [place], [place] [address]  Primary Care Physician: Juliette Alcide, MD [address] [place] [address]  I will communicate my assessment and recommendations to the referring MD via EMR.  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 female with a history of anemia, arthritis, stroke, visual impairment, diverticulitis in 2019, chronic loose stools, IBS and Cameron ulcers, who presents for follow up of iron deficiency anemia.  The patient was last seen on 07/26/2022. At that time, the patient was advised to repeat an EGD, which was scheduled in September 2024 with fine described below.  She was continued on pantoprazole 40 mg twice daily and iron supplementation.  Patient denies any complaints. She reports taking her medications compliantly, including her pantoprazole 40 mg BID. Also takes ferrous sulfate qday.  The patient denies having any nausea, vomiting, fever, chills, hematochezia, melena, hematemesis, abdominal distention, abdominal pain, diarrhea, jaundice, pruritus or weight loss.  Most recent lab from 01/02/2023 showed a hemoglobin of 11.6, MCV 97, platelets 173 and WBC 3.8. Last iron stores showed iron 61 saturation 22%, TIBC 282 and ferritin 139.  Last EGD: 01/02/2023 - Large paraesophageal hernia. - Normal stomach. - Normal examined duodenum.  Last Colonoscopy: 12/26/2021: -External hemorrhoids -Pancolonic diverticulosis -Normal colon s/p biopsy -Benign colon biopsies  Past Medical History: Past Medical History:  Diagnosis Date   Anemia    Arthritis    Gait disturbance    Rotator cuff tear    DR Magnus Ivan   Stroke [facility])    Vision abnormalities     Past Surgical History: Past Surgical History:  Procedure Laterality Date   BIOPSY  12/26/2021   Procedure: BIOPSY;  Surgeon:  Dolores Frame, MD;  [facility];  Service: Gastroenterology;;   BIOPSY  01/04/2022   Procedure: BIOPSY;  Surgeon: Dolores Frame, MD;  [facility];  Service: Gastroenterology;;   BIOPSY  06/05/2022   Procedure: BIOPSY;  Surgeon: Dolores Frame, MD;  [facility];  Service: Gastroenterology;;   COLONOSCOPY WITH PROPOFOL N/A 12/26/2021   Procedure: COLONOSCOPY WITH PROPOFOL;  Surgeon: Dolores Frame, MD;  [facility];  Service: Gastroenterology;  Laterality: N/A;  645  ASA 3   ESOPHAGOGASTRODUODENOSCOPY (EGD) WITH PROPOFOL N/A 01/04/2022   Surgeon: Marguerita Merles, Reuel Boom, MD;  clotted blood at GE junction, esophageal ulcer with stigmata of recent bleeding, 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 Pathology with reactive gastropathy, negative for H. pylori.  Recommended repeating EGD in 3 months   ESOPHAGOGASTRODUODENOSCOPY (EGD) WITH PROPOFOL N/A 06/05/2022   Procedure: ESOPHAGOGASTRODUODENOSCOPY (EGD) WITH PROPOFOL;  Surgeon: Dolores Frame, MD;  [facility];  Service: Gastroenterology;  Laterality: N/A;  1045am, asa 3, LM to see if pt can come earlier   ESOPHAGOGASTRODUODENOSCOPY (EGD) WITH PROPOFOL N/A 01/02/2023   Procedure: ESOPHAGOGASTRODUODENOSCOPY (EGD) WITH PROPOFOL;  Surgeon: Dolores Frame, MD;  [facility];  Service: Gastroenterology;  Laterality: N/A;  915am, asa 2   ORIF CLAVICULAR FRACTURE Left     Family History: Family History  Problem Relation Age of Onset   Stroke Mother    Transient ischemic attack Mother    Stroke Father    Heart attack Father  Mental illness Brother    Breast cancer Daughter 42       Sarah Page    Social History: Social History   Tobacco Use  Smoking Status Never   Passive exposure: Never   Smokeless Tobacco Never   Social History   Substance and Sexual Activity  Alcohol Use Yes   Comment: occasional wine   Social History   Substance and Sexual Activity  Drug Use Never    Allergies: No Known Allergies  Medications: Current Outpatient Medications  Medication Sig Dispense Refill   acetaminophen (TYLENOL) 650 MG CR tablet Take 650 mg by mouth every 8 (eight) hours as needed for pain (tylenol arthritis TID).     amLODipine-olmesartan (AZOR) 5-20 MG tablet Take 1 tablet by mouth daily.     ezetimibe (ZETIA) 10 MG tablet Take 10 mg by mouth daily.     ferrous sulfate 325 (65 FE) MG EC tablet Take 325 mg by mouth daily.     hydroxychloroquine (PLAQUENIL) 200 MG tablet Take 200 mg by mouth daily.     leflunomide (ARAVA) 10 MG tablet Take 10 mg by mouth daily.     loperamide (IMODIUM A-D) 2 MG tablet Take 2 mg by mouth as needed for diarrhea or loose stools.     Melatonin 10 MG TABS Take 10 mg by mouth at bedtime.     pantoprazole (PROTONIX) 40 MG tablet Take 1 tablet (40 mg total) by mouth 2 (two) times daily. 180 tablet 3   sertraline (ZOLOFT) 25 MG tablet Take 25 mg by mouth daily.     solifenacin (VESICARE) 5 MG tablet Take 5 mg by mouth daily.     Vibegron (GEMTESA) 75 MG TABS Take 75 mg by mouth daily at 6 (six) AM.     No current facility-administered medications for this visit.    Review of Systems: GENERAL: negative for malaise, night sweats HEENT: No changes in hearing or vision, no nose bleeds or other nasal problems. NECK: Negative for lumps, goiter, pain and significant neck swelling RESPIRATORY: Negative for cough, wheezing CARDIOVASCULAR: Negative for chest pain, leg swelling, palpitations, orthopnea GI: SEE HPI MUSCULOSKELETAL: Negative for joint pain or swelling, back pain, and muscle pain. SKIN: Negative for lesions, rash PSYCH: Negative for sleep disturbance, mood disorder and recent psychosocial stressors. HEMATOLOGY Negative for prolonged  bleeding, bruising easily, and swollen nodes. ENDOCRINE: Negative for cold or heat intolerance, polyuria, polydipsia and goiter. NEURO: negative for tremor, gait imbalance, syncope and seizures. The remainder of the review of systems is noncontributory.   Physical Exam: BP 128/77 (BP Location: Left Arm, Patient Position: Sitting, Cuff Size: Normal)   Pulse 98   Temp (!) 97.5 F (36.4 C) (Temporal)   Ht 5\' 4"  (1.626 m)   Wt 119 lb 1.6 oz (54 kg)   BMI 20.44 kg/m  GENERAL: The patient is AO x3, in no acute distress. HEENT: Head is normocephalic and atraumatic. EOMI are intact. Mouth is well hydrated and without lesions. NECK: Supple. No masses LUNGS: Clear to auscultation. No presence of rhonchi/wheezing/rales. Adequate chest expansion HEART: RRR, normal s1 and s2. ABDOMEN: Soft, nontender, no guarding, no peritoneal signs, and nondistended. BS +. No masses. EXTREMITIES: Without any cyanosis, clubbing, rash, lesions or edema. NEUROLOGIC: AOx3, no focal motor deficit. SKIN: no jaundice, no rashes  Imaging/Labs: as above  I personally reviewed and interpreted the available labs, imaging and endoscopic files.  Impression and Plan: Sarah Page is a 82 y.o. female with a history of anemia,  arthritis, stroke, visual impairment, diverticulitis in 2019, chronic loose stools, IBS and Cameron ulcers, who presents for follow up of iron deficiency anemia.  The patient had progressive anemia in the past, which was considered to be secondary to [facility] ulcers.  Fortunately, this Healed endoscopically on most recent EGD with high-dose PPI regimen.  Also, her iron stores have much more improved with the use of oral iron supplementation.  Given her age and high risk of recurrent anemia, we will continue with pantoprazole at same dose for now, but may consider decreasing dosage in follow-up appointment.  For now, she will also need to continue with her current dosage of iron  supplementation.  -Continue pantoprazole 40 mg every 12 hours, will consider decreasing to 40/20 mg in next appointment -Continue with ferrous sulfate 325 mg qday  All questions were answered.      Sarah Blazing, MD Gastroenterology and Hepatology [facility]

## 2023-01-28 NOTE — Patient Instructions (Signed)
Continue pantoprazole 40 mg every 12 hours, will consider decreasing to 40/20 mg in next appointment Continue with ferrous sulfate 325 mg qday

## 2023-01-29 DIAGNOSIS — R2689 Other abnormalities of gait and mobility: Secondary | ICD-10-CM | POA: Diagnosis not present

## 2023-01-29 DIAGNOSIS — R262 Difficulty in walking, not elsewhere classified: Secondary | ICD-10-CM | POA: Diagnosis not present

## 2023-01-29 DIAGNOSIS — Z9181 History of falling: Secondary | ICD-10-CM | POA: Diagnosis not present

## 2023-01-29 DIAGNOSIS — R531 Weakness: Secondary | ICD-10-CM | POA: Diagnosis not present

## 2023-01-29 DIAGNOSIS — R293 Abnormal posture: Secondary | ICD-10-CM | POA: Diagnosis not present

## 2023-01-29 DIAGNOSIS — Z789 Other specified health status: Secondary | ICD-10-CM | POA: Diagnosis not present

## 2023-01-31 DIAGNOSIS — R531 Weakness: Secondary | ICD-10-CM | POA: Diagnosis not present

## 2023-01-31 DIAGNOSIS — Z789 Other specified health status: Secondary | ICD-10-CM | POA: Diagnosis not present

## 2023-01-31 DIAGNOSIS — R262 Difficulty in walking, not elsewhere classified: Secondary | ICD-10-CM | POA: Diagnosis not present

## 2023-01-31 DIAGNOSIS — Z9181 History of falling: Secondary | ICD-10-CM | POA: Diagnosis not present

## 2023-01-31 DIAGNOSIS — R2689 Other abnormalities of gait and mobility: Secondary | ICD-10-CM | POA: Diagnosis not present

## 2023-01-31 DIAGNOSIS — R293 Abnormal posture: Secondary | ICD-10-CM | POA: Diagnosis not present

## 2023-02-05 DIAGNOSIS — Z9181 History of falling: Secondary | ICD-10-CM | POA: Diagnosis not present

## 2023-02-05 DIAGNOSIS — Z789 Other specified health status: Secondary | ICD-10-CM | POA: Diagnosis not present

## 2023-02-05 DIAGNOSIS — R531 Weakness: Secondary | ICD-10-CM | POA: Diagnosis not present

## 2023-02-05 DIAGNOSIS — R2689 Other abnormalities of gait and mobility: Secondary | ICD-10-CM | POA: Diagnosis not present

## 2023-02-05 DIAGNOSIS — R293 Abnormal posture: Secondary | ICD-10-CM | POA: Diagnosis not present

## 2023-02-05 DIAGNOSIS — R262 Difficulty in walking, not elsewhere classified: Secondary | ICD-10-CM | POA: Diagnosis not present

## 2023-02-07 DIAGNOSIS — Z789 Other specified health status: Secondary | ICD-10-CM | POA: Diagnosis not present

## 2023-02-07 DIAGNOSIS — R293 Abnormal posture: Secondary | ICD-10-CM | POA: Diagnosis not present

## 2023-02-07 DIAGNOSIS — R262 Difficulty in walking, not elsewhere classified: Secondary | ICD-10-CM | POA: Diagnosis not present

## 2023-02-07 DIAGNOSIS — R2689 Other abnormalities of gait and mobility: Secondary | ICD-10-CM | POA: Diagnosis not present

## 2023-02-07 DIAGNOSIS — R531 Weakness: Secondary | ICD-10-CM | POA: Diagnosis not present

## 2023-02-07 DIAGNOSIS — Z9181 History of falling: Secondary | ICD-10-CM | POA: Diagnosis not present

## 2023-02-11 DIAGNOSIS — Z79899 Other long term (current) drug therapy: Secondary | ICD-10-CM | POA: Diagnosis not present

## 2023-02-11 DIAGNOSIS — M0609 Rheumatoid arthritis without rheumatoid factor, multiple sites: Secondary | ICD-10-CM | POA: Diagnosis not present

## 2023-02-11 DIAGNOSIS — Z681 Body mass index (BMI) 19 or less, adult: Secondary | ICD-10-CM | POA: Diagnosis not present

## 2023-02-11 DIAGNOSIS — M1991 Primary osteoarthritis, unspecified site: Secondary | ICD-10-CM | POA: Diagnosis not present

## 2023-02-13 DIAGNOSIS — Z789 Other specified health status: Secondary | ICD-10-CM | POA: Diagnosis not present

## 2023-02-13 DIAGNOSIS — R2689 Other abnormalities of gait and mobility: Secondary | ICD-10-CM | POA: Diagnosis not present

## 2023-02-13 DIAGNOSIS — R262 Difficulty in walking, not elsewhere classified: Secondary | ICD-10-CM | POA: Diagnosis not present

## 2023-02-13 DIAGNOSIS — R531 Weakness: Secondary | ICD-10-CM | POA: Diagnosis not present

## 2023-02-13 DIAGNOSIS — R293 Abnormal posture: Secondary | ICD-10-CM | POA: Diagnosis not present

## 2023-02-13 DIAGNOSIS — Z9181 History of falling: Secondary | ICD-10-CM | POA: Diagnosis not present

## 2023-02-15 DIAGNOSIS — R531 Weakness: Secondary | ICD-10-CM | POA: Diagnosis not present

## 2023-02-15 DIAGNOSIS — R2689 Other abnormalities of gait and mobility: Secondary | ICD-10-CM | POA: Diagnosis not present

## 2023-02-15 DIAGNOSIS — R293 Abnormal posture: Secondary | ICD-10-CM | POA: Diagnosis not present

## 2023-02-15 DIAGNOSIS — Z9181 History of falling: Secondary | ICD-10-CM | POA: Diagnosis not present

## 2023-02-15 DIAGNOSIS — R262 Difficulty in walking, not elsewhere classified: Secondary | ICD-10-CM | POA: Diagnosis not present

## 2023-02-15 DIAGNOSIS — Z789 Other specified health status: Secondary | ICD-10-CM | POA: Diagnosis not present

## 2023-02-18 DIAGNOSIS — R2689 Other abnormalities of gait and mobility: Secondary | ICD-10-CM | POA: Diagnosis not present

## 2023-02-18 DIAGNOSIS — R262 Difficulty in walking, not elsewhere classified: Secondary | ICD-10-CM | POA: Diagnosis not present

## 2023-02-18 DIAGNOSIS — Z9181 History of falling: Secondary | ICD-10-CM | POA: Diagnosis not present

## 2023-02-18 DIAGNOSIS — R531 Weakness: Secondary | ICD-10-CM | POA: Diagnosis not present

## 2023-02-18 DIAGNOSIS — R293 Abnormal posture: Secondary | ICD-10-CM | POA: Diagnosis not present

## 2023-02-18 DIAGNOSIS — Z789 Other specified health status: Secondary | ICD-10-CM | POA: Diagnosis not present

## 2023-02-20 DIAGNOSIS — R531 Weakness: Secondary | ICD-10-CM | POA: Diagnosis not present

## 2023-02-20 DIAGNOSIS — R2689 Other abnormalities of gait and mobility: Secondary | ICD-10-CM | POA: Diagnosis not present

## 2023-02-20 DIAGNOSIS — Z789 Other specified health status: Secondary | ICD-10-CM | POA: Diagnosis not present

## 2023-02-20 DIAGNOSIS — Z9181 History of falling: Secondary | ICD-10-CM | POA: Diagnosis not present

## 2023-02-20 DIAGNOSIS — R262 Difficulty in walking, not elsewhere classified: Secondary | ICD-10-CM | POA: Diagnosis not present

## 2023-02-20 DIAGNOSIS — R293 Abnormal posture: Secondary | ICD-10-CM | POA: Diagnosis not present

## 2023-02-25 DIAGNOSIS — R293 Abnormal posture: Secondary | ICD-10-CM | POA: Diagnosis not present

## 2023-02-25 DIAGNOSIS — R262 Difficulty in walking, not elsewhere classified: Secondary | ICD-10-CM | POA: Diagnosis not present

## 2023-02-25 DIAGNOSIS — Z9181 History of falling: Secondary | ICD-10-CM | POA: Diagnosis not present

## 2023-02-25 DIAGNOSIS — R531 Weakness: Secondary | ICD-10-CM | POA: Diagnosis not present

## 2023-02-25 DIAGNOSIS — R2689 Other abnormalities of gait and mobility: Secondary | ICD-10-CM | POA: Diagnosis not present

## 2023-02-25 DIAGNOSIS — Z789 Other specified health status: Secondary | ICD-10-CM | POA: Diagnosis not present

## 2023-02-28 DIAGNOSIS — Z9181 History of falling: Secondary | ICD-10-CM | POA: Diagnosis not present

## 2023-02-28 DIAGNOSIS — Z789 Other specified health status: Secondary | ICD-10-CM | POA: Diagnosis not present

## 2023-02-28 DIAGNOSIS — R293 Abnormal posture: Secondary | ICD-10-CM | POA: Diagnosis not present

## 2023-02-28 DIAGNOSIS — R262 Difficulty in walking, not elsewhere classified: Secondary | ICD-10-CM | POA: Diagnosis not present

## 2023-02-28 DIAGNOSIS — R531 Weakness: Secondary | ICD-10-CM | POA: Diagnosis not present

## 2023-02-28 DIAGNOSIS — R2689 Other abnormalities of gait and mobility: Secondary | ICD-10-CM | POA: Diagnosis not present

## 2023-03-04 DIAGNOSIS — H2512 Age-related nuclear cataract, left eye: Secondary | ICD-10-CM | POA: Diagnosis not present

## 2023-03-04 DIAGNOSIS — H524 Presbyopia: Secondary | ICD-10-CM | POA: Diagnosis not present

## 2023-05-01 DIAGNOSIS — R262 Difficulty in walking, not elsewhere classified: Secondary | ICD-10-CM | POA: Diagnosis not present

## 2023-05-01 DIAGNOSIS — R531 Weakness: Secondary | ICD-10-CM | POA: Diagnosis not present

## 2023-05-01 DIAGNOSIS — R2689 Other abnormalities of gait and mobility: Secondary | ICD-10-CM | POA: Diagnosis not present

## 2023-05-01 DIAGNOSIS — R293 Abnormal posture: Secondary | ICD-10-CM | POA: Diagnosis not present

## 2023-05-01 DIAGNOSIS — Z789 Other specified health status: Secondary | ICD-10-CM | POA: Diagnosis not present

## 2023-05-01 DIAGNOSIS — Z9181 History of falling: Secondary | ICD-10-CM | POA: Diagnosis not present

## 2023-05-06 DIAGNOSIS — R531 Weakness: Secondary | ICD-10-CM | POA: Diagnosis not present

## 2023-05-06 DIAGNOSIS — R262 Difficulty in walking, not elsewhere classified: Secondary | ICD-10-CM | POA: Diagnosis not present

## 2023-05-06 DIAGNOSIS — Z9181 History of falling: Secondary | ICD-10-CM | POA: Diagnosis not present

## 2023-05-06 DIAGNOSIS — R2689 Other abnormalities of gait and mobility: Secondary | ICD-10-CM | POA: Diagnosis not present

## 2023-05-06 DIAGNOSIS — Z789 Other specified health status: Secondary | ICD-10-CM | POA: Diagnosis not present

## 2023-05-06 DIAGNOSIS — R293 Abnormal posture: Secondary | ICD-10-CM | POA: Diagnosis not present

## 2023-05-10 DIAGNOSIS — R293 Abnormal posture: Secondary | ICD-10-CM | POA: Diagnosis not present

## 2023-05-10 DIAGNOSIS — Z9181 History of falling: Secondary | ICD-10-CM | POA: Diagnosis not present

## 2023-05-10 DIAGNOSIS — Z789 Other specified health status: Secondary | ICD-10-CM | POA: Diagnosis not present

## 2023-05-10 DIAGNOSIS — R531 Weakness: Secondary | ICD-10-CM | POA: Diagnosis not present

## 2023-05-10 DIAGNOSIS — R2689 Other abnormalities of gait and mobility: Secondary | ICD-10-CM | POA: Diagnosis not present

## 2023-05-10 DIAGNOSIS — R262 Difficulty in walking, not elsewhere classified: Secondary | ICD-10-CM | POA: Diagnosis not present

## 2023-05-14 DIAGNOSIS — R262 Difficulty in walking, not elsewhere classified: Secondary | ICD-10-CM | POA: Diagnosis not present

## 2023-05-14 DIAGNOSIS — Z789 Other specified health status: Secondary | ICD-10-CM | POA: Diagnosis not present

## 2023-05-14 DIAGNOSIS — Z9181 History of falling: Secondary | ICD-10-CM | POA: Diagnosis not present

## 2023-05-14 DIAGNOSIS — R293 Abnormal posture: Secondary | ICD-10-CM | POA: Diagnosis not present

## 2023-05-14 DIAGNOSIS — R2689 Other abnormalities of gait and mobility: Secondary | ICD-10-CM | POA: Diagnosis not present

## 2023-05-14 DIAGNOSIS — R531 Weakness: Secondary | ICD-10-CM | POA: Diagnosis not present

## 2023-05-16 DIAGNOSIS — R531 Weakness: Secondary | ICD-10-CM | POA: Diagnosis not present

## 2023-05-16 DIAGNOSIS — R262 Difficulty in walking, not elsewhere classified: Secondary | ICD-10-CM | POA: Diagnosis not present

## 2023-05-16 DIAGNOSIS — Z789 Other specified health status: Secondary | ICD-10-CM | POA: Diagnosis not present

## 2023-05-16 DIAGNOSIS — R293 Abnormal posture: Secondary | ICD-10-CM | POA: Diagnosis not present

## 2023-05-16 DIAGNOSIS — R2689 Other abnormalities of gait and mobility: Secondary | ICD-10-CM | POA: Diagnosis not present

## 2023-05-16 DIAGNOSIS — Z9181 History of falling: Secondary | ICD-10-CM | POA: Diagnosis not present

## 2023-05-20 DIAGNOSIS — R293 Abnormal posture: Secondary | ICD-10-CM | POA: Diagnosis not present

## 2023-05-20 DIAGNOSIS — R2689 Other abnormalities of gait and mobility: Secondary | ICD-10-CM | POA: Diagnosis not present

## 2023-05-20 DIAGNOSIS — R262 Difficulty in walking, not elsewhere classified: Secondary | ICD-10-CM | POA: Diagnosis not present

## 2023-05-20 DIAGNOSIS — Z789 Other specified health status: Secondary | ICD-10-CM | POA: Diagnosis not present

## 2023-05-20 DIAGNOSIS — Z9181 History of falling: Secondary | ICD-10-CM | POA: Diagnosis not present

## 2023-05-20 DIAGNOSIS — R531 Weakness: Secondary | ICD-10-CM | POA: Diagnosis not present

## 2023-05-23 DIAGNOSIS — R293 Abnormal posture: Secondary | ICD-10-CM | POA: Diagnosis not present

## 2023-05-23 DIAGNOSIS — Z9181 History of falling: Secondary | ICD-10-CM | POA: Diagnosis not present

## 2023-05-23 DIAGNOSIS — Z789 Other specified health status: Secondary | ICD-10-CM | POA: Diagnosis not present

## 2023-05-23 DIAGNOSIS — R531 Weakness: Secondary | ICD-10-CM | POA: Diagnosis not present

## 2023-05-23 DIAGNOSIS — R2689 Other abnormalities of gait and mobility: Secondary | ICD-10-CM | POA: Diagnosis not present

## 2023-05-23 DIAGNOSIS — R262 Difficulty in walking, not elsewhere classified: Secondary | ICD-10-CM | POA: Diagnosis not present

## 2023-05-28 DIAGNOSIS — R531 Weakness: Secondary | ICD-10-CM | POA: Diagnosis not present

## 2023-05-28 DIAGNOSIS — R262 Difficulty in walking, not elsewhere classified: Secondary | ICD-10-CM | POA: Diagnosis not present

## 2023-05-28 DIAGNOSIS — Z9181 History of falling: Secondary | ICD-10-CM | POA: Diagnosis not present

## 2023-05-28 DIAGNOSIS — R2689 Other abnormalities of gait and mobility: Secondary | ICD-10-CM | POA: Diagnosis not present

## 2023-05-28 DIAGNOSIS — Z789 Other specified health status: Secondary | ICD-10-CM | POA: Diagnosis not present

## 2023-05-28 DIAGNOSIS — R293 Abnormal posture: Secondary | ICD-10-CM | POA: Diagnosis not present

## 2023-05-30 DIAGNOSIS — R293 Abnormal posture: Secondary | ICD-10-CM | POA: Diagnosis not present

## 2023-05-30 DIAGNOSIS — Z9181 History of falling: Secondary | ICD-10-CM | POA: Diagnosis not present

## 2023-05-30 DIAGNOSIS — R531 Weakness: Secondary | ICD-10-CM | POA: Diagnosis not present

## 2023-05-30 DIAGNOSIS — R262 Difficulty in walking, not elsewhere classified: Secondary | ICD-10-CM | POA: Diagnosis not present

## 2023-05-30 DIAGNOSIS — Z789 Other specified health status: Secondary | ICD-10-CM | POA: Diagnosis not present

## 2023-05-30 DIAGNOSIS — R2689 Other abnormalities of gait and mobility: Secondary | ICD-10-CM | POA: Diagnosis not present

## 2023-06-05 DIAGNOSIS — Z9181 History of falling: Secondary | ICD-10-CM | POA: Diagnosis not present

## 2023-06-05 DIAGNOSIS — Z789 Other specified health status: Secondary | ICD-10-CM | POA: Diagnosis not present

## 2023-06-05 DIAGNOSIS — R293 Abnormal posture: Secondary | ICD-10-CM | POA: Diagnosis not present

## 2023-06-05 DIAGNOSIS — R262 Difficulty in walking, not elsewhere classified: Secondary | ICD-10-CM | POA: Diagnosis not present

## 2023-06-05 DIAGNOSIS — R531 Weakness: Secondary | ICD-10-CM | POA: Diagnosis not present

## 2023-06-05 DIAGNOSIS — R2689 Other abnormalities of gait and mobility: Secondary | ICD-10-CM | POA: Diagnosis not present

## 2023-06-10 DIAGNOSIS — R262 Difficulty in walking, not elsewhere classified: Secondary | ICD-10-CM | POA: Diagnosis not present

## 2023-06-10 DIAGNOSIS — R293 Abnormal posture: Secondary | ICD-10-CM | POA: Diagnosis not present

## 2023-06-10 DIAGNOSIS — R531 Weakness: Secondary | ICD-10-CM | POA: Diagnosis not present

## 2023-06-10 DIAGNOSIS — Z9181 History of falling: Secondary | ICD-10-CM | POA: Diagnosis not present

## 2023-06-10 DIAGNOSIS — Z789 Other specified health status: Secondary | ICD-10-CM | POA: Diagnosis not present

## 2023-06-10 DIAGNOSIS — R2689 Other abnormalities of gait and mobility: Secondary | ICD-10-CM | POA: Diagnosis not present

## 2023-06-18 ENCOUNTER — Other Ambulatory Visit (INDEPENDENT_AMBULATORY_CARE_PROVIDER_SITE_OTHER): Payer: Self-pay | Admitting: Gastroenterology

## 2023-06-18 DIAGNOSIS — R262 Difficulty in walking, not elsewhere classified: Secondary | ICD-10-CM | POA: Diagnosis not present

## 2023-06-18 DIAGNOSIS — Z789 Other specified health status: Secondary | ICD-10-CM | POA: Diagnosis not present

## 2023-06-18 DIAGNOSIS — Z9181 History of falling: Secondary | ICD-10-CM | POA: Diagnosis not present

## 2023-06-18 DIAGNOSIS — R293 Abnormal posture: Secondary | ICD-10-CM | POA: Diagnosis not present

## 2023-06-18 DIAGNOSIS — R531 Weakness: Secondary | ICD-10-CM | POA: Diagnosis not present

## 2023-06-18 DIAGNOSIS — R2689 Other abnormalities of gait and mobility: Secondary | ICD-10-CM | POA: Diagnosis not present

## 2023-06-20 DIAGNOSIS — R2689 Other abnormalities of gait and mobility: Secondary | ICD-10-CM | POA: Diagnosis not present

## 2023-06-20 DIAGNOSIS — R262 Difficulty in walking, not elsewhere classified: Secondary | ICD-10-CM | POA: Diagnosis not present

## 2023-06-20 DIAGNOSIS — Z789 Other specified health status: Secondary | ICD-10-CM | POA: Diagnosis not present

## 2023-06-20 DIAGNOSIS — Z9181 History of falling: Secondary | ICD-10-CM | POA: Diagnosis not present

## 2023-06-20 DIAGNOSIS — R531 Weakness: Secondary | ICD-10-CM | POA: Diagnosis not present

## 2023-06-20 DIAGNOSIS — R293 Abnormal posture: Secondary | ICD-10-CM | POA: Diagnosis not present

## 2023-06-27 DIAGNOSIS — R2689 Other abnormalities of gait and mobility: Secondary | ICD-10-CM | POA: Diagnosis not present

## 2023-06-27 DIAGNOSIS — R262 Difficulty in walking, not elsewhere classified: Secondary | ICD-10-CM | POA: Diagnosis not present

## 2023-06-27 DIAGNOSIS — Z789 Other specified health status: Secondary | ICD-10-CM | POA: Diagnosis not present

## 2023-06-27 DIAGNOSIS — Z9181 History of falling: Secondary | ICD-10-CM | POA: Diagnosis not present

## 2023-06-27 DIAGNOSIS — R531 Weakness: Secondary | ICD-10-CM | POA: Diagnosis not present

## 2023-06-27 DIAGNOSIS — R293 Abnormal posture: Secondary | ICD-10-CM | POA: Diagnosis not present

## 2023-07-11 DIAGNOSIS — E785 Hyperlipidemia, unspecified: Secondary | ICD-10-CM | POA: Diagnosis not present

## 2023-07-11 DIAGNOSIS — E782 Mixed hyperlipidemia: Secondary | ICD-10-CM | POA: Diagnosis not present

## 2023-07-11 DIAGNOSIS — Z1329 Encounter for screening for other suspected endocrine disorder: Secondary | ICD-10-CM | POA: Diagnosis not present

## 2023-07-11 DIAGNOSIS — E1169 Type 2 diabetes mellitus with other specified complication: Secondary | ICD-10-CM | POA: Diagnosis not present

## 2023-07-11 DIAGNOSIS — Z0001 Encounter for general adult medical examination with abnormal findings: Secondary | ICD-10-CM | POA: Diagnosis not present

## 2023-07-11 DIAGNOSIS — R7303 Prediabetes: Secondary | ICD-10-CM | POA: Diagnosis not present

## 2023-07-11 DIAGNOSIS — K769 Liver disease, unspecified: Secondary | ICD-10-CM | POA: Diagnosis not present

## 2023-07-16 DIAGNOSIS — R29818 Other symptoms and signs involving the nervous system: Secondary | ICD-10-CM | POA: Diagnosis not present

## 2023-07-16 DIAGNOSIS — M0609 Rheumatoid arthritis without rheumatoid factor, multiple sites: Secondary | ICD-10-CM | POA: Diagnosis not present

## 2023-07-16 DIAGNOSIS — Z6820 Body mass index (BMI) 20.0-20.9, adult: Secondary | ICD-10-CM | POA: Diagnosis not present

## 2023-07-16 DIAGNOSIS — M1991 Primary osteoarthritis, unspecified site: Secondary | ICD-10-CM | POA: Diagnosis not present

## 2023-07-16 DIAGNOSIS — Z79899 Other long term (current) drug therapy: Secondary | ICD-10-CM | POA: Diagnosis not present

## 2023-07-17 DIAGNOSIS — I10 Essential (primary) hypertension: Secondary | ICD-10-CM | POA: Diagnosis not present

## 2023-07-17 DIAGNOSIS — N1831 Chronic kidney disease, stage 3a: Secondary | ICD-10-CM | POA: Diagnosis not present

## 2023-07-17 DIAGNOSIS — Z6823 Body mass index (BMI) 23.0-23.9, adult: Secondary | ICD-10-CM | POA: Diagnosis not present

## 2023-07-17 DIAGNOSIS — Z1331 Encounter for screening for depression: Secondary | ICD-10-CM | POA: Diagnosis not present

## 2023-07-17 DIAGNOSIS — M159 Polyosteoarthritis, unspecified: Secondary | ICD-10-CM | POA: Diagnosis not present

## 2023-07-17 DIAGNOSIS — D509 Iron deficiency anemia, unspecified: Secondary | ICD-10-CM | POA: Diagnosis not present

## 2023-07-17 DIAGNOSIS — Z0001 Encounter for general adult medical examination with abnormal findings: Secondary | ICD-10-CM | POA: Diagnosis not present

## 2023-07-17 DIAGNOSIS — Z1389 Encounter for screening for other disorder: Secondary | ICD-10-CM | POA: Diagnosis not present

## 2023-07-17 DIAGNOSIS — Z9189 Other specified personal risk factors, not elsewhere classified: Secondary | ICD-10-CM | POA: Diagnosis not present

## 2023-08-17 DIAGNOSIS — Z6823 Body mass index (BMI) 23.0-23.9, adult: Secondary | ICD-10-CM | POA: Diagnosis not present

## 2023-08-17 DIAGNOSIS — M79604 Pain in right leg: Secondary | ICD-10-CM | POA: Diagnosis not present

## 2023-08-17 DIAGNOSIS — M7061 Trochanteric bursitis, right hip: Secondary | ICD-10-CM | POA: Diagnosis not present

## 2023-09-04 ENCOUNTER — Encounter: Payer: Self-pay | Admitting: Gastroenterology

## 2023-09-04 ENCOUNTER — Ambulatory Visit (INDEPENDENT_AMBULATORY_CARE_PROVIDER_SITE_OTHER): Admitting: Gastroenterology

## 2023-09-04 VITALS — BP 153/82 | HR 75 | Temp 97.8°F | Ht 64.0 in | Wt 119.0 lb

## 2023-09-04 DIAGNOSIS — K862 Cyst of pancreas: Secondary | ICD-10-CM

## 2023-09-04 DIAGNOSIS — K625 Hemorrhage of anus and rectum: Secondary | ICD-10-CM | POA: Diagnosis not present

## 2023-09-04 MED ORDER — HYDROCORTISONE (PERIANAL) 2.5 % EX CREA: 1.0000 | TOPICAL_CREAM | Freq: Two times a day (BID) | CUTANEOUS | 0 refills | Status: AC

## 2023-09-04 NOTE — Progress Notes (Addendum)
 GI Office Note    Referring Provider: Alston Jerry, MD Primary Care Physician:  Alston Jerry, MD  Primary Gastroenterologist: Sammi Crick  Chief Complaint   Chief Complaint  Patient presents with   Rectal Bleeding    Started yesterday, seen brb in toilet.    History of Present Illness   Sarah Page is a 83 y.o. female presenting today as same day visit for rectal bleeding. 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 Last seen 01/2023.   Labs from July 17, 2023: Hemoglobin 12.3, hematocrit 38, MCV 95, platelets 219,000 creatinine 1.02, LFTs normal, CRP less than 1  Today: yesterday she went to the gym, using the exercise bike. When she got home, she had a BM, noted brbpr. No clots. Blood in the toilet. No melena. She had an episode 3-4 times, last time around 6am. She passed stool each time. Somewhat looser towards the end. She denies abdominal pain, rectal pain, weakness, lightheadedness, shortness of breath, chest pain. She states she is a little tired today from not resting well last night. Denies blood thinners or aspirin. No nsaids. No weight loss. No heartburn, dysphagia.    Wt Readings from Last 3 Encounters:  09/04/23 119 lb (54 kg)  01/28/23 119 lb 1.6 oz (54 kg)  01/02/23 120 lb (54.4 kg)     Last EGD: 01/02/2023 - Large paraesophageal hernia. - Normal stomach. - Normal examined duodenum.   Last Colonoscopy: 12/26/2021: -External hemorrhoids -Pancolonic diverticulosis -Normal colon s/p biopsy -Benign colon biopsies     Medications   Current Outpatient Medications  Medication Sig Dispense Refill   acetaminophen  (TYLENOL ) 650 MG CR tablet Take 650 mg by mouth every 8 (eight) hours as needed for pain (tylenol  arthritis TID).     amLODipine -olmesartan  (AZOR ) 5-20 MG tablet Take 1 tablet by mouth daily.     cholecalciferol  (VITAMIN D3) 25  MCG (1000 UNIT) tablet Take 1,000 Units by mouth daily.     ezetimibe  (ZETIA ) 10 MG tablet Take 10 mg by mouth daily.     ferrous sulfate  325 (65 FE) MG EC tablet Take 325 mg by mouth daily.     hydroxychloroquine  (PLAQUENIL ) 200 MG tablet Take 200 mg by mouth daily.     leflunomide  (ARAVA ) 10 MG tablet Take 10 mg by mouth daily.     loperamide (IMODIUM A-D) 2 MG tablet Take 2 mg by mouth as needed for diarrhea or loose stools.     Melatonin 10 MG TABS Take 10 mg by mouth at bedtime.     pantoprazole  (PROTONIX ) 40 MG tablet TAKE 1 TABLET BY MOUTH TWICE DAILY 180 tablet 3   sertraline  (ZOLOFT ) 25 MG tablet Take 25 mg by mouth daily.     solifenacin (VESICARE) 5 MG tablet Take 5 mg by mouth daily.     Vibegron (GEMTESA) 75 MG TABS Take 75 mg by mouth daily at 6 (six) AM.     No current facility-administered medications for this visit.    Allergies   Allergies as of 09/04/2023   (No Known Allergies)       Review of Systems   General: Negative for anorexia, weight loss, fever, chills, fatigue, weakness. ENT: Negative for hoarseness, difficulty swallowing , nasal congestion. CV: Negative for chest pain, angina, palpitations, dyspnea on exertion, peripheral edema.  Respiratory: Negative for dyspnea at rest, dyspnea on exertion, cough, sputum, wheezing.  GI: See history of present illness. GU:  Negative for dysuria, hematuria, urinary incontinence, urinary frequency, nocturnal urination.  Endo: Negative for unusual weight change.     Physical Exam   BP (!) 153/82 (BP Location: Right Arm, Patient Position: Sitting, Cuff Size: Normal)   Pulse 75   Temp 97.8 F (36.6 C) (Oral)   Ht 5\' 4"  (1.626 m)   Wt 119 lb (54 kg)   SpO2 100%   BMI 20.43 kg/m    General: Well-nourished, well-developed in no acute distress.  Eyes: No icterus. Mouth: Oropharyngeal mucosa moist and pink   Lungs: Clear to auscultation bilaterally.  Heart: Regular rate and rhythm, no murmurs rubs or gallops.   Abdomen: Bowel sounds are normal, nontender, nondistended, no hepatosplenomegaly or masses,  no abdominal bruits or hernia , no rebound or guarding.  Rectal: external hemorrhoids noted. Some prolapsing hemorrhoidal tissue that was erythematous/excoriated, appeared to have recently bled. Nontender rectal exam. No rectal mass. Green stool no gross blood noted. Extremities: No lower extremity edema. No clubbing or deformities. Neuro: Alert and oriented x 4   Skin: Warm and dry, no jaundice.   Psych: Alert and cooperative, normal mood and affect.  Labs      Lab Results  Component Value Date   WBC 3.8 (L) 01/02/2023   HGB 11.6 (L) 01/02/2023   HCT 36.4 01/02/2023   MCV 97.6 01/02/2023   PLT 173 01/02/2023   Lab Results  Component Value Date   IRON  61 01/02/2023   TIBC 282 01/02/2023   FERRITIN 139 01/02/2023   See recent labs in HPI  Imaging Studies   No results found.  Assessment/Plan:   Rectal bleeding: appears to be self limiting bleeding likely hemorrhoidal. Cannot exclude diverticular bleeding. Last episode of bleeding at 6am. She denies symptoms of anemia. Normal Hgb in March. I recommended checking Hgb today but she did not want to do it. She is agreeable to labs if she has recurrent bleeding.  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 Had been on MR in 2020 (had to view in [employer]/PACS). Consider repeat MR this year to complete five years of surveillance if patient agreeable. Will discuss at follow up ov.     Trudie Fuse. Harles Lied, MHS, PA-C [facility]  I have reviewed the note and agree with the APP's assessment as described in this progress note  Samantha Cress, MD Gastroenterology and Hepatology [facility]

## 2023-09-04 NOTE — Patient Instructions (Addendum)
 It appears that you have had a recent hemorrhoid bleed based on your exam today. I will send in hemorrhoid cream that you should apply to anorectal area, try to get some cream up into anal opening as well. Use twice daily for the next 10-14 days.  Please call if you have recurrent bleeding.  We will plan on return visit in about 6 weeks for recheck.

## 2023-09-11 DIAGNOSIS — N3946 Mixed incontinence: Secondary | ICD-10-CM | POA: Diagnosis not present

## 2023-09-11 DIAGNOSIS — R350 Frequency of micturition: Secondary | ICD-10-CM | POA: Diagnosis not present

## 2023-10-07 DIAGNOSIS — Z79899 Other long term (current) drug therapy: Secondary | ICD-10-CM | POA: Diagnosis not present

## 2023-10-07 DIAGNOSIS — R29818 Other symptoms and signs involving the nervous system: Secondary | ICD-10-CM | POA: Diagnosis not present

## 2023-10-07 DIAGNOSIS — M0609 Rheumatoid arthritis without rheumatoid factor, multiple sites: Secondary | ICD-10-CM | POA: Diagnosis not present

## 2023-10-07 DIAGNOSIS — M1991 Primary osteoarthritis, unspecified site: Secondary | ICD-10-CM | POA: Diagnosis not present

## 2023-10-07 DIAGNOSIS — Z681 Body mass index (BMI) 19 or less, adult: Secondary | ICD-10-CM | POA: Diagnosis not present

## 2023-10-23 ENCOUNTER — Encounter: Payer: Self-pay | Admitting: Gastroenterology

## 2023-10-23 ENCOUNTER — Ambulatory Visit: Admitting: Gastroenterology

## 2023-10-23 VITALS — BP 147/78 | HR 92 | Temp 98.1°F | Ht 64.0 in | Wt 121.4 lb

## 2023-10-23 DIAGNOSIS — K449 Diaphragmatic hernia without obstruction or gangrene: Secondary | ICD-10-CM

## 2023-10-23 DIAGNOSIS — K862 Cyst of pancreas: Secondary | ICD-10-CM | POA: Diagnosis not present

## 2023-10-23 DIAGNOSIS — K625 Hemorrhage of anus and rectum: Secondary | ICD-10-CM

## 2023-10-23 DIAGNOSIS — D509 Iron deficiency anemia, unspecified: Secondary | ICD-10-CM

## 2023-10-23 DIAGNOSIS — K259 Gastric ulcer, unspecified as acute or chronic, without hemorrhage or perforation: Secondary | ICD-10-CM

## 2023-10-23 DIAGNOSIS — D500 Iron deficiency anemia secondary to blood loss (chronic): Secondary | ICD-10-CM

## 2023-10-23 NOTE — Progress Notes (Addendum)
 GI Office Note    Referring Provider: Lari Elspeth BRAVO, MD Primary Care Physician:  Lari Elspeth BRAVO, MD  Primary Gastroenterologist: Toribio Fortune, MD   Chief Complaint   Chief Complaint  Patient presents with   Follow-up    Doing better    History of Present Illness   Sarah Page is a 83 y.o. female presenting today  for follow up. Last seen in 08/2023 for rectal bleeding. 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   At time of last ov, rectal bleeding suspected to be due to hemorrhoidal and self limiting. Treated with topical therapy.   Today: doing well from GI standpoint. Patient continues to take iron  once daily. Takes pantoprazole  BID. No further episodes of brbpr. No rectal pain. No abdominal pain. BMs regular. Recent labs by rheumatology as outlined. Weight stable.   We discussed prior imaging incidentally found 4mm pancreatic cyst on CT Abd with contrast 12/2021. Reported to be stable and unchanged from MRI abd in 2020. Patient is not interested in pursuing any follow up imaging.   She is also not interested in decreasing PPI to once daily since she is doing so well.   Last EGD: 01/02/2023 - Large paraesophageal hernia.  - Normal stomach.  - Normal examined duodenum.   Last Colonoscopy: 12/26/2021: -External hemorrhoids -Pancolonic diverticulosis -Normal colon s/p biopsy -Benign colon biopsies  Medications   Current Outpatient Medications  Medication Sig Dispense Refill   acetaminophen  (TYLENOL ) 650 MG CR tablet Take 650 mg by mouth every 8 (eight) hours as needed for pain (tylenol  arthritis TID).     amLODipine -olmesartan  (AZOR ) 5-20 MG tablet Take 1 tablet by mouth daily.     Calcium  Carbonate (CALCIUM  500 PO) Take 500 mg by mouth daily.     cholecalciferol  (VITAMIN D3) 25 MCG (1000 UNIT) tablet Take 1,000 Units by mouth daily.      ezetimibe  (ZETIA ) 10 MG tablet Take 10 mg by mouth daily.     ferrous sulfate  325 (65 FE) MG EC tablet Take 325 mg by mouth daily.     hydrocortisone  (ANUSOL -HC) 2.5 % rectal cream Place 1 Application rectally 2 (two) times daily. For 10-14 days 30 g 0   hydroxychloroquine  (PLAQUENIL ) 200 MG tablet Take 200 mg by mouth daily.     leflunomide  (ARAVA ) 10 MG tablet Take 10 mg by mouth daily.     loperamide (IMODIUM A-D) 2 MG tablet Take 2 mg by mouth as needed for diarrhea or loose stools.     Melatonin 10 MG TABS Take 10 mg by mouth at bedtime.     pantoprazole  (PROTONIX ) 40 MG tablet TAKE 1 TABLET BY MOUTH TWICE DAILY 180 tablet 3   sertraline  (ZOLOFT ) 25 MG tablet Take 25 mg by mouth daily.     solifenacin (VESICARE) 5 MG tablet Take 5 mg by mouth daily.     Vibegron (GEMTESA) 75 MG TABS Take 75 mg by mouth daily at 6 (six) AM.     No current facility-administered medications for this visit.    Allergies   Allergies as of 10/23/2023   (No Known Allergies)        Review of Systems   General: Negative for anorexia, weight loss, fever, chills, fatigue, weakness. ENT: Negative for hoarseness, difficulty swallowing , nasal congestion. CV: Negative for chest pain, angina, palpitations, dyspnea on exertion, peripheral edema.  Respiratory: Negative  for dyspnea at rest, dyspnea on exertion, cough, sputum, wheezing.  GI: See history of present illness. GU:  Negative for dysuria, hematuria, urinary incontinence, urinary frequency, nocturnal urination.  Endo: Negative for unusual weight change.     Physical Exam   BP (!) 147/78 (BP Location: Right Arm, Patient Position: Sitting, Cuff Size: Normal)   Pulse 92   Temp 98.1 F (36.7 C) (Oral)   Ht 5' 4 (1.626 m)   Wt 121 lb 6.4 oz (55.1 kg)   SpO2 96%   BMI 20.84 kg/m    General: Well-nourished, well-developed in no acute distress.  Eyes: No icterus. Mouth: Oropharyngeal mucosa moist and pink   Lungs: Clear to auscultation  bilaterally.  Heart: Regular rate and rhythm, no murmurs rubs or gallops.  Abdomen: Bowel sounds are normal, nontender, nondistended, no hepatosplenomegaly or masses,  no abdominal bruits or hernia , no rebound or guarding.  Rectal: not performed Extremities: No lower extremity edema. RA changes in bilateral hands Neuro: Alert and oriented x 4   Skin: Warm and dry, no jaundice.   Psych: Alert and cooperative, normal mood and affect.  Labs   Labs from October 07, 2023: white blood cell count 3600, hemoglobin 12.7, hematocrit 40.2, MCV 98, Plt 234,000, glucose 131, creatinine 1.03, albumin 4.7, total bilirubin 0.6, alk phos 49, AST 39, ALT 26.  Labs from March 2025: Hemoglobin 12.3.  Imaging Studies   No results found.  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 She can have labs done with next labs ordered by her other providers. She would like to consolidate blood draws -return ov prn  Sonny RAMAN. Ezzard, MHS, PA-C [facility]  I have reviewed the note and agree with the APP's assessment as described in this progress note  Toribio Fortune, MD Gastroenterology and Hepatology [facility]

## 2023-10-23 NOTE — Patient Instructions (Signed)
 We will plan on updating your labs within the next four months. I have given you orders today. If you are having labs by another provider, you can request that my labs be drawn as well. We will be checking your hemoglobin and your iron  stores.   Continue pantoprazole  twice daily as discussed.  We will hold of on additional imaging of your pancreas as discussed.  Please let me know if you have any questions or concerns.

## 2023-11-05 DIAGNOSIS — Z6822 Body mass index (BMI) 22.0-22.9, adult: Secondary | ICD-10-CM | POA: Diagnosis not present

## 2023-11-05 DIAGNOSIS — N1831 Chronic kidney disease, stage 3a: Secondary | ICD-10-CM | POA: Diagnosis not present

## 2023-11-05 DIAGNOSIS — R600 Localized edema: Secondary | ICD-10-CM | POA: Diagnosis not present

## 2023-11-05 DIAGNOSIS — E782 Mixed hyperlipidemia: Secondary | ICD-10-CM | POA: Diagnosis not present

## 2023-11-05 DIAGNOSIS — I10 Essential (primary) hypertension: Secondary | ICD-10-CM | POA: Diagnosis not present

## 2024-01-10 DIAGNOSIS — E7849 Other hyperlipidemia: Secondary | ICD-10-CM | POA: Diagnosis not present

## 2024-01-10 DIAGNOSIS — N1831 Chronic kidney disease, stage 3a: Secondary | ICD-10-CM | POA: Diagnosis not present

## 2024-01-10 DIAGNOSIS — E1169 Type 2 diabetes mellitus with other specified complication: Secondary | ICD-10-CM | POA: Diagnosis not present

## 2024-01-13 DIAGNOSIS — M1991 Primary osteoarthritis, unspecified site: Secondary | ICD-10-CM | POA: Diagnosis not present

## 2024-01-13 DIAGNOSIS — Z681 Body mass index (BMI) 19 or less, adult: Secondary | ICD-10-CM | POA: Diagnosis not present

## 2024-01-13 DIAGNOSIS — Z79899 Other long term (current) drug therapy: Secondary | ICD-10-CM | POA: Diagnosis not present

## 2024-01-13 DIAGNOSIS — M0609 Rheumatoid arthritis without rheumatoid factor, multiple sites: Secondary | ICD-10-CM | POA: Diagnosis not present

## 2024-01-17 DIAGNOSIS — R600 Localized edema: Secondary | ICD-10-CM | POA: Diagnosis not present

## 2024-01-17 DIAGNOSIS — I10 Essential (primary) hypertension: Secondary | ICD-10-CM | POA: Diagnosis not present

## 2024-01-17 DIAGNOSIS — E782 Mixed hyperlipidemia: Secondary | ICD-10-CM | POA: Diagnosis not present

## 2024-01-17 DIAGNOSIS — N1831 Chronic kidney disease, stage 3a: Secondary | ICD-10-CM | POA: Diagnosis not present

## 2024-01-17 DIAGNOSIS — Z6822 Body mass index (BMI) 22.0-22.9, adult: Secondary | ICD-10-CM | POA: Diagnosis not present

## 2024-01-17 DIAGNOSIS — E7849 Other hyperlipidemia: Secondary | ICD-10-CM | POA: Diagnosis not present

## 2024-01-17 DIAGNOSIS — Z23 Encounter for immunization: Secondary | ICD-10-CM | POA: Diagnosis not present

## 2024-01-27 ENCOUNTER — Encounter (INDEPENDENT_AMBULATORY_CARE_PROVIDER_SITE_OTHER): Payer: Self-pay | Admitting: Gastroenterology

## 2024-01-27 ENCOUNTER — Ambulatory Visit (INDEPENDENT_AMBULATORY_CARE_PROVIDER_SITE_OTHER): Payer: Medicare PPO | Admitting: Gastroenterology

## 2024-01-27 VITALS — BP 195/99 | HR 68 | Temp 97.8°F | Ht 64.0 in | Wt 119.5 lb

## 2024-01-27 DIAGNOSIS — D509 Iron deficiency anemia, unspecified: Secondary | ICD-10-CM

## 2024-01-27 DIAGNOSIS — K580 Irritable bowel syndrome with diarrhea: Secondary | ICD-10-CM

## 2024-01-27 DIAGNOSIS — K449 Diaphragmatic hernia without obstruction or gangrene: Secondary | ICD-10-CM | POA: Diagnosis not present

## 2024-01-27 DIAGNOSIS — K259 Gastric ulcer, unspecified as acute or chronic, without hemorrhage or perforation: Secondary | ICD-10-CM | POA: Diagnosis not present

## 2024-01-27 NOTE — Patient Instructions (Signed)
 Continue pantoprazole  40 mg twice a day Continue oral iron  daily Perform blood workup Can continue Imodium as needed for episodes of episodic diarrhea

## 2024-01-27 NOTE — Progress Notes (Signed)
 Toribio Fortune, M.D. Gastroenterology & Hepatology [facility] [address] [place], [place] [address]  Primary Care Physician: Lari Elspeth BRAVO, MD [address] [place] [address]  I will communicate my assessment and recommendations to the referring MD via EMR.  Problems: Iron  deficiency anemia secondary to [facility] ulcers IBS History pancreatic cyst, patient not interested in surveillance  History of Present Illness: Lulia Schriner is a 83 y.o. female with past medical history of iron  deficiency anemia secondary to [facility] ulcers, IBS, diverticular bleeding, pancreatic cyst, who presents for follow up of iron  deficiency anemia.  The patient was last seen on 10/23/2023. At that time, the patient was continued on PPI twice daily.  Most recent blood workup from 01/02/2023 showed normal ferritin 139, iron  61, saturation 22%.  Patient states she feels well and denies too many complaints.   May have some episodes of diarrhea, which she reports happen when she is nervous or anxious. This actually resolves after she takes Imodium as needed.  States is very seldom.  She is taking oral iron  once a day and pantoprazole  40 mg BID.  Has been tolerating these medications adequately.  The patient denies having any nausea, vomiting, fever, chills, hematochezia, melena, hematemesis, abdominal distention, abdominal pain, jaundice, pruritus or weight loss.  Last EGD: 01/02/2023 - Large paraesophageal hernia.  - Normal stomach.  - Normal examined duodenum.   Last Colonoscopy: 12/26/2021: -External hemorrhoids -Pancolonic diverticulosis -Normal colon s/p biopsy -Benign colon biopsies  Past Medical History: Past Medical History:  Diagnosis Date   Anemia    Arthritis    Gait disturbance    Rotator cuff tear    DR VERNETTA   Stroke [facility])    Vision abnormalities     Past Surgical History: Past Surgical History:  Procedure Laterality Date    BIOPSY  12/26/2021   Procedure: BIOPSY;  Surgeon: Fortune Angelia Toribio, MD;  [facility];  Service: Gastroenterology;;   BIOPSY  01/04/2022   Procedure: BIOPSY;  Surgeon: Fortune Angelia Toribio, MD;  [facility];  Service: Gastroenterology;;   BIOPSY  06/05/2022   Procedure: BIOPSY;  Surgeon: Fortune Angelia Toribio, MD;  [facility];  Service: Gastroenterology;;   COLONOSCOPY WITH PROPOFOL  N/A 12/26/2021   Procedure: COLONOSCOPY WITH PROPOFOL ;  Surgeon: Fortune Angelia Toribio, MD;  [facility];  Service: Gastroenterology;  Laterality: N/A;  645  ASA 3   ESOPHAGOGASTRODUODENOSCOPY (EGD) WITH PROPOFOL  N/A 01/04/2022   Surgeon: Fortune Angelia, Toribio, MD;  clotted blood at GE junction, esophageal ulcer with stigmata of recent bleeding, 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 Pathology with reactive gastropathy, negative for H. pylori.  Recommended repeating EGD in 3 months   ESOPHAGOGASTRODUODENOSCOPY (EGD) WITH PROPOFOL  N/A 06/05/2022   Procedure: ESOPHAGOGASTRODUODENOSCOPY (EGD) WITH PROPOFOL ;  Surgeon: Fortune Angelia Toribio, MD;  [facility];  Service: Gastroenterology;  Laterality: N/A;  1045am, asa 3, LM to see if pt can come earlier   ESOPHAGOGASTRODUODENOSCOPY (EGD) WITH PROPOFOL  N/A 01/02/2023   Procedure: ESOPHAGOGASTRODUODENOSCOPY (EGD) WITH PROPOFOL ;  Surgeon: Fortune Angelia Toribio, MD;  [facility];  Service: Gastroenterology;  Laterality: N/A;  915am, asa 2   ORIF CLAVICULAR FRACTURE Left     Family History: Family History  Problem Relation Age of Onset   Stroke Mother    Transient ischemic attack Mother  Stroke Father    Heart attack Father    Mental illness Brother    Breast cancer Daughter 75       [name]    Social History: Social History   Tobacco Use   Smoking Status Never   Passive exposure: Never  Smokeless Tobacco Never   Social History   Substance and Sexual Activity  Alcohol  Use Yes   Comment: occasional wine   Social History   Substance and Sexual Activity  Drug Use Never    Allergies: No Known Allergies  Medications: Current Outpatient Medications  Medication Sig Dispense Refill   acetaminophen  (TYLENOL ) 650 MG CR tablet Take 650 mg by mouth every 8 (eight) hours as needed for pain (tylenol  arthritis TID).     amLODipine -olmesartan  (AZOR ) 5-20 MG tablet Take 1 tablet by mouth daily.     Calcium  Carbonate (CALCIUM  500 PO) Take 500 mg by mouth daily.     cholecalciferol  (VITAMIN D3) 25 MCG (1000 UNIT) tablet Take 1,000 Units by mouth daily.     ezetimibe  (ZETIA ) 10 MG tablet Take 10 mg by mouth daily.     ferrous sulfate  325 (65 FE) MG EC tablet Take 325 mg by mouth daily.     hydrocortisone  (ANUSOL -HC) 2.5 % rectal cream Place 1 Application rectally 2 (two) times daily. For 10-14 days (Patient taking differently: Place 1 Application rectally as needed. For 10-14 days) 30 g 0   hydroxychloroquine  (PLAQUENIL ) 200 MG tablet Take 200 mg by mouth daily.     leflunomide  (ARAVA ) 10 MG tablet Take 10 mg by mouth daily.     loperamide (IMODIUM A-D) 2 MG tablet Take 2 mg by mouth as needed for diarrhea or loose stools.     Melatonin 10 MG TABS Take 10 mg by mouth at bedtime.     pantoprazole  (PROTONIX ) 40 MG tablet TAKE 1 TABLET BY MOUTH TWICE DAILY 180 tablet 3   sertraline  (ZOLOFT ) 25 MG tablet Take 25 mg by mouth daily.     solifenacin (VESICARE) 5 MG tablet Take 5 mg by mouth daily.     Vibegron (GEMTESA) 75 MG TABS Take 75 mg by mouth daily at 6 (six) AM.     No current facility-administered medications for this visit.    Review of Systems: GENERAL: negative for malaise, night sweats HEENT: No changes in hearing or vision, no nose bleeds or other nasal problems. NECK: Negative for lumps, goiter, pain and significant  neck swelling RESPIRATORY: Negative for cough, wheezing CARDIOVASCULAR: Negative for chest pain, leg swelling, palpitations, orthopnea GI: SEE HPI MUSCULOSKELETAL: Negative for joint pain or swelling, back pain, and muscle pain. SKIN: Negative for lesions, rash PSYCH: Negative for sleep disturbance, mood disorder and recent psychosocial stressors. HEMATOLOGY Negative for prolonged bleeding, bruising easily, and swollen nodes. ENDOCRINE: Negative for cold or heat intolerance, polyuria, polydipsia and goiter. NEURO: negative for tremor, gait imbalance, syncope and seizures. The remainder of the review of systems is noncontributory.   Physical Exam: BP (!) 195/99 (BP Location: Left Arm, Patient Position: Sitting, Cuff Size: Normal)   Pulse 68   Temp 97.8 F (36.6 C) (Temporal)   Ht 5' 4 (1.626 m)   Wt 119 lb 8 oz (54.2 kg)   BMI 20.51 kg/m  GENERAL: The patient is AO x3, in no acute distress. HEENT: Head is normocephalic and atraumatic. EOMI are intact. Mouth is well hydrated and without lesions. NECK: Supple. No masses LUNGS: Clear to auscultation. No presence of rhonchi/wheezing/rales. Adequate chest expansion HEART:  RRR, normal s1 and s2. ABDOMEN: Soft, nontender, no guarding, no peritoneal signs, and nondistended. BS +. No masses. EXTREMITIES: Without any cyanosis, clubbing, rash, lesions or edema. NEUROLOGIC: AOx3, no focal motor deficit. SKIN: no jaundice, no rashes  Imaging/Labs: as above  I personally reviewed and interpreted the available labs, imaging and endoscopic files.  Impression and Plan: Annet Manukyan is a 83 y.o. female with past medical history of iron  deficiency anemia secondary to [facility] ulcers, IBS, diverticular bleeding, pancreatic cyst, who presents for follow up of iron  deficiency anemia.  The patient has presented a history of iron  deficiency anemia with prior endoscopic investigations showing presence of a large hiatal hernia with Cameron ulcers.   It appears that this has responded to the management with PPI and iron  supplementation, which the patient has tolerated adequately.  At this point, as she wants to avoid surgery, we will continue with high-dose PPI twice a day and ferrous sulfate  325 mg daily.  Will also check her current iron  stores and response to oral iron  supplementation with blood workup.  -Continue pantoprazole  40 mg twice a day -Continue oral iron  daily -Check CBC and iron  stores -Can continue Imodium as needed for episodes of episodic diarrhea  All questions were answered.      Toribio Fortune, MD Gastroenterology and Hepatology [facility]

## 2024-01-28 ENCOUNTER — Ambulatory Visit (INDEPENDENT_AMBULATORY_CARE_PROVIDER_SITE_OTHER): Payer: Self-pay | Admitting: Gastroenterology

## 2024-01-28 LAB — CBC WITH DIFFERENTIAL/PLATELET
Absolute Lymphocytes: 574 {cells}/uL — ABNORMAL LOW (ref 850–3900)
Absolute Monocytes: 406 {cells}/uL (ref 200–950)
Basophils Absolute: 90 {cells}/uL (ref 0–200)
Basophils Relative: 2.2 %
Eosinophils Absolute: 70 {cells}/uL (ref 15–500)
Eosinophils Relative: 1.7 %
HCT: 41 % (ref 35.0–45.0)
Hemoglobin: 13.3 g/dL (ref 11.7–15.5)
MCH: 31.1 pg (ref 27.0–33.0)
MCHC: 32.4 g/dL (ref 32.0–36.0)
MCV: 95.8 fL (ref 80.0–100.0)
MPV: 11.6 fL (ref 7.5–12.5)
Monocytes Relative: 9.9 %
Neutro Abs: 2960 {cells}/uL (ref 1500–7800)
Neutrophils Relative %: 72.2 %
Platelets: 243 Thousand/uL (ref 140–400)
RBC: 4.28 Million/uL (ref 3.80–5.10)
RDW: 11.7 % (ref 11.0–15.0)
Total Lymphocyte: 14 %
WBC: 4.1 Thousand/uL (ref 3.8–10.8)

## 2024-01-28 LAB — IRON,TIBC AND FERRITIN PANEL
%SAT: 33 % (ref 16–45)
Ferritin: 221 ng/mL (ref 16–288)
Iron: 104 ug/dL (ref 45–160)
TIBC: 313 ug/dL (ref 250–450)

## 2024-02-05 ENCOUNTER — Encounter (INDEPENDENT_AMBULATORY_CARE_PROVIDER_SITE_OTHER): Payer: Self-pay | Admitting: Gastroenterology

## 2024-03-05 DIAGNOSIS — H04123 Dry eye syndrome of bilateral lacrimal glands: Secondary | ICD-10-CM | POA: Diagnosis not present

## 2024-03-05 DIAGNOSIS — H2512 Age-related nuclear cataract, left eye: Secondary | ICD-10-CM | POA: Diagnosis not present

## 2024-03-05 DIAGNOSIS — H524 Presbyopia: Secondary | ICD-10-CM | POA: Diagnosis not present

## 2024-03-10 ENCOUNTER — Other Ambulatory Visit (HOSPITAL_COMMUNITY): Payer: Self-pay

## 2024-03-10 DIAGNOSIS — N3281 Overactive bladder: Secondary | ICD-10-CM | POA: Diagnosis not present

## 2024-03-10 DIAGNOSIS — I129 Hypertensive chronic kidney disease with stage 1 through stage 4 chronic kidney disease, or unspecified chronic kidney disease: Secondary | ICD-10-CM | POA: Diagnosis not present

## 2024-03-10 DIAGNOSIS — R7301 Impaired fasting glucose: Secondary | ICD-10-CM | POA: Diagnosis not present

## 2024-03-10 DIAGNOSIS — N1831 Chronic kidney disease, stage 3a: Secondary | ICD-10-CM | POA: Diagnosis not present

## 2024-03-10 DIAGNOSIS — E782 Mixed hyperlipidemia: Secondary | ICD-10-CM | POA: Diagnosis not present

## 2024-03-10 DIAGNOSIS — F330 Major depressive disorder, recurrent, mild: Secondary | ICD-10-CM | POA: Diagnosis not present

## 2024-03-10 DIAGNOSIS — M419 Scoliosis, unspecified: Secondary | ICD-10-CM | POA: Diagnosis not present

## 2024-03-10 DIAGNOSIS — K580 Irritable bowel syndrome with diarrhea: Secondary | ICD-10-CM | POA: Diagnosis not present

## 2024-03-10 MED ORDER — COVID-19 MRNA VAC-TRIS(PFIZER) 30 MCG/0.3ML IM SUSY
0.3000 mL | PREFILLED_SYRINGE | Freq: Once | INTRAMUSCULAR | 0 refills | Status: AC
  Filled 2024-03-10: qty 0.3, 1d supply, fill #0

## 2024-03-11 ENCOUNTER — Other Ambulatory Visit (HOSPITAL_COMMUNITY): Payer: Self-pay

## 2024-03-11 MED ORDER — SOLIFENACIN SUCCINATE 5 MG PO TABS
5.0000 mg | ORAL_TABLET | Freq: Every day | ORAL | 3 refills | Status: AC
  Filled 2024-03-12 – 2024-03-23 (×2): qty 30, 30d supply, fill #0
  Filled 2024-04-26 – 2024-04-27 (×2): qty 30, 30d supply, fill #1
  Filled 2024-05-24 – 2024-05-25 (×2): qty 30, 30d supply, fill #2

## 2024-03-11 MED ORDER — SERTRALINE HCL 25 MG PO TABS
25.0000 mg | ORAL_TABLET | Freq: Every day | ORAL | 3 refills | Status: AC
  Filled 2024-03-12 – 2024-03-23 (×2): qty 30, 30d supply, fill #0
  Filled 2024-04-26 – 2024-04-27 (×2): qty 30, 30d supply, fill #1
  Filled 2024-05-24 – 2024-05-25 (×2): qty 30, 30d supply, fill #2

## 2024-03-11 MED ORDER — GEMTESA 75 MG PO TABS
75.0000 mg | ORAL_TABLET | Freq: Every day | ORAL | 3 refills | Status: AC
  Filled 2024-03-12 – 2024-03-23 (×2): qty 30, 30d supply, fill #0
  Filled 2024-04-26 – 2024-04-27 (×2): qty 30, 30d supply, fill #1
  Filled 2024-05-24 – 2024-05-25 (×2): qty 30, 30d supply, fill #2

## 2024-03-11 MED ORDER — OLMESARTAN MEDOXOMIL 20 MG PO TABS
20.0000 mg | ORAL_TABLET | Freq: Every day | ORAL | 3 refills | Status: AC
  Filled 2024-03-12 – 2024-03-23 (×2): qty 30, 30d supply, fill #0
  Filled 2024-04-26 – 2024-04-27 (×2): qty 30, 30d supply, fill #1
  Filled 2024-05-24 – 2024-05-25 (×2): qty 30, 30d supply, fill #2

## 2024-03-11 MED ORDER — HYDROXYCHLOROQUINE SULFATE 200 MG PO TABS
200.0000 mg | ORAL_TABLET | Freq: Every day | ORAL | 4 refills | Status: AC
  Filled 2024-03-12 – 2024-03-23 (×2): qty 30, 30d supply, fill #0
  Filled 2024-04-26 – 2024-04-27 (×2): qty 30, 30d supply, fill #1
  Filled 2024-05-24 – 2024-05-25 (×2): qty 30, 30d supply, fill #2

## 2024-03-12 ENCOUNTER — Other Ambulatory Visit: Payer: Self-pay

## 2024-03-12 MED FILL — Pantoprazole Sodium EC Tab 40 MG (Base Equiv): ORAL | 30 days supply | Qty: 60 | Fill #0 | Status: CN

## 2024-03-16 ENCOUNTER — Other Ambulatory Visit: Payer: Self-pay

## 2024-03-23 ENCOUNTER — Other Ambulatory Visit (HOSPITAL_COMMUNITY): Payer: Self-pay

## 2024-03-23 ENCOUNTER — Other Ambulatory Visit: Payer: Self-pay

## 2024-03-23 MED ORDER — EZETIMIBE 10 MG PO TABS
10.0000 mg | ORAL_TABLET | Freq: Every day | ORAL | 3 refills | Status: AC
  Filled 2024-03-23: qty 90, 90d supply, fill #0
  Filled 2024-03-23: qty 30, 30d supply, fill #0
  Filled 2024-04-26 – 2024-04-27 (×2): qty 30, 30d supply, fill #1
  Filled 2024-05-24 – 2024-05-25 (×2): qty 30, 30d supply, fill #2

## 2024-03-23 MED FILL — Pantoprazole Sodium EC Tab 40 MG (Base Equiv): ORAL | 30 days supply | Qty: 60 | Fill #0 | Status: AC

## 2024-03-24 ENCOUNTER — Other Ambulatory Visit (HOSPITAL_COMMUNITY): Payer: Self-pay

## 2024-03-24 MED ORDER — LEFLUNOMIDE 10 MG PO TABS
10.0000 mg | ORAL_TABLET | Freq: Every day | ORAL | 0 refills | Status: AC
  Filled 2024-03-24 – 2024-04-26 (×3): qty 90, 90d supply, fill #0

## 2024-03-25 ENCOUNTER — Other Ambulatory Visit (HOSPITAL_COMMUNITY): Payer: Self-pay

## 2024-04-01 ENCOUNTER — Other Ambulatory Visit (HOSPITAL_COMMUNITY): Payer: Self-pay

## 2024-04-01 ENCOUNTER — Other Ambulatory Visit: Payer: Self-pay

## 2024-04-01 MED ORDER — CHLORTHALIDONE 12.5 MG PO TABS
12.5000 mg | ORAL_TABLET | Freq: Every day | ORAL | 0 refills | Status: DC
  Filled 2024-04-01 – 2024-05-02 (×5): qty 30, 30d supply, fill #0
  Filled 2024-05-04 (×2): qty 30, 60d supply, fill #0

## 2024-04-01 MED ORDER — HEMICLOR 12.5 MG PO TABS
12.5000 mg | ORAL_TABLET | Freq: Every morning | ORAL | 0 refills | Status: AC
  Filled 2024-04-01 (×2): qty 30, 30d supply, fill #0

## 2024-04-02 ENCOUNTER — Other Ambulatory Visit (HOSPITAL_COMMUNITY): Payer: Self-pay

## 2024-04-02 ENCOUNTER — Other Ambulatory Visit: Payer: Self-pay

## 2024-04-23 ENCOUNTER — Encounter: Payer: Self-pay | Admitting: Gastroenterology

## 2024-04-26 MED FILL — Pantoprazole Sodium EC Tab 40 MG (Base Equiv): ORAL | 30 days supply | Qty: 60 | Fill #1 | Status: CN

## 2024-04-27 ENCOUNTER — Other Ambulatory Visit: Payer: Self-pay

## 2024-04-27 MED FILL — Pantoprazole Sodium EC Tab 40 MG (Base Equiv): ORAL | 30 days supply | Qty: 60 | Fill #1 | Status: AC

## 2024-04-28 ENCOUNTER — Other Ambulatory Visit: Payer: Self-pay

## 2024-04-30 ENCOUNTER — Other Ambulatory Visit: Payer: Self-pay

## 2024-05-01 ENCOUNTER — Other Ambulatory Visit: Payer: Self-pay

## 2024-05-02 ENCOUNTER — Other Ambulatory Visit (HOSPITAL_COMMUNITY): Payer: Self-pay

## 2024-05-03 ENCOUNTER — Other Ambulatory Visit: Payer: Self-pay

## 2024-05-04 ENCOUNTER — Other Ambulatory Visit (HOSPITAL_COMMUNITY): Payer: Self-pay

## 2024-05-04 ENCOUNTER — Other Ambulatory Visit: Payer: Self-pay

## 2024-05-24 ENCOUNTER — Other Ambulatory Visit: Payer: Self-pay

## 2024-05-24 MED FILL — Pantoprazole Sodium EC Tab 40 MG (Base Equiv): ORAL | 30 days supply | Qty: 60 | Fill #2 | Status: CN

## 2024-05-25 ENCOUNTER — Other Ambulatory Visit: Payer: Self-pay

## 2024-05-25 MED FILL — Pantoprazole Sodium EC Tab 40 MG (Base Equiv): ORAL | 30 days supply | Qty: 60 | Fill #2 | Status: AC

## 2024-05-26 ENCOUNTER — Other Ambulatory Visit (HOSPITAL_COMMUNITY): Payer: Self-pay

## 2024-05-26 ENCOUNTER — Other Ambulatory Visit: Payer: Self-pay

## 2024-05-26 MED ORDER — HEMICLOR 12.5 MG PO TABS
2.0000 | ORAL_TABLET | ORAL | 4 refills | Status: AC
  Filled 2024-05-26: qty 60, 30d supply, fill #0
